# Patient Record
Sex: Male | Born: 1955 | Race: White | Hispanic: No | Smoking: Former smoker
Health system: Southern US, Community
[De-identification: ages and names within clinical notes are randomized; demographics above are authoritative.]

## PROBLEM LIST (undated history)

## (undated) DIAGNOSIS — N189 Chronic kidney disease, unspecified: Secondary | ICD-10-CM

## (undated) DIAGNOSIS — N529 Male erectile dysfunction, unspecified: Secondary | ICD-10-CM

## (undated) DIAGNOSIS — E039 Hypothyroidism, unspecified: Secondary | ICD-10-CM

## (undated) DIAGNOSIS — F32A Depression, unspecified: Secondary | ICD-10-CM

## (undated) DIAGNOSIS — T7840XA Allergy, unspecified, initial encounter: Secondary | ICD-10-CM

## (undated) DIAGNOSIS — F329 Major depressive disorder, single episode, unspecified: Secondary | ICD-10-CM

## (undated) DIAGNOSIS — Z9889 Other specified postprocedural states: Secondary | ICD-10-CM

## (undated) DIAGNOSIS — I10 Essential (primary) hypertension: Secondary | ICD-10-CM

## (undated) DIAGNOSIS — F419 Anxiety disorder, unspecified: Secondary | ICD-10-CM

## (undated) DIAGNOSIS — R011 Cardiac murmur, unspecified: Secondary | ICD-10-CM

## (undated) DIAGNOSIS — Z87442 Personal history of urinary calculi: Secondary | ICD-10-CM

## (undated) DIAGNOSIS — E785 Hyperlipidemia, unspecified: Secondary | ICD-10-CM

## (undated) DIAGNOSIS — A630 Anogenital (venereal) warts: Secondary | ICD-10-CM

## (undated) DIAGNOSIS — R112 Nausea with vomiting, unspecified: Secondary | ICD-10-CM

## (undated) DIAGNOSIS — K219 Gastro-esophageal reflux disease without esophagitis: Secondary | ICD-10-CM

## (undated) HISTORY — PX: TONSILLECTOMY: SUR1361

## (undated) HISTORY — DX: Chronic kidney disease, unspecified: N18.9

## (undated) HISTORY — DX: Allergy, unspecified, initial encounter: T78.40XA

## (undated) HISTORY — DX: Essential (primary) hypertension: I10

## (undated) HISTORY — DX: Gastro-esophageal reflux disease without esophagitis: K21.9

## (undated) HISTORY — DX: Anogenital (venereal) warts: A63.0

## (undated) HISTORY — DX: Male erectile dysfunction, unspecified: N52.9

## (undated) HISTORY — PX: APPENDECTOMY: SHX54

## (undated) HISTORY — DX: Major depressive disorder, single episode, unspecified: F32.9

## (undated) HISTORY — DX: Depression, unspecified: F32.A

## (undated) HISTORY — DX: Hyperlipidemia, unspecified: E78.5

## (undated) HISTORY — DX: Anxiety disorder, unspecified: F41.9

## (undated) HISTORY — PX: KNEE ARTHROSCOPY: SUR90

## (undated) HISTORY — PX: CYSTECTOMY: SUR359

---
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
## 1988-09-22 HISTORY — PX: WISDOM TOOTH EXTRACTION: SHX21

## 1991-09-23 HISTORY — PX: KNEE ARTHROSCOPY: SUR90

## 2003-04-07 ENCOUNTER — Emergency Department (HOSPITAL_COMMUNITY): Admission: EM | Admit: 2003-04-07 | Discharge: 2003-04-08 | Payer: Self-pay | Admitting: Emergency Medicine

## 2004-07-30 ENCOUNTER — Ambulatory Visit: Payer: Self-pay | Admitting: Family Medicine

## 2004-09-02 ENCOUNTER — Ambulatory Visit: Payer: Self-pay | Admitting: Family Medicine

## 2004-12-24 ENCOUNTER — Ambulatory Visit: Payer: Self-pay | Admitting: Family Medicine

## 2005-01-06 ENCOUNTER — Ambulatory Visit: Payer: Self-pay | Admitting: Family Medicine

## 2005-09-18 ENCOUNTER — Ambulatory Visit: Payer: Self-pay | Admitting: Family Medicine

## 2005-10-08 ENCOUNTER — Ambulatory Visit: Payer: Self-pay | Admitting: Family Medicine

## 2005-10-17 ENCOUNTER — Ambulatory Visit: Payer: Self-pay | Admitting: Family Medicine

## 2005-11-07 ENCOUNTER — Ambulatory Visit: Payer: Self-pay | Admitting: Family Medicine

## 2005-11-12 ENCOUNTER — Ambulatory Visit: Payer: Self-pay | Admitting: Family Medicine

## 2005-12-19 ENCOUNTER — Ambulatory Visit: Payer: Self-pay | Admitting: Internal Medicine

## 2006-01-23 ENCOUNTER — Ambulatory Visit: Payer: Self-pay | Admitting: Family Medicine

## 2006-02-17 ENCOUNTER — Ambulatory Visit: Payer: Self-pay | Admitting: Family Medicine

## 2007-06-02 DIAGNOSIS — F321 Major depressive disorder, single episode, moderate: Secondary | ICD-10-CM

## 2007-06-22 ENCOUNTER — Ambulatory Visit: Payer: Self-pay | Admitting: Family Medicine

## 2007-09-03 ENCOUNTER — Ambulatory Visit: Payer: Self-pay | Admitting: Family Medicine

## 2007-09-03 LAB — CONVERTED CEMR LAB
Bilirubin Urine: NEGATIVE
Blood in Urine, dipstick: NEGATIVE
Glucose, Urine, Semiquant: NEGATIVE
Ketones, urine, test strip: NEGATIVE
Nitrite: NEGATIVE
Protein, U semiquant: NEGATIVE
Specific Gravity, Urine: 1.02
Urobilinogen, UA: 0.2
WBC Urine, dipstick: NEGATIVE
pH: 7

## 2007-09-07 LAB — CONVERTED CEMR LAB
ALT: 24 units/L (ref 0–53)
AST: 25 units/L (ref 0–37)
Albumin: 4 g/dL (ref 3.5–5.2)
Alkaline Phosphatase: 59 units/L (ref 39–117)
BUN: 10 mg/dL (ref 6–23)
Basophils Absolute: 0 10*3/uL (ref 0.0–0.1)
Basophils Relative: 0.3 % (ref 0.0–1.0)
Bilirubin, Direct: 0.2 mg/dL (ref 0.0–0.3)
CO2: 30 meq/L (ref 19–32)
Calcium: 9.4 mg/dL (ref 8.4–10.5)
Chloride: 106 meq/L (ref 96–112)
Cholesterol: 213 mg/dL (ref 0–200)
Creatinine, Ser: 0.8 mg/dL (ref 0.4–1.5)
Direct LDL: 138.1 mg/dL
Eosinophils Absolute: 0.2 10*3/uL (ref 0.0–0.6)
Eosinophils Relative: 2.4 % (ref 0.0–5.0)
GFR calc Af Amer: 131 mL/min
GFR calc non Af Amer: 108 mL/min
Glucose, Bld: 100 mg/dL — ABNORMAL HIGH (ref 70–99)
HCT: 40.1 % (ref 39.0–52.0)
HDL: 45.4 mg/dL (ref >39.0)
Hemoglobin: 13.9 g/dL (ref 13.0–17.0)
Lymphocytes Relative: 33.6 % (ref 12.0–46.0)
MCHC: 34.8 g/dL (ref 30.0–36.0)
MCV: 96.2 fL (ref 78.0–100.0)
Monocytes Absolute: 0.4 10*3/uL (ref 0.2–0.7)
Monocytes Relative: 5.6 % (ref 3.0–11.0)
Neutro Abs: 4.2 10*3/uL (ref 1.4–7.7)
Neutrophils Relative %: 58.1 % (ref 43.0–77.0)
PSA: 1.36 ng/mL (ref 0.10–4.00)
Platelets: 285 10*3/uL (ref 150–400)
Potassium: 5.3 meq/L — ABNORMAL HIGH (ref 3.5–5.1)
RBC: 4.16 M/uL — ABNORMAL LOW (ref 4.22–5.81)
RDW: 12.7 % (ref 11.5–14.6)
Sodium: 143 meq/L (ref 135–145)
TSH: 1.26 microintl units/mL (ref 0.35–5.50)
Total Bilirubin: 1.2 mg/dL (ref 0.3–1.2)
Total CHOL/HDL Ratio: 4.7
Total Protein: 6.7 g/dL (ref 6.0–8.3)
Triglycerides: 99 mg/dL (ref 0–149)
VLDL: 20 mg/dL (ref 0–40)
WBC: 7.3 10*3/uL (ref 4.5–10.5)

## 2007-09-21 ENCOUNTER — Ambulatory Visit: Payer: Self-pay | Admitting: Family Medicine

## 2007-10-25 ENCOUNTER — Ambulatory Visit: Payer: Self-pay | Admitting: Gastroenterology

## 2007-11-08 ENCOUNTER — Encounter: Payer: Self-pay | Admitting: Family Medicine

## 2007-11-08 ENCOUNTER — Ambulatory Visit: Payer: Self-pay | Admitting: Gastroenterology

## 2007-11-08 HISTORY — PX: COLONOSCOPY: SHX174

## 2008-05-05 ENCOUNTER — Ambulatory Visit: Payer: Self-pay | Admitting: Family Medicine

## 2008-05-05 DIAGNOSIS — F411 Generalized anxiety disorder: Secondary | ICD-10-CM

## 2008-09-23 ENCOUNTER — Emergency Department (HOSPITAL_COMMUNITY): Admission: EM | Admit: 2008-09-23 | Discharge: 2008-09-24 | Payer: Self-pay | Admitting: Emergency Medicine

## 2008-09-23 ENCOUNTER — Telehealth: Payer: Self-pay | Admitting: Internal Medicine

## 2008-09-25 ENCOUNTER — Ambulatory Visit: Payer: Self-pay | Admitting: Family Medicine

## 2008-09-25 DIAGNOSIS — I10 Essential (primary) hypertension: Secondary | ICD-10-CM | POA: Insufficient documentation

## 2009-05-04 ENCOUNTER — Ambulatory Visit: Payer: Self-pay | Admitting: Family Medicine

## 2009-05-15 ENCOUNTER — Telehealth: Payer: Self-pay | Admitting: Family Medicine

## 2009-11-01 ENCOUNTER — Ambulatory Visit: Payer: Self-pay | Admitting: Internal Medicine

## 2010-01-01 ENCOUNTER — Emergency Department (HOSPITAL_COMMUNITY): Admission: EM | Admit: 2010-01-01 | Discharge: 2010-01-01 | Payer: Self-pay | Admitting: Emergency Medicine

## 2010-02-25 ENCOUNTER — Ambulatory Visit: Payer: Self-pay | Admitting: Internal Medicine

## 2010-02-28 ENCOUNTER — Telehealth: Payer: Self-pay | Admitting: Internal Medicine

## 2010-05-30 ENCOUNTER — Ambulatory Visit: Payer: Self-pay | Admitting: Family Medicine

## 2010-05-30 DIAGNOSIS — K219 Gastro-esophageal reflux disease without esophagitis: Secondary | ICD-10-CM | POA: Insufficient documentation

## 2010-05-30 DIAGNOSIS — L408 Other psoriasis: Secondary | ICD-10-CM | POA: Insufficient documentation

## 2010-09-17 ENCOUNTER — Encounter: Payer: Self-pay | Admitting: Family Medicine

## 2010-10-03 ENCOUNTER — Telehealth: Payer: Self-pay | Admitting: Family Medicine

## 2010-10-22 NOTE — Assessment & Plan Note (Signed)
Summary: congestion/?flu/njr   Vital Signs:  Patient profile:   55 year old male O2 Sat:      97 % on Room air Temp:     98.9 degrees F oral BP sitting:   140 / 86  (right arm) Cuff size:   regular  Vitals Entered By: Duard Brady LPN (November 01, 2009 2:41 PM)  O2 Flow:  Room air CC: c/o troat and nose congestion, sore throat , roomate seen 10 days ago by Dr. Clent Ridges - tx for flu   CC:  c/o troat and nose congestion, sore throat , and roomate seen 10 days ago by Dr. Clent Ridges - tx for flu.  History of Present Illness: 55 year old patient is seen today with a 3 week illness.  His roommate has been evaluated and treated for a flulike illness.  He was much sicker about two weeks ago, but presently has had some residual sore throat, chest and sinus congestion and hoarseness.  Denies any fever, productive cough, shortness of breath or chest pain.  He is on Flonase for allergic rhinitis. he has a history of borderline hypertension, presently controlled off medication  Allergies: No Known Drug Allergies  Past History:  Past Medical History: Reviewed history from 05/04/2009 and no changes required. Heart Murmur, resolved as a child Depression Allergies genital warts, saw Dr. Terri Piedra [facility] Hypertension ED  Review of Systems       The patient complains of anorexia and prolonged cough.  The patient denies fever, weight gain, vision loss, decreased hearing, hoarseness, chest pain, syncope, dyspnea on exertion, peripheral edema, headaches, hemoptysis, abdominal pain, melena, hematochezia, severe indigestion/heartburn, hematuria, incontinence, genital sores, muscle weakness, suspicious skin lesions, transient blindness, difficulty walking, depression, unusual weight change, abnormal bleeding, enlarged lymph nodes, angioedema, breast masses, and testicular masses.    Physical Exam  General:  Well-developed,well-nourished,in no acute distress; alert,appropriate and cooperative throughout  examination Head:  Normocephalic and atraumatic without obvious abnormalities. No apparent alopecia or balding. Eyes:  No corneal or conjunctival inflammation noted. EOMI. Perrla. Funduscopic exam benign, without hemorrhages, exudates or papilledema. Vision grossly normal. Ears:  External ear exam shows no significant lesions or deformities.  Otoscopic examination reveals clear canals, tympanic membranes are intact bilaterally without bulging, retraction, inflammation or discharge. Hearing is grossly normal bilaterally. Nose:  External nasal examination shows no deformity or inflammation. Nasal mucosa are pink and moist without lesions or exudates. Mouth:  Oral mucosa and oropharynx without lesions or exudates.  Teeth in good repair. Neck:  No deformities, masses, or tenderness noted. Lungs:  Normal respiratory effort, chest expands symmetrically. Lungs are clear to auscultation, no crackles or wheezes. Heart:  Normal rate and regular rhythm. S1 and S2 normal without gallop, murmur, click, rub or other extra sounds.   Impression & Recommendations:  Problem # 1:  URI (ICD-465.9)  His updated medication list for this problem includes:    Diclofenac Sodium 50 Mg Tbec (Diclofenac sodium) .Marland Kitchen... Three times a day as needed pain  Problem # 2:  HYPERTENSION (ICD-401.9)  Complete Medication List: 1)  Flonase 50 Mcg/act Susp (Fluticasone propionate) .... Once daily 2)  Diclofenac Sodium 50 Mg Tbec (Diclofenac sodium) .... Three times a day as needed pain 3)  Alprazolam 1 Mg Tbdp (Alprazolam) .Marland Kitchen.. 1 every 6 hours 4)  Cialis 20 Mg Tabs (Tadalafil) .... As needed  Patient Instructions: 1)  Get plenty of rest, drink lots of clear liquids, and use Tylenol or Ibuprofen for fever and comfort. Return in 7-10 days if  you're not better:sooner if you're feeling worse. Prescriptions: CIALIS 20 MG TABS (TADALAFIL) as needed  #10 x 11   Entered and Authorized by:   Gordy Savers  MD   Signed by:   Gordy Savers  MD on 11/01/2009   Method used:   Print then Give to Patient   RxID:   [id number] ALPRAZOLAM 1 MG  TBDP (ALPRAZOLAM) 1 every 6 hours  #60 x 5   Entered and Authorized by:   Gordy Savers  MD   Signed by:   Gordy Savers  MD on 11/01/2009   Method used:   Print then Give to Patient   RxID:   [id number] DICLOFENAC SODIUM 50 MG  TBEC (DICLOFENAC SODIUM) three times a day as needed pain  #90 x 3   Entered and Authorized by:   Gordy Savers  MD   Signed by:   Gordy Savers  MD on 11/01/2009   Method used:   Print then Give to Patient   RxID:   [id number] FLONASE 50 MCG/ACT  SUSP (FLUTICASONE PROPIONATE) once daily  #3 x 3   Entered and Authorized by:   Gordy Savers  MD   Signed by:   Gordy Savers  MD on 11/01/2009   Method used:   Print then Give to Patient   RxID:   [id number]

## 2010-10-22 NOTE — Assessment & Plan Note (Signed)
Summary: sinuses//ccm   Vital Signs:  Patient profile:   55 year old male O2 Sat:      98 % Temp:     98.8 degrees F Pulse rate:   68 / minute BP sitting:   130 / 92  (left arm) Cuff size:   regular  Vitals Entered By: Pura Spice, RN (May 30, 2010 11:15 AM) CC: sinus inf stuffy nose throat scratchy raw.   Contraindications/Deferment of Procedures/Staging:    Test/Procedure: Weight Refused    Reason for deferment: patient declined-cannot calculate BMI   History of Present Illness: Here for several reasons. First, for several days he has had sinus pressure, PND, ST, and a dry cough. No fever. second, he has frequent GERD and uses OTC meds like Prilosec. These do not help very much.  Third, he has psoriasis in his scalp, and it has started to show up on his forehead with itchy scales.   Allergies (verified): No Known Drug Allergies  Past History:  Past Medical History: Reviewed history from 05/04/2009 and no changes required. Heart Murmur, resolved as a child Depression Allergies genital warts, saw Dr. Terri Piedra Anxiety Hypertension ED  Review of Systems  The patient denies anorexia, fever, weight loss, weight gain, vision loss, decreased hearing, hoarseness, chest pain, syncope, dyspnea on exertion, peripheral edema, hemoptysis, abdominal pain, melena, hematochezia, hematuria, incontinence, genital sores, muscle weakness, suspicious skin lesions, transient blindness, difficulty walking, depression, unusual weight change, abnormal bleeding, enlarged lymph nodes, angioedema, breast masses, and testicular masses.    Physical Exam  General:  Well-developed,well-nourished,in no acute distress; alert,appropriate and cooperative throughout examination Head:  Normocephalic and atraumatic without obvious abnormalities. No apparent alopecia or balding. Eyes:  No corneal or conjunctival inflammation noted. EOMI. Perrla. Funduscopic exam benign, without hemorrhages, exudates or  papilledema. Vision grossly normal. Ears:  External ear exam shows no significant lesions or deformities.  Otoscopic examination reveals clear canals, tympanic membranes are intact bilaterally without bulging, retraction, inflammation or discharge. Hearing is grossly normal bilaterally. Nose:  External nasal examination shows no deformity or inflammation. Nasal mucosa are pink and moist without lesions or exudates. Mouth:  Oral mucosa and oropharynx without lesions or exudates.  Teeth in good repair. Neck:  No deformities, masses, or tenderness noted. Lungs:  Normal respiratory effort, chest expands symmetrically. Lungs are clear to auscultation, no crackles or wheezes. Abdomen:  Bowel sounds positive,abdomen soft and non-tender without masses, organomegaly or hernias noted. Skin:  scaly pink patches over the forehead   Impression & Recommendations:  Problem # 1:  ACUTE SINUSITIS, UNSPECIFIED (ICD-461.9)  His updated medication list for this problem includes:    Flonase 50 Mcg/act Susp (Fluticasone propionate) ..... Once daily    Zithromax Z-pak 250 Mg Tabs (Azithromycin) .Marland Kitchen... As directed  Problem # 2:  PSORIASIS (ICD-696.1)  Problem # 3:  GERD (ICD-530.81)  His updated medication list for this problem includes:    Omeprazole 40 Mg Cpdr (Omeprazole) ..... Once daily  Complete Medication List: 1)  Flonase 50 Mcg/act Susp (Fluticasone propionate) .... Once daily 2)  Diclofenac Sodium 50 Mg Tbec (Diclofenac sodium) .... Three times a day as needed pain 3)  Alprazolam 1 Mg Tbdp (Alprazolam) .Marland Kitchen.. 1 every 6 hours 4)  Cialis 20 Mg Tabs (Tadalafil) .... As needed 5)  Trazodone Hcl 50 Mg Tabs (Trazodone hcl) .... At bedtime 6)  Zithromax Z-pak 250 Mg Tabs (Azithromycin) .... As directed 7)  Omeprazole 40 Mg Cpdr (Omeprazole) .... Once daily 8)  Triamcinolone Acetonide  0.1 % Crea (Triamcinolone acetonide) .... Apply three times a day as needed  Patient Instructions: 1)  Please schedule a  follow-up appointment as needed .  Prescriptions: TRIAMCINOLONE ACETONIDE 0.1 % CREA (TRIAMCINOLONE ACETONIDE) apply three times a day as needed  #60 x 5   Entered and Authorized by:   Nelwyn Salisbury MD   Signed by:   Nelwyn Salisbury MD on 05/30/2010   Method used:   Electronically to        [facility] (retail)       [address]       [place], [place]  [address]       Ph: [phone]       Fax: [phone]   RxID:   [id number] OMEPRAZOLE 40 MG CPDR (OMEPRAZOLE) once daily  #30 x 11   Entered and Authorized by:   Nelwyn Salisbury MD   Signed by:   Nelwyn Salisbury MD on 05/30/2010   Method used:   Electronically to        [facility] (retail)       [address]       [place], [place]  [address]       Ph: [phone]       Fax: [phone]   RxID:   [id number] ZITHROMAX Z-PAK 250 MG TABS (AZITHROMYCIN) as directed  #1 x 0   Entered and Authorized by:   Nelwyn Salisbury MD   Signed by:   Nelwyn Salisbury MD on 05/30/2010   Method used:   Electronically to        [facility] (retail)       [address]       [place], [place]  [address]       Ph: [phone]       Fax: [phone]   RxID:   [id number]

## 2010-10-22 NOTE — Assessment & Plan Note (Signed)
Summary: ? poison ivy-oak//ccm   Vital Signs:  Patient profile:   55 year old male Temp:     98.1 degrees F oral BP sitting:   120 / 80  (left arm) Cuff size:   regular  Vitals Entered By: Duard Brady LPN (February 25, 346 1:48 PM) CC: c/o rash ?posion ivy?  Is Patient Diabetic? No   CC:  c/o rash ?posion ivy? Richard Sellers  History of Present Illness: 55 year old patient who has a history of hypertensionanxiety disorder, who presents with a several-day history of a rash due to contact dermatitis.  His blood pressure has been stable off medication.  Allergies (verified): No Known Drug Allergies  Past History:  Past Medical History: Reviewed history from 05/04/2009 and no changes required. Heart Murmur, resolved as a child Depression Allergies genital warts, saw Dr. Terri Piedra Anxiety Hypertension ED  Physical Exam  General:  Well-developed,well-nourished,in no acute distress; alert,appropriate and cooperative throughout examination; 124/82 Skin:  scattered patchy, erythematous lesions with some tiny blistering consistent with a contact dermatitis   Impression & Recommendations:  Problem # 1:  RHUS DERMATITIS (ICD-692.6)  Problem # 2:  HYPERTENSION (ICD-401.9)  Complete Medication List: 1)  Flonase 50 Mcg/act Susp (Fluticasone propionate) .... Once daily 2)  Diclofenac Sodium 50 Mg Tbec (Diclofenac sodium) .... Three times a day as needed pain 3)  Alprazolam 1 Mg Tbdp (Alprazolam) .Richard Sellers.. 1 every 6 hours 4)  Cialis 20 Mg Tabs (Tadalafil) .... As needed 5)  Trazodone Hcl 50 Mg Tabs (Trazodone hcl) .... At bedtime  Patient Instructions: 1)  Limit your Sodium (Salt) to less than 2 grams a day(slightly less than 1/2 a teaspoon) to prevent fluid retention, swelling, or worsening of symptoms. 2)  It is important that you exercise regularly at least 20 minutes 5 times a week. If you develop chest pain, have severe difficulty breathing, or feel very tired , stop exercising immediately  and seek medical attention. Prescriptions: TRAZODONE HCL 50 MG TABS (TRAZODONE HCL) at bedtime  #30 x 11   Entered and Authorized by:   Gordy Savers  MD   Signed by:   Gordy Savers  MD on 02/25/2010   Method used:   Print then Give to Patient   RxID:   [id number] CIALIS 20 MG TABS (TADALAFIL) as needed  #10 x 11   Entered and Authorized by:   Gordy Savers  MD   Signed by:   Gordy Savers  MD on 02/25/2010   Method used:   Print then Give to Patient   RxID:   [id number] ALPRAZOLAM 1 MG  TBDP (ALPRAZOLAM) 1 every 6 hours  #60 x 5   Entered and Authorized by:   Gordy Savers  MD   Signed by:   Gordy Savers  MD on 02/25/2010   Method used:   Print then Give to Patient   RxID:   [id number] DICLOFENAC SODIUM 50 MG  TBEC (DICLOFENAC SODIUM) three times a day as needed pain  #90 x 3   Entered and Authorized by:   Gordy Savers  MD   Signed by:   Gordy Savers  MD on 02/25/2010   Method used:   Print then Give to Patient   RxID:   [id number] FLONASE 50 MCG/ACT  SUSP (FLUTICASONE PROPIONATE) once daily  #3 x 3   Entered and Authorized by:   Gordy Savers  MD   Signed by:   Gordy Savers  MD  on 02/25/2010   Method used:   Print then Give to Patient   RxID:   [id number]

## 2010-10-22 NOTE — Progress Notes (Signed)
Summary: re for prednisone  Phone Note Call from Patient Call back at [phone]   Caller: Patient Summary of Call: Pt called to adv that he was just in the office on 02/25/2010... pt adv that he was treated for poison oak / ivy but pt states that his condition really hasn't improved... Pt adv that he is still exp redness, itching and it seems to be spreading... Pt wanted to know if something can be sent to [facility].... Pt was offered OV but refused stating that he was just in the office and didn't want to pay another co-pay, just wants to know if something can be sent into pharmacy to help with sxs.  Initial call taken by: Debbra Riding,  February 28, 2010 8:44 AM  Follow-up for Phone Call        prednisone dose pacj (generic) 10 mg 12 day Follow-up by: Gordy Savers  MD,  February 28, 2010 12:52 PM  Additional Follow-up for Phone Call Additional follow up Details #1::        change to med list , faxed to pharm , called pt - ans mach - LMTCB if questions rx called in. KIK Additional Follow-up by: Duard Brady LPN,  February 29, 980 1:47 PM    New/Updated Medications: PREDNISONE (PAK) 10 MG TABS (PREDNISONE) as directed for 12 days Prescriptions: PREDNISONE (PAK) 10 MG TABS (PREDNISONE) as directed for 12 days  #1 x 0   Entered by:   Duard Brady LPN   Authorized by:   Gordy Savers  MD   Signed by:   Duard Brady LPN on 19/14/7829   Method used:   Faxed to ...       [facility] (retail)       [address]       [place], [place]  [address]       Ph: [phone]       Fax: [phone]   RxID:   [id number]

## 2010-10-24 NOTE — Miscellaneous (Signed)
Summary: flu vaccine  Clinical Lists Changes  Observations: Added new observation of FLU VAX: Historical (09/05/2010 14:14)      Immunization History:  Influenza Immunization History:    Influenza:  Historical (09/05/2010) was given at [facility]

## 2010-10-24 NOTE — Progress Notes (Signed)
Summary: rx alprazolam   Phone Note From Pharmacy   Caller: [facility] Summary of Call: refill alprazolam  Initial call taken by: Pura Spice, RN,  October 03, 2010 1:35 PM  Follow-up for Phone Call        call in #60 with 5 rf Follow-up by: Nelwyn Salisbury MD,  October 04, 2010 8:38 AM  Additional Follow-up for Phone Call Additional follow up Details #1::        done Additional Follow-up by: Pura Spice, RN,  October 04, 2010 10:38 AM    Prescriptions: ALPRAZOLAM 1 MG  TBDP (ALPRAZOLAM) 1 every 6 hours  #60 x 5   Entered by:   Pura Spice, RN   Authorized by:   Nelwyn Salisbury MD   Signed by:   Pura Spice, RN on 10/04/2010   Method used:   Telephoned to ...       [facility] (retail)       [address]       [place], [place]  [address]       Ph: [phone]       Fax: [phone]   RxID:   [id number]

## 2010-12-03 ENCOUNTER — Ambulatory Visit (INDEPENDENT_AMBULATORY_CARE_PROVIDER_SITE_OTHER): Payer: 59 | Admitting: Family Medicine

## 2010-12-03 ENCOUNTER — Encounter: Payer: Self-pay | Admitting: Family Medicine

## 2010-12-03 VITALS — BP 140/100 | HR 94 | Temp 98.3°F

## 2010-12-03 DIAGNOSIS — M545 Low back pain, unspecified: Secondary | ICD-10-CM

## 2010-12-03 DIAGNOSIS — E669 Obesity, unspecified: Secondary | ICD-10-CM

## 2010-12-03 MED ORDER — PHENTERMINE HCL 37.5 MG PO CAPS: 37.5000 mg | ORAL_CAPSULE | ORAL | Status: DC

## 2010-12-03 MED ORDER — HYDROCODONE-ACETAMINOPHEN 5-500 MG PO TABS: 1.0000 | ORAL_TABLET | Freq: Four times a day (QID) | ORAL | Status: AC | PRN

## 2010-12-03 NOTE — Progress Notes (Signed)
  Subjective:    Patient ID: Richard Sellers, male    DOB: 02-Jun-1956, 55 y.o.   MRN: [id number]  HPI Here for refills on pain meds, since his lower back pain has flared up again. He realizes this is probably the result of gaining weight, and he has put on a lot of weight. He does not exercise as much as he used to, and he is eating too much. He asks for something to help suppress his appetite.    Review of Systems  Constitutional: Positive for unexpected weight change.  Musculoskeletal: Positive for back pain.       Objective:   Physical Exam  Constitutional: He appears well-developed and well-nourished.       He has put on a lot of weight   Musculoskeletal: Normal range of motion. He exhibits no edema and no tenderness.          Assessment & Plan:  Use Phentermine for 6 months. Exercise regularly.

## 2010-12-11 LAB — URINALYSIS, ROUTINE W REFLEX MICROSCOPIC
Bilirubin Urine: NEGATIVE
Glucose, UA: NEGATIVE mg/dL
Leukocytes, UA: NEGATIVE
Nitrite: NEGATIVE
Protein, ur: NEGATIVE mg/dL
Specific Gravity, Urine: 1.022 (ref 1.005–1.030)
Urobilinogen, UA: 0.2 mg/dL (ref 0.0–1.0)
pH: 5 (ref 5.0–8.0)

## 2010-12-11 LAB — BASIC METABOLIC PANEL WITH GFR
BUN: 16 mg/dL (ref 6–23)
CO2: 23 meq/L (ref 19–32)
Calcium: 8.8 mg/dL (ref 8.4–10.5)
Chloride: 108 meq/L (ref 96–112)
Creatinine, Ser: 1.08 mg/dL (ref 0.4–1.5)
GFR calc Af Amer: >60 mL/min (ref >60)
GFR calc non Af Amer: >60 mL/min (ref >60)
Glucose, Bld: 148 mg/dL — ABNORMAL HIGH (ref 70–99)
Potassium: 3.9 meq/L (ref 3.5–5.1)
Sodium: 140 meq/L (ref 135–145)

## 2010-12-11 LAB — CBC
HCT: 38.2 % — ABNORMAL LOW (ref 39.0–52.0)
Hemoglobin: 13.1 g/dL (ref 13.0–17.0)
MCHC: 34.4 g/dL (ref 30.0–36.0)
MCV: 94.7 fL (ref 78.0–100.0)
Platelets: 286 K/uL (ref 150–400)
RBC: 4.03 MIL/uL — ABNORMAL LOW (ref 4.22–5.81)
RDW: 13.4 % (ref 11.5–15.5)
WBC: 11.6 K/uL — ABNORMAL HIGH (ref 4.0–10.5)

## 2010-12-11 LAB — URINE MICROSCOPIC-ADD ON

## 2010-12-11 LAB — DIFFERENTIAL
Basophils Absolute: 0 K/uL (ref 0.0–0.1)
Basophils Relative: 0 % (ref 0–1)
Eosinophils Absolute: 0.1 K/uL (ref 0.0–0.7)
Eosinophils Relative: 1 % (ref 0–5)
Lymphocytes Relative: 12 % (ref 12–46)
Lymphs Abs: 1.4 K/uL (ref 0.7–4.0)
Monocytes Absolute: 0.4 K/uL (ref 0.1–1.0)
Monocytes Relative: 4 % (ref 3–12)
Neutro Abs: 9.6 K/uL — ABNORMAL HIGH (ref 1.7–7.7)
Neutrophils Relative %: 83 % — ABNORMAL HIGH (ref 43–77)

## 2011-01-06 LAB — DIFFERENTIAL
Basophils Absolute: 0 10*3/uL (ref 0.0–0.1)
Basophils Relative: 1 % (ref 0–1)
Eosinophils Absolute: 0.2 10*3/uL (ref 0.0–0.7)
Eosinophils Relative: 3 % (ref 0–5)
Lymphocytes Relative: 29 % (ref 12–46)
Lymphs Abs: 2 10*3/uL (ref 0.7–4.0)
Monocytes Absolute: 0.4 10*3/uL (ref 0.1–1.0)
Monocytes Relative: 6 % (ref 3–12)
Neutro Abs: 4.3 10*3/uL (ref 1.7–7.7)
Neutrophils Relative %: 62 % (ref 43–77)

## 2011-01-06 LAB — CBC
HCT: 38.9 % — ABNORMAL LOW (ref 39.0–52.0)
Hemoglobin: 13.4 g/dL (ref 13.0–17.0)
MCHC: 34.4 g/dL (ref 30.0–36.0)
MCV: 93.3 fL (ref 78.0–100.0)
Platelets: 280 10*3/uL (ref 150–400)
RBC: 4.17 MIL/uL — ABNORMAL LOW (ref 4.22–5.81)
RDW: 13.5 % (ref 11.5–15.5)
WBC: 6.9 10*3/uL (ref 4.0–10.5)

## 2011-01-06 LAB — POCT I-STAT, CHEM 8
BUN: 13 mg/dL (ref 6–23)
Calcium, Ion: 1.23 mmol/L (ref 1.12–1.32)
Chloride: 106 mEq/L (ref 96–112)
Creatinine, Ser: 0.9 mg/dL (ref 0.4–1.5)
Glucose, Bld: 101 mg/dL — ABNORMAL HIGH (ref 70–99)
HCT: 41 % (ref 39.0–52.0)
Hemoglobin: 13.9 g/dL (ref 13.0–17.0)
Potassium: 4.4 mEq/L (ref 3.5–5.1)
Sodium: 142 mEq/L (ref 135–145)
TCO2: 25 mmol/L (ref 0–100)

## 2011-02-13 ENCOUNTER — Other Ambulatory Visit: Payer: Self-pay

## 2011-02-13 MED ORDER — DICLOFENAC SODIUM 50 MG PO TBEC: 50.0000 mg | DELAYED_RELEASE_TABLET | Freq: Three times a day (TID) | ORAL | Status: DC | PRN

## 2011-02-13 NOTE — Telephone Encounter (Signed)
Faxed back to [facility] 

## 2011-03-25 ENCOUNTER — Ambulatory Visit (INDEPENDENT_AMBULATORY_CARE_PROVIDER_SITE_OTHER): Payer: Commercial Indemnity | Admitting: Family Medicine

## 2011-03-25 ENCOUNTER — Encounter: Payer: Self-pay | Admitting: Family Medicine

## 2011-03-25 DIAGNOSIS — L408 Other psoriasis: Secondary | ICD-10-CM

## 2011-03-25 DIAGNOSIS — L409 Psoriasis, unspecified: Secondary | ICD-10-CM

## 2011-03-25 MED ORDER — HALOBETASOL PROPIONATE 0.05 % EX CREA: TOPICAL_CREAM | Freq: Two times a day (BID) | CUTANEOUS | Status: AC

## 2011-03-25 MED ORDER — PHENTERMINE HCL 37.5 MG PO CAPS: 37.5000 mg | ORAL_CAPSULE | ORAL | Status: DC

## 2011-03-25 NOTE — Progress Notes (Signed)
  Subjective:    Patient ID: Richard Sellers, male    DOB: April 20, 1956, 55 y.o.   MRN: [id number]  HPI Here for the psoriasis on his face and scalp. Triamcinolone cream does not control this as well as it used to. It burns and itches. 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 He has been wearing a ball cap, but this makes his head hot and sweaty and this makes the psoriasis worse. He has good luck when he wears a light bouffant hat, but his employers will not supply these for him unless he has a doctor note to say he needs them.    Review of Systems  Constitutional: Negative.   Skin: Positive for rash.       Objective:   Physical Exam  Constitutional: He appears well-developed and well-nourished.  Skin:       Red, macular, scaly rash around the hairlines and the ears          Assessment & Plan:  Switch  to Ultravate cream tid prn . I wrote a note saying that it is medically necessary for him to wear bouffant hats at work.

## 2011-04-15 ENCOUNTER — Other Ambulatory Visit: Payer: Self-pay | Admitting: Family Medicine

## 2011-04-16 NOTE — Telephone Encounter (Signed)
Refill request for Alprazolam, pt last here on 03/25/11 and script last filled on 02/04/11.

## 2011-04-16 NOTE — Telephone Encounter (Signed)
Call in #60 with 5 rf 

## 2011-04-17 NOTE — Telephone Encounter (Signed)
I called in script 

## 2011-05-02 ENCOUNTER — Telehealth: Payer: Self-pay | Admitting: Family Medicine

## 2011-05-02 MED ORDER — FLUTICASONE PROPIONATE 50 MCG/ACT NA SUSP: 2.0000 | Freq: Every day | NASAL | Status: DC

## 2011-05-02 NOTE — Telephone Encounter (Signed)
Script sent e-scribe 

## 2011-06-04 ENCOUNTER — Telehealth: Payer: Self-pay | Admitting: Family Medicine

## 2011-06-04 MED ORDER — OMEPRAZOLE 40 MG PO CPDR: 40.0000 mg | DELAYED_RELEASE_CAPSULE | Freq: Every day | ORAL | Status: DC

## 2011-06-04 NOTE — Telephone Encounter (Signed)
Refill request for Omeprazole 40 mg. Script sent e-scribe

## 2011-09-01 ENCOUNTER — Telehealth: Payer: Self-pay | Admitting: Family Medicine

## 2011-09-01 MED ORDER — AZITHROMYCIN 250 MG PO TABS: ORAL_TABLET | ORAL | Status: DC

## 2011-09-01 NOTE — Telephone Encounter (Signed)
He is complaining of one week of sinus pressure, PND, and coughing. No fever.

## 2011-11-17 ENCOUNTER — Other Ambulatory Visit: Payer: Self-pay | Admitting: Internal Medicine

## 2011-11-17 NOTE — Telephone Encounter (Signed)
Dr Fry pt 

## 2011-11-18 ENCOUNTER — Telehealth: Payer: Self-pay | Admitting: Family Medicine

## 2011-11-18 MED ORDER — ALPRAZOLAM 1 MG PO TABS: 1.0000 mg | ORAL_TABLET | Freq: Four times a day (QID) | ORAL | Status: DC | PRN

## 2011-11-18 NOTE — Telephone Encounter (Signed)
Call in #60 with 5 rf 

## 2011-11-18 NOTE — Telephone Encounter (Signed)
Refill request for Alprazolam 1 mg take 1 po q6hrs prn and pt last here on 03/25/11.

## 2011-11-18 NOTE — Telephone Encounter (Signed)
Script called in

## 2012-03-19 ENCOUNTER — Ambulatory Visit (INDEPENDENT_AMBULATORY_CARE_PROVIDER_SITE_OTHER): Payer: Commercial Indemnity | Admitting: Family Medicine

## 2012-03-19 ENCOUNTER — Encounter: Payer: Self-pay | Admitting: Family Medicine

## 2012-03-19 VITALS — BP 148/90 | HR 114 | Temp 98.9°F

## 2012-03-19 DIAGNOSIS — M545 Low back pain, unspecified: Secondary | ICD-10-CM

## 2012-03-19 DIAGNOSIS — F419 Anxiety disorder, unspecified: Secondary | ICD-10-CM

## 2012-03-19 DIAGNOSIS — F32A Depression, unspecified: Secondary | ICD-10-CM

## 2012-03-19 DIAGNOSIS — F341 Dysthymic disorder: Secondary | ICD-10-CM

## 2012-03-19 MED ORDER — ALPRAZOLAM 1 MG PO TABS: 1.0000 mg | ORAL_TABLET | Freq: Four times a day (QID) | ORAL | Status: DC | PRN

## 2012-03-19 MED ORDER — ESCITALOPRAM OXALATE 10 MG PO TABS: 10.0000 mg | ORAL_TABLET | Freq: Every day | ORAL | Status: DC

## 2012-03-19 MED ORDER — HYDROCODONE-ACETAMINOPHEN 5-500 MG PO TABS: 1.0000 | ORAL_TABLET | Freq: Four times a day (QID) | ORAL | Status: AC | PRN

## 2012-03-19 MED ORDER — PHENTERMINE HCL 37.5 MG PO CAPS: 37.5000 mg | ORAL_CAPSULE | ORAL | Status: DC

## 2012-03-29 ENCOUNTER — Encounter: Payer: Self-pay | Admitting: Family Medicine

## 2012-03-29 NOTE — Progress Notes (Signed)
  Subjective:    Patient ID: Richard Sellers, male    DOB: [birth year], 56 y.o.   MRN: [id number]  HPI Here to discuss anxiety resulting from a recent breakup with his partner and roommate. They have been together for the past 10 years or so, and his partner recently told him that he has been seeing someone else and that he wants to breakup. They have had several intense arguments this past week. Now Richard Sellers is very upset, depressed, tearful, and distraught. He says he does not know what to do. He has been taking some Xanax but he cannot eat or sleep. This stress has also worsened his back pain, and he asks for some meds for this.    Review of Systems  Constitutional: Negative.   Respiratory: Negative.   Cardiovascular: Negative.   Musculoskeletal: Positive for back pain.  Psychiatric/Behavioral: Positive for disturbed wake/sleep cycle, dysphoric mood, decreased concentration and agitation. Negative for hallucinations and confusion. The patient is nervous/anxious.        Objective:   Physical Exam  Constitutional: He appears well-developed and well-nourished.  Musculoskeletal: Normal range of motion. He exhibits no edema and no tenderness.  Psychiatric: His behavior is normal. Judgment and thought content normal.       Tearful, very anxious          Assessment & Plan:  We will start him on Lexapro at 10 mg a day, and he may add Xanax prn. Use Vicodin for the back pain. Given a note to miss work the rest of this weekend. Recheck in 3 weeks

## 2012-04-20 ENCOUNTER — Encounter: Payer: Self-pay | Admitting: Family Medicine

## 2012-04-20 ENCOUNTER — Ambulatory Visit (INDEPENDENT_AMBULATORY_CARE_PROVIDER_SITE_OTHER): Payer: Commercial Indemnity | Admitting: Family Medicine

## 2012-04-20 VITALS — BP 142/90 | HR 108 | Temp 98.7°F

## 2012-04-20 DIAGNOSIS — M722 Plantar fascial fibromatosis: Secondary | ICD-10-CM

## 2012-04-20 DIAGNOSIS — F419 Anxiety disorder, unspecified: Secondary | ICD-10-CM

## 2012-04-20 DIAGNOSIS — F411 Generalized anxiety disorder: Secondary | ICD-10-CM

## 2012-04-20 DIAGNOSIS — B351 Tinea unguium: Secondary | ICD-10-CM

## 2012-04-20 MED ORDER — TERBINAFINE HCL 250 MG PO TABS: 250.0000 mg | ORAL_TABLET | Freq: Every day | ORAL | Status: AC

## 2012-04-20 NOTE — Progress Notes (Signed)
  Subjective:    Patient ID: Richard Sellers, male    DOB: 29-Aug-1956, 56 y.o.   MRN: [id number]  HPI Here for several issues. We saw him a month ago for acute anxiety over a breakup with his partner. He took Lexapro for one week and then stopped it. He feels much better and his anxiety is better controlled. He and his partner have worked some things out and they are still living together. Now he asks me to look at a non-tender knot on the arch of his right foot, which he noticed a week ago. It does not bother him at all. Also he asks to treat a fungal infection in the toenails.    Review of Systems  Constitutional: Negative.   Psychiatric/Behavioral: Negative.        Objective:   Physical Exam  Constitutional: He appears well-developed and well-nourished.  Musculoskeletal:       The arch of the right foot has a firm , non-tender, immobile nodule   Skin:       The right great toenail is thickened and yellow  Psychiatric: He has a normal mood and affect. His behavior is normal. Thought content normal.          Assessment & Plan:  Hi anxiety is controlled with prn Xanax only. I reassured him the calcium deposit on the sole is benign and did not require treatment unless it became painful. Try Terbinafine for the toenails.

## 2012-05-29 ENCOUNTER — Other Ambulatory Visit: Payer: Self-pay | Admitting: Family Medicine

## 2012-07-01 ENCOUNTER — Other Ambulatory Visit: Payer: Self-pay | Admitting: Family Medicine

## 2012-07-08 ENCOUNTER — Telehealth: Payer: Self-pay | Admitting: Family Medicine

## 2012-07-08 MED ORDER — DICLOFENAC SODIUM 50 MG PO TBEC: 50.0000 mg | DELAYED_RELEASE_TABLET | Freq: Three times a day (TID) | ORAL | Status: DC

## 2012-07-08 NOTE — Telephone Encounter (Signed)
I sent script e-scribe. 

## 2012-07-08 NOTE — Telephone Encounter (Signed)
Call in #120 with 5 rf 

## 2012-07-08 NOTE — Telephone Encounter (Signed)
Refill request for Alprazolam 1 mg take 1 po qid prn and last here on 04/20/12.

## 2012-07-09 MED ORDER — ALPRAZOLAM 1 MG PO TABS: 1.0000 mg | ORAL_TABLET | Freq: Four times a day (QID) | ORAL | Status: DC | PRN

## 2012-07-09 NOTE — Telephone Encounter (Signed)
I called in script 

## 2012-08-03 ENCOUNTER — Ambulatory Visit (INDEPENDENT_AMBULATORY_CARE_PROVIDER_SITE_OTHER): Payer: Commercial Indemnity | Admitting: Family Medicine

## 2012-08-03 ENCOUNTER — Encounter: Payer: Self-pay | Admitting: Family Medicine

## 2012-08-03 VITALS — BP 138/90 | HR 73 | Temp 98.6°F

## 2012-08-03 DIAGNOSIS — J329 Chronic sinusitis, unspecified: Secondary | ICD-10-CM

## 2012-08-03 MED ORDER — AZITHROMYCIN 250 MG PO TABS: ORAL_TABLET | ORAL | Status: AC

## 2012-08-03 MED ORDER — PHENTERMINE HCL 37.5 MG PO CAPS: 37.5000 mg | ORAL_CAPSULE | ORAL | Status: DC

## 2012-08-03 MED ORDER — HYDROCODONE-HOMATROPINE 5-1.5 MG/5ML PO SYRP: 5.0000 mL | ORAL_SOLUTION | ORAL | Status: AC | PRN

## 2012-08-03 NOTE — Progress Notes (Signed)
  Subjective:    Patient ID: Richard Sellers, male    DOB: [birth year], 56 y.o.   MRN: [id number]  HPI Here for one week of sinus pressure, HA, PND, and coughing up yellow sputum. No fever. Using decongestants.    Review of Systems  Constitutional: Negative.   HENT: Positive for congestion, postnasal drip and sinus pressure.   Eyes: Negative.   Respiratory: Positive for cough.        Objective:   Physical Exam  Constitutional: He appears well-developed and well-nourished.  HENT:  Right Ear: External ear normal.  Left Ear: External ear normal.  Nose: Nose normal.  Mouth/Throat: Oropharynx is clear and moist.  Eyes: Conjunctivae normal are normal.  Pulmonary/Chest: Effort normal and breath sounds normal.  Lymphadenopathy:    He has no cervical adenopathy.          Assessment & Plan:  Recheck prn

## 2012-08-30 ENCOUNTER — Other Ambulatory Visit: Payer: Self-pay | Admitting: Family Medicine

## 2013-02-22 ENCOUNTER — Telehealth: Payer: Self-pay | Admitting: Family Medicine

## 2013-02-22 NOTE — Telephone Encounter (Signed)
Call in #120 with 5 rf 

## 2013-02-22 NOTE — Telephone Encounter (Signed)
Refill request for Alprazolam 1 mg take 1 po qid.

## 2013-02-23 MED ORDER — ALPRAZOLAM 1 MG PO TABS: 1.0000 mg | ORAL_TABLET | Freq: Four times a day (QID) | ORAL | Status: DC | PRN

## 2013-02-23 NOTE — Telephone Encounter (Signed)
Script was called in.

## 2013-07-28 ENCOUNTER — Other Ambulatory Visit: Payer: Self-pay

## 2013-09-01 ENCOUNTER — Encounter: Payer: Self-pay | Admitting: Family Medicine

## 2013-09-01 ENCOUNTER — Ambulatory Visit (INDEPENDENT_AMBULATORY_CARE_PROVIDER_SITE_OTHER): Payer: Commercial Indemnity | Admitting: Family Medicine

## 2013-09-01 VITALS — BP 146/88 | HR 86 | Temp 98.9°F

## 2013-09-01 DIAGNOSIS — I10 Essential (primary) hypertension: Secondary | ICD-10-CM

## 2013-09-01 DIAGNOSIS — H60399 Other infective otitis externa, unspecified ear: Secondary | ICD-10-CM

## 2013-09-01 DIAGNOSIS — H6002 Abscess of left external ear: Secondary | ICD-10-CM

## 2013-09-01 DIAGNOSIS — F411 Generalized anxiety disorder: Secondary | ICD-10-CM

## 2013-09-01 MED ORDER — DICLOFENAC SODIUM 50 MG PO TBEC: 50.0000 mg | DELAYED_RELEASE_TABLET | Freq: Three times a day (TID) | ORAL | Status: DC

## 2013-09-01 MED ORDER — ALPRAZOLAM 1 MG PO TABS: 1.0000 mg | ORAL_TABLET | Freq: Four times a day (QID) | ORAL | Status: DC | PRN

## 2013-09-01 MED ORDER — HALOBETASOL PROPIONATE 0.05 % EX CREA: TOPICAL_CREAM | CUTANEOUS | Status: DC

## 2013-09-01 MED ORDER — PHENTERMINE HCL 37.5 MG PO CAPS: 37.5000 mg | ORAL_CAPSULE | ORAL | Status: DC

## 2013-09-01 MED ORDER — FLUTICASONE PROPIONATE 50 MCG/ACT NA SUSP: NASAL | Status: DC

## 2013-09-01 MED ORDER — DOXYCYCLINE HYCLATE 100 MG PO CAPS: 100.0000 mg | ORAL_CAPSULE | Freq: Two times a day (BID) | ORAL | Status: AC

## 2013-09-01 NOTE — Progress Notes (Signed)
   Subjective:    Patient ID: Richard Sellers, male    DOB: Apr 21, 1956, 57 y.o.   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 He had this pierced about 6 months ago, and it did well until about 2 months ago when it swelled up, became red and warm, and became painful. He saw his dentist 6 weeks ago and was given Augmentin. This cleared the ear up but one week ago it became tender and swollen again. No fevers. He also needs refills on his other meds.    Review of Systems  Constitutional: Negative.   Skin: Positive for wound.  Psychiatric/Behavioral: Negative.        Objective:   Physical Exam  Constitutional: He appears well-developed and well-nourished.  HENT:  There is a tender fluctuant cystic area on the left ear lobe   Lymphadenopathy:    He has no cervical adenopathy.          Assessment & Plan:  We were able to express some purulent fluid from the ear lobe with pressure and a culture was obtained. Treat with warm compresses and Doxycycline. Refilled other meds

## 2013-09-01 NOTE — Progress Notes (Signed)
Pre visit review using our clinic review tool, if applicable. No additional management support is needed unless otherwise documented below in the visit note. 

## 2013-09-06 LAB — WOUND CULTURE
Gram Stain: NONE SEEN
Gram Stain: NONE SEEN

## 2013-10-18 ENCOUNTER — Ambulatory Visit (INDEPENDENT_AMBULATORY_CARE_PROVIDER_SITE_OTHER): Payer: Commercial Indemnity | Admitting: Family Medicine

## 2013-10-18 ENCOUNTER — Encounter: Payer: Self-pay | Admitting: Family Medicine

## 2013-10-18 VITALS — BP 144/86 | HR 86 | Temp 98.3°F

## 2013-10-18 DIAGNOSIS — R5383 Other fatigue: Secondary | ICD-10-CM

## 2013-10-18 DIAGNOSIS — H6010 Cellulitis of external ear, unspecified ear: Secondary | ICD-10-CM

## 2013-10-18 DIAGNOSIS — H60399 Other infective otitis externa, unspecified ear: Secondary | ICD-10-CM

## 2013-10-18 DIAGNOSIS — L989 Disorder of the skin and subcutaneous tissue, unspecified: Secondary | ICD-10-CM

## 2013-10-18 DIAGNOSIS — R5381 Other malaise: Secondary | ICD-10-CM

## 2013-10-18 LAB — TESTOSTERONE: Testosterone: 307.24 ng/dL — ABNORMAL LOW (ref 350.00–890.00)

## 2013-10-18 LAB — CBC WITH DIFFERENTIAL/PLATELET
Basophils Absolute: 0 10*3/uL (ref 0.0–0.1)
Basophils Relative: 0.3 % (ref 0.0–3.0)
Eosinophils Absolute: 0.1 10*3/uL (ref 0.0–0.7)
Eosinophils Relative: 0.9 % (ref 0.0–5.0)
HCT: 45.3 % (ref 39.0–52.0)
Hemoglobin: 15.6 g/dL (ref 13.0–17.0)
Lymphocytes Relative: 23.5 % (ref 12.0–46.0)
Lymphs Abs: 2.3 10*3/uL (ref 0.7–4.0)
MCHC: 34.3 g/dL (ref 30.0–36.0)
MCV: 92.3 fl (ref 78.0–100.0)
Monocytes Absolute: 0.5 10*3/uL (ref 0.1–1.0)
Monocytes Relative: 5.5 % (ref 3.0–12.0)
Neutro Abs: 6.9 10*3/uL (ref 1.4–7.7)
Neutrophils Relative %: 69.8 % (ref 43.0–77.0)
Platelets: 338 10*3/uL (ref 150.0–400.0)
RBC: 4.91 Mil/uL (ref 4.22–5.81)
RDW: 13.9 % (ref 11.5–14.6)
WBC: 9.8 10*3/uL (ref 4.5–10.5)

## 2013-10-18 LAB — TSH: TSH: 1.91 u[IU]/mL (ref 0.35–5.50)

## 2013-10-18 LAB — HEPATIC FUNCTION PANEL
ALT: 25 U/L (ref 0–53)
AST: 26 U/L (ref 0–37)
Albumin: 4.2 g/dL (ref 3.5–5.2)
Alkaline Phosphatase: 84 U/L (ref 39–117)
Bilirubin, Direct: 0.1 mg/dL (ref 0.0–0.3)
Total Bilirubin: 1 mg/dL (ref 0.3–1.2)
Total Protein: 7.9 g/dL (ref 6.0–8.3)

## 2013-10-18 LAB — BASIC METABOLIC PANEL
BUN: 14 mg/dL (ref 6–23)
CO2: 27 mEq/L (ref 19–32)
Calcium: 9.3 mg/dL (ref 8.4–10.5)
Chloride: 106 mEq/L (ref 96–112)
Creatinine, Ser: 0.9 mg/dL (ref 0.4–1.5)
GFR: 98.7 mL/min (ref >60.00)
Glucose, Bld: 99 mg/dL (ref 70–99)
Potassium: 4.7 mEq/L (ref 3.5–5.1)
Sodium: 141 mEq/L (ref 135–145)

## 2013-10-18 MED ORDER — LEVOFLOXACIN 500 MG PO TABS: 500.0000 mg | ORAL_TABLET | Freq: Every day | ORAL | Status: AC

## 2013-10-18 NOTE — Progress Notes (Signed)
Pre visit review using our clinic review tool, if applicable. No additional management support is needed unless otherwise documented below in the visit note. 

## 2013-10-18 NOTE — Progress Notes (Signed)
   Subjective:    Patient ID: Richard Sellers, male    DOB: 1955/11/09, 58 y.o.   MRN: [id number]  HPI Here for continued infection in the left ear lobe. This started about 3 months ago. He has taken Augmentin and Doxycycline, and each time this improves but then flares up again. Now for a week the lobe is swollen, tender, and draining fluid. No fever. We obtained a culture of this at his last visit which grew non-MRSA staphylococcus.    Review of Systems  Constitutional: Negative.   Skin: Positive for wound.       Objective:   Physical Exam  Constitutional: He appears well-developed and well-nourished.  Skin:  The left ear lobe is swollen and red, it is tender, and there is a large abscess which is draining purulent fluid.           Assessment & Plan:  Treat with Levaquin. This probably needs surgical intervention so we will refer him to ENT.

## 2013-10-25 MED ORDER — TESTOSTERONE 20.25 MG/1.25GM (1.62%) TD GEL: 4.0000 "application " | Freq: Every day | TRANSDERMAL | Status: DC

## 2013-10-25 NOTE — Addendum Note (Signed)
Addended by: Aggie Hacker A on: 10/25/2013 11:56 AM   Modules accepted: Orders

## 2013-10-31 ENCOUNTER — Telehealth: Payer: Self-pay | Admitting: Family Medicine

## 2013-10-31 NOTE — Telephone Encounter (Signed)
Pt calling about andgrogel, 2 bottles were prescribed, however insurance will only let him fill one at a time, patient has been instructed to do 4 pumps a day meaning he will need 2 vials instead of 1.  Pt states that pharmacy instructed pt to call back to office so that we can get in contact with insurance company to approve 2 bottles instead of 1. Please advise. Tonya I am routing this to you and Sunday Spillers because I am not sure if a new rx needs to be written as well.  Thanks.

## 2013-11-01 NOTE — Telephone Encounter (Signed)
Does it require a new RX?? Or does the current amount prescribe cover the 2 vials per 30 days??

## 2013-11-01 NOTE — Telephone Encounter (Signed)
I spoke with pharmacy and the script is ready. Pt can either choose to use the coupon he has or run through on his insurance. The coupon will not cover for a 30 day supply and pt will need the 150 grams to use 4 pumps per day.

## 2013-11-01 NOTE — Telephone Encounter (Signed)
Are you working on a prior authorization for this? 

## 2013-11-07 ENCOUNTER — Telehealth: Payer: Self-pay | Admitting: Family Medicine

## 2013-11-07 NOTE — Telephone Encounter (Signed)
I spoke with pharmacy and they are going to look into this and call us back.

## 2013-11-07 NOTE — Telephone Encounter (Signed)
Pt is trying to find out what the status is on his rx Testosterone (ANDROGEL) 20.25 MG/1.25GM (1.62%) GEL, states insurance will only pay for 1 bottle without explanation from doctor.

## 2013-11-09 NOTE — Telephone Encounter (Signed)
I spoke with pt and this medication does require a prior authorization. Pt only picked up 1 bottle which is only a 2 week supply, he needs 2 bottles for a month supply. Can you start the prior auth? Also pt would like a call from [place] when and if we can get this resolved.

## 2013-11-11 NOTE — Telephone Encounter (Signed)
I left a voice message with below information. 

## 2013-11-11 NOTE — Telephone Encounter (Signed)
Pharmacy called stating the RX went through.  PA was approved

## 2014-05-24 ENCOUNTER — Telehealth: Payer: Self-pay | Admitting: Family Medicine

## 2014-05-24 MED ORDER — HYDROCODONE-HOMATROPINE 5-1.5 MG/5ML PO SYRP: 5.0000 mL | ORAL_SOLUTION | ORAL | Status: DC | PRN

## 2014-05-24 MED ORDER — AZITHROMYCIN 250 MG PO TABS: ORAL_TABLET | ORAL | Status: DC

## 2014-05-24 NOTE — Telephone Encounter (Signed)
He has a sinus infection.  

## 2014-06-02 ENCOUNTER — Telehealth: Payer: Self-pay | Admitting: Family Medicine

## 2014-06-02 NOTE — Telephone Encounter (Signed)
Refill request for Alprazolam 1 mg take 1 po every 4 hours as needed and send to [facility].

## 2014-06-02 NOTE — Telephone Encounter (Signed)
Call in #120 with 5 rf 

## 2014-06-05 MED ORDER — ALPRAZOLAM 1 MG PO TABS: 1.0000 mg | ORAL_TABLET | Freq: Four times a day (QID) | ORAL | Status: DC | PRN

## 2014-06-05 MED ORDER — ALPRAZOLAM 1 MG PO TABS: 1.0000 mg | ORAL_TABLET | ORAL | Status: DC | PRN

## 2014-06-05 NOTE — Telephone Encounter (Signed)
I called in script 

## 2014-06-19 ENCOUNTER — Encounter: Payer: Self-pay | Admitting: Family Medicine

## 2014-06-19 ENCOUNTER — Ambulatory Visit (INDEPENDENT_AMBULATORY_CARE_PROVIDER_SITE_OTHER): Payer: Commercial Indemnity | Admitting: Family Medicine

## 2014-06-19 VITALS — BP 162/102 | HR 70 | Temp 98.8°F | Ht 67.5 in

## 2014-06-19 DIAGNOSIS — J209 Acute bronchitis, unspecified: Secondary | ICD-10-CM | POA: Diagnosis not present

## 2014-06-19 MED ORDER — LEVOFLOXACIN 500 MG PO TABS: 500.0000 mg | ORAL_TABLET | Freq: Every day | ORAL | Status: AC

## 2014-06-19 MED ORDER — HYDROCODONE-HOMATROPINE 5-1.5 MG/5ML PO SYRP: 5.0000 mL | ORAL_SOLUTION | ORAL | Status: DC | PRN

## 2014-06-19 MED ORDER — METHYLPREDNISOLONE ACETATE 40 MG/ML IJ SUSP
120.0000 mg | Freq: Once | INTRAMUSCULAR | Status: AC
  Administered 2014-06-19: 120 mg via INTRAMUSCULAR

## 2014-06-19 MED ORDER — TESTOSTERONE 20.25 MG/1.25GM (1.62%) TD GEL: 4.0000 "application " | Freq: Every day | TRANSDERMAL | Status: DC

## 2014-06-19 NOTE — Progress Notes (Signed)
Pre visit review using our clinic review tool, if applicable. No additional management support is needed unless otherwise documented below in the visit note. 

## 2014-06-19 NOTE — Addendum Note (Signed)
Addended by: Aggie Hacker A on: 06/19/2014 12:05 PM   Modules accepted: Orders

## 2014-06-19 NOTE — Progress Notes (Signed)
   Subjective:    Patient ID: Richard Sellers, male    DOB: [birth year], 58 y.o.   MRN: [id number]  HPI Here for partially treated bronchitis. He was given a Zpack on 05-24-14 but this did not help. He still has chest tightness and is coughing up green sputum. No fever.    Review of Systems  Constitutional: Negative.   HENT: Negative.   Eyes: Negative.   Respiratory: Positive for cough, chest tightness, shortness of breath and wheezing.   Cardiovascular: Negative.        Objective:   Physical Exam  Constitutional: He appears well-developed and well-nourished.  HENT:  Right Ear: External ear normal.  Left Ear: External ear normal.  Nose: Nose normal.  Mouth/Throat: Oropharynx is clear and moist.  Eyes: Conjunctivae are normal.  Pulmonary/Chest: Effort normal. No respiratory distress. He has no rales.  Scattered rhonchi and wheezes   Lymphadenopathy:    He has no cervical adenopathy.          Assessment & Plan:  Add Mucinex

## 2014-09-11 ENCOUNTER — Ambulatory Visit (INDEPENDENT_AMBULATORY_CARE_PROVIDER_SITE_OTHER): Payer: Commercial Indemnity | Admitting: Family Medicine

## 2014-09-11 ENCOUNTER — Encounter: Payer: Self-pay | Admitting: Family Medicine

## 2014-09-11 VITALS — BP 167/106 | HR 94 | Temp 98.7°F | Ht 67.5 in

## 2014-09-11 DIAGNOSIS — J209 Acute bronchitis, unspecified: Secondary | ICD-10-CM

## 2014-09-11 MED ORDER — METHYLPREDNISOLONE ACETATE 80 MG/ML IJ SUSP
120.0000 mg | Freq: Once | INTRAMUSCULAR | Status: AC
  Administered 2014-09-11: 120 mg via INTRAMUSCULAR

## 2014-09-11 MED ORDER — FLUTICASONE PROPIONATE 50 MCG/ACT NA SUSP: NASAL | Status: DC

## 2014-09-11 MED ORDER — HYDROCODONE-HOMATROPINE 5-1.5 MG/5ML PO SYRP: 5.0000 mL | ORAL_SOLUTION | ORAL | Status: DC | PRN

## 2014-09-11 MED ORDER — LEVOFLOXACIN 500 MG PO TABS: 500.0000 mg | ORAL_TABLET | Freq: Every day | ORAL | Status: AC

## 2014-09-11 MED ORDER — SILDENAFIL CITRATE 100 MG PO TABS: 100.0000 mg | ORAL_TABLET | Freq: Every day | ORAL | Status: DC | PRN

## 2014-09-11 NOTE — Progress Notes (Signed)
   Subjective:    Patient ID: Richard Sellers, male    DOB: [birth year], 58 y.o.   MRN: [id number]  HPI Here for 5 days of PND, chest tightness and coughing up green sputum.    Review of Systems  Constitutional: Positive for fever.  HENT: Positive for postnasal drip and sinus pressure.   Eyes: Negative.   Respiratory: Positive for cough and chest tightness. Negative for shortness of breath and wheezing.        Objective:   Physical Exam  Constitutional: He appears well-developed and well-nourished.  HENT:  Right Ear: External ear normal.  Left Ear: External ear normal.  Nose: Nose normal.  Mouth/Throat: Oropharynx is clear and moist.  Eyes: Conjunctivae are normal.  Pulmonary/Chest: Effort normal. No respiratory distress. He has no wheezes. He has no rales.  Scattered rhonchi  Lymphadenopathy:    He has no cervical adenopathy.          Assessment & Plan:  Add Mucinex.

## 2014-09-11 NOTE — Progress Notes (Signed)
Pre visit review using our clinic review tool, if applicable. No additional management support is needed unless otherwise documented below in the visit note. 

## 2014-09-11 NOTE — Addendum Note (Signed)
Addended by: Aggie Hacker A on: 09/11/2014 05:00 PM   Modules accepted: Orders

## 2014-09-13 MED ORDER — SILDENAFIL CITRATE 20 MG PO TABS: 20.0000 mg | ORAL_TABLET | ORAL | Status: DC | PRN

## 2014-09-13 NOTE — Addendum Note (Signed)
Addended by: Alysia Penna A on: 09/13/2014 04:47 PM   Modules accepted: Orders, Medications

## 2014-09-20 ENCOUNTER — Telehealth: Payer: Self-pay | Admitting: Family Medicine

## 2014-09-20 NOTE — Telephone Encounter (Signed)
Can you please verify the medication name sent. Should it be sildenafil (REVATIO) 20 MG tablet or sildenafil (VIAGRA)  tablet ?? I can't submit the PA request on Revatio for ED.

## 2014-09-21 NOTE — Telephone Encounter (Signed)
The pills are 20 mg each, take 5 pills prn #50 with 11 rf

## 2014-09-21 NOTE — Telephone Encounter (Signed)
Try Sildenafil (Viagra) and it is for ED

## 2014-09-21 NOTE — Telephone Encounter (Signed)
What is the dosage and how many?

## 2014-09-28 NOTE — Telephone Encounter (Signed)
I spoke with pt and he has already pick up the script for Revatio. ( cost for 30 tablets was $ 30 )

## 2015-05-30 ENCOUNTER — Other Ambulatory Visit: Payer: Self-pay | Admitting: Family Medicine

## 2015-05-31 ENCOUNTER — Telehealth: Payer: Self-pay | Admitting: Family Medicine

## 2015-05-31 NOTE — Telephone Encounter (Signed)
Update medication list

## 2015-05-31 NOTE — Telephone Encounter (Signed)
Call in Androgel 75 gm with 5 rf, also Xanax #120 with 5 rf

## 2015-06-08 ENCOUNTER — Encounter: Payer: Self-pay | Admitting: Gastroenterology

## 2015-08-07 ENCOUNTER — Telehealth: Payer: Self-pay | Admitting: Family Medicine

## 2015-08-07 MED ORDER — AZITHROMYCIN 250 MG PO TABS: ORAL_TABLET | ORAL | Status: DC

## 2015-08-07 NOTE — Telephone Encounter (Signed)
Script was sent e-scribe 

## 2015-08-07 NOTE — Telephone Encounter (Signed)
Call in a Zpack  ?

## 2015-08-07 NOTE — Telephone Encounter (Signed)
Pt husband Richard Sellers saw dr fry yesterday and per pt was going to call zpak into [employer] [facility] for [employer]

## 2015-10-13 ENCOUNTER — Other Ambulatory Visit: Payer: Self-pay | Admitting: Family Medicine

## 2015-12-05 ENCOUNTER — Telehealth: Payer: Self-pay | Admitting: Family Medicine

## 2015-12-05 NOTE — Telephone Encounter (Signed)
I left a message for pt, we have not seen pt for this and he will need to schedule a office visit.

## 2015-12-05 NOTE — Telephone Encounter (Signed)
Pt states he and his husband have a stomach virus. Pt would like you to call him back, their work requires them to have a doctor's note  stating their doctor is aware pt is not feeling well and out of work.  Pt did not want to make an appointment.

## 2015-12-06 ENCOUNTER — Ambulatory Visit (INDEPENDENT_AMBULATORY_CARE_PROVIDER_SITE_OTHER): Payer: Managed Care, Other (non HMO) | Admitting: Family Medicine

## 2015-12-06 ENCOUNTER — Encounter: Payer: Self-pay | Admitting: Family Medicine

## 2015-12-06 VITALS — BP 146/92 | HR 69 | Temp 98.4°F

## 2015-12-06 DIAGNOSIS — B349 Viral infection, unspecified: Secondary | ICD-10-CM | POA: Diagnosis not present

## 2015-12-06 MED ORDER — ONDANSETRON HCL 8 MG PO TABS: 8.0000 mg | ORAL_TABLET | Freq: Three times a day (TID) | ORAL | Status: DC | PRN

## 2015-12-06 NOTE — Progress Notes (Signed)
   Subjective:    Patient ID: Richard Sellers, male    DOB: 03-Dec-1955, 60 y.o.   MRN: [id number]  HPI Here for 3 days of nausea, vomiting, and diarrhea. No fever. The worst of it was the past 2 days, he feels better today. The last vomiting was last night. He is drinking fluids again and has eaten a small amount of rice.    Review of Systems  Constitutional: Negative.   HENT: Negative.   Eyes: Negative.   Respiratory: Negative.   Gastrointestinal: Positive for nausea, vomiting and diarrhea. Negative for abdominal pain, constipation and abdominal distention.       Objective:   Physical Exam  Constitutional:  Appears somewhat weak   HENT:  Right Ear: External ear normal.  Left Ear: External ear normal.  Nose: Nose normal.  Mouth/Throat: Oropharynx is clear and moist.  Eyes: Conjunctivae are normal.  Neck: Neck supple. No thyromegaly present.  Pulmonary/Chest: Effort normal and breath sounds normal.  Abdominal: Soft. Bowel sounds are normal. He exhibits no distension and no mass. There is no tenderness. There is no rebound and no guarding.  Lymphadenopathy:    He has no cervical adenopathy.          Assessment & Plan:  Viral illness. Written out of work from [phone] through today. Use Zofran for nausea.

## 2015-12-06 NOTE — Progress Notes (Signed)
Pre visit review using our clinic review tool, if applicable. No additional management support is needed unless otherwise documented below in the visit note. Pt declined to weigh 

## 2016-03-31 ENCOUNTER — Encounter: Payer: Self-pay | Admitting: Family Medicine

## 2016-03-31 ENCOUNTER — Ambulatory Visit (INDEPENDENT_AMBULATORY_CARE_PROVIDER_SITE_OTHER): Payer: Managed Care, Other (non HMO) | Admitting: Family Medicine

## 2016-03-31 VITALS — BP 144/104 | HR 83 | Temp 99.0°F

## 2016-03-31 DIAGNOSIS — R062 Wheezing: Secondary | ICD-10-CM

## 2016-03-31 MED ORDER — DOXYCYCLINE HYCLATE 100 MG PO CAPS: 100.0000 mg | ORAL_CAPSULE | Freq: Two times a day (BID) | ORAL | Status: DC

## 2016-03-31 MED ORDER — METHYLPREDNISOLONE ACETATE 80 MG/ML IJ SUSP
80.0000 mg | Freq: Once | INTRAMUSCULAR | Status: AC
  Administered 2016-03-31: 80 mg via INTRAMUSCULAR

## 2016-03-31 MED ORDER — HYDROCODONE-HOMATROPINE 5-1.5 MG/5ML PO SYRP: 5.0000 mL | ORAL_SOLUTION | Freq: Four times a day (QID) | ORAL | Status: AC | PRN

## 2016-03-31 NOTE — Progress Notes (Signed)
   Subjective:    Patient ID: Richard Sellers, male    DOB: Dec 06, 1955, 60 y.o.   MRN: [id number]  HPI Patient seen with three-day history of cough. He is concerned because he had difficulty shaking bronchial things in the past. Cough is mostly nonproductive. He feels he has some wheezing intermittently at night. No dyspnea. No fevers or chills. Quit smoking several years ago. Minimal nasal congestion.  Past Medical History  Diagnosis Date  . Depression   . Allergy   . Anxiety   . Hypertension   . ED (erectile dysfunction)   . Warts, genital    Past Surgical History  Procedure Laterality Date  . Appendectomy    . Tonsillectomy    . Cystectomy    . Knee arthroscopy      right knee    reports that he has quit smoking. He has never used smokeless tobacco. He reports that he drinks about 4.2 oz of alcohol per week. He reports that he does not use illicit drugs. family history is not on file. No Known Allergies    Review of Systems  Constitutional: Negative for fever and chills.  HENT: Positive for congestion.   Respiratory: Positive for cough and wheezing. Negative for shortness of breath.   Cardiovascular: Negative for chest pain.       Objective:   Physical Exam  Constitutional: He appears well-developed and well-nourished.  HENT:  Right Ear: External ear normal.  Left Ear: External ear normal.  Mouth/Throat: Oropharynx is clear and moist.  Neck: Neck supple.  Cardiovascular: Normal rate and regular rhythm.   Pulmonary/Chest: Effort normal. He has no rales.  Minimal expiratory wheezes. No retractions. Normal respiratory rate. No rales.  Musculoskeletal: He exhibits no edema.  Lymphadenopathy:    He has no cervical adenopathy.          Assessment & Plan:  Cough with mild reactive airway component. Patient requesting Depo-Medrol injection. He states this has helped in the past. Very minimal wheezing on exam today. Depo-Medrol 80 mg IM given. Limited Hycodan  cough syrup 1 teaspoon daily at bedtime for severe cough. Would not start antibiotics at this point but if he has any new fever or progressive cough consider doxycycline. Written prescription given.  Eulas Post MD [facility]

## 2016-03-31 NOTE — Progress Notes (Signed)
Pre visit review using our clinic review tool, if applicable. No additional management support is needed unless otherwise documented below in the visit note. 

## 2016-03-31 NOTE — Patient Instructions (Signed)

## 2016-05-02 ENCOUNTER — Other Ambulatory Visit: Payer: Self-pay | Admitting: *Deleted

## 2016-05-02 NOTE — Telephone Encounter (Signed)
Please fill these. 

## 2016-05-05 MED ORDER — TESTOSTERONE 20.25 MG/ACT (1.62%) TD GEL: TRANSDERMAL | 5 refills | Status: DC

## 2016-05-05 MED ORDER — ALPRAZOLAM 1 MG PO TABS: 1.0000 mg | ORAL_TABLET | ORAL | 5 refills | Status: DC | PRN

## 2016-07-01 ENCOUNTER — Other Ambulatory Visit: Payer: Self-pay | Admitting: Family Medicine

## 2016-07-01 NOTE — Telephone Encounter (Signed)
Refill request for Flonase and send to [facility].

## 2016-07-03 MED ORDER — FLUTICASONE PROPIONATE 50 MCG/ACT NA SUSP: NASAL | 6 refills | Status: DC

## 2016-07-03 NOTE — Telephone Encounter (Signed)
I sent script e-scribe to below pharmacy.  

## 2016-11-12 ENCOUNTER — Other Ambulatory Visit: Payer: Self-pay | Admitting: Family Medicine

## 2016-11-14 NOTE — Telephone Encounter (Signed)
Please refill for 6 months 

## 2016-11-18 ENCOUNTER — Telehealth: Payer: Self-pay | Admitting: Family Medicine

## 2016-11-18 ENCOUNTER — Other Ambulatory Visit: Payer: Managed Care, Other (non HMO)

## 2016-11-18 DIAGNOSIS — N529 Male erectile dysfunction, unspecified: Secondary | ICD-10-CM

## 2016-11-18 DIAGNOSIS — Z0000 Encounter for general adult medical examination without abnormal findings: Secondary | ICD-10-CM

## 2016-11-18 NOTE — Telephone Encounter (Signed)
Pt is on lab schedule for in the morning, he will be fasting, can he get his physical labs drawn also?

## 2016-11-18 NOTE — Telephone Encounter (Signed)
Patient isn't happy with the Androgel and is wanting to see if he could start getting the testosterone injection instead.

## 2016-11-18 NOTE — Telephone Encounter (Signed)
The labs are ordered

## 2016-11-19 ENCOUNTER — Other Ambulatory Visit (INDEPENDENT_AMBULATORY_CARE_PROVIDER_SITE_OTHER): Payer: Managed Care, Other (non HMO)

## 2016-11-19 DIAGNOSIS — N529 Male erectile dysfunction, unspecified: Secondary | ICD-10-CM

## 2016-11-19 DIAGNOSIS — Z0000 Encounter for general adult medical examination without abnormal findings: Secondary | ICD-10-CM

## 2016-11-19 LAB — HEPATIC FUNCTION PANEL
ALT: 36 U/L (ref 0–53)
AST: 26 U/L (ref 0–37)
Albumin: 4.2 g/dL (ref 3.5–5.2)
Alkaline Phosphatase: 76 U/L (ref 39–117)
Bilirubin, Direct: 0.1 mg/dL (ref 0.0–0.3)
Total Bilirubin: 0.6 mg/dL (ref 0.2–1.2)
Total Protein: 7.2 g/dL (ref 6.0–8.3)

## 2016-11-19 LAB — POC URINALSYSI DIPSTICK (AUTOMATED)
Bilirubin, UA: NEGATIVE
Glucose, UA: NEGATIVE
Ketones, UA: NEGATIVE
Leukocytes, UA: NEGATIVE
Nitrite, UA: NEGATIVE
Protein, UA: NEGATIVE
Spec Grav, UA: 1.025
Urobilinogen, UA: 0.2
pH, UA: 5

## 2016-11-19 LAB — CBC WITH DIFFERENTIAL/PLATELET
Basophils Absolute: 0.1 10*3/uL (ref 0.0–0.1)
Basophils Relative: 1 % (ref 0.0–3.0)
Eosinophils Absolute: 0.2 10*3/uL (ref 0.0–0.7)
Eosinophils Relative: 3.4 % (ref 0.0–5.0)
HCT: 43.4 % (ref 39.0–52.0)
Hemoglobin: 14.7 g/dL (ref 13.0–17.0)
Lymphocytes Relative: 31.7 % (ref 12.0–46.0)
Lymphs Abs: 1.8 10*3/uL (ref 0.7–4.0)
MCHC: 33.8 g/dL (ref 30.0–36.0)
MCV: 93.1 fl (ref 78.0–100.0)
Monocytes Absolute: 0.4 10*3/uL (ref 0.1–1.0)
Monocytes Relative: 7.8 % (ref 3.0–12.0)
Neutro Abs: 3.2 10*3/uL (ref 1.4–7.7)
Neutrophils Relative %: 56.1 % (ref 43.0–77.0)
Platelets: 292 10*3/uL (ref 150.0–400.0)
RBC: 4.66 Mil/uL (ref 4.22–5.81)
RDW: 13.4 % (ref 11.5–15.5)
WBC: 5.7 10*3/uL (ref 4.0–10.5)

## 2016-11-19 LAB — BASIC METABOLIC PANEL
BUN: 15 mg/dL (ref 6–23)
CO2: 28 mEq/L (ref 19–32)
Calcium: 9.6 mg/dL (ref 8.4–10.5)
Chloride: 105 mEq/L (ref 96–112)
Creatinine, Ser: 1.01 mg/dL (ref 0.40–1.50)
GFR: 80.03 mL/min (ref >60.00)
Glucose, Bld: 133 mg/dL — ABNORMAL HIGH (ref 70–99)
Potassium: 4.7 mEq/L (ref 3.5–5.1)
Sodium: 140 mEq/L (ref 135–145)

## 2016-11-19 LAB — PSA: PSA: 0.89 ng/mL (ref 0.10–4.00)

## 2016-11-19 LAB — LIPID PANEL
Cholesterol: 243 mg/dL — ABNORMAL HIGH (ref 0–200)
HDL: 45.7 mg/dL (ref >39.00)
LDL Cholesterol: 168 mg/dL — ABNORMAL HIGH (ref 0–99)
NonHDL: 197.74
Total CHOL/HDL Ratio: 5
Triglycerides: 151 mg/dL — ABNORMAL HIGH (ref 0.0–149.0)
VLDL: 30.2 mg/dL (ref 0.0–40.0)

## 2016-11-19 LAB — TESTOSTERONE: Testosterone: 261.24 ng/dL — ABNORMAL LOW (ref 300.00–890.00)

## 2016-11-19 LAB — TSH: TSH: 4.79 u[IU]/mL — ABNORMAL HIGH (ref 0.35–4.50)

## 2016-11-19 NOTE — Telephone Encounter (Signed)
I spoke with pt  

## 2016-11-25 ENCOUNTER — Ambulatory Visit (INDEPENDENT_AMBULATORY_CARE_PROVIDER_SITE_OTHER): Payer: Managed Care, Other (non HMO) | Admitting: Family Medicine

## 2016-11-25 ENCOUNTER — Encounter: Payer: Self-pay | Admitting: Family Medicine

## 2016-11-25 VITALS — HR 83 | Temp 98.5°F | Ht 67.0 in | Wt 267.0 lb

## 2016-11-25 DIAGNOSIS — E039 Hypothyroidism, unspecified: Secondary | ICD-10-CM

## 2016-11-25 DIAGNOSIS — Z0000 Encounter for general adult medical examination without abnormal findings: Secondary | ICD-10-CM

## 2016-11-25 MED ORDER — "SYRINGE/NEEDLE (DISP) 20G X 1-1/2"" 3 ML MISC": 1.0000 "application " | 2 refills | Status: DC

## 2016-11-25 MED ORDER — TESTOSTERONE CYPIONATE 200 MG/ML IM SOLN
200.0000 mg | INTRAMUSCULAR | 5 refills | Status: DC
Start: 2016-11-25 — End: 2017-03-26

## 2016-11-25 MED ORDER — LEVOTHYROXINE SODIUM 75 MCG PO TABS: 75.0000 ug | ORAL_TABLET | Freq: Every day | ORAL | 3 refills | Status: DC

## 2016-11-25 MED ORDER — LISINOPRIL-HYDROCHLOROTHIAZIDE 20-25 MG PO TABS: 1.0000 | ORAL_TABLET | Freq: Every day | ORAL | 3 refills | Status: DC

## 2016-11-25 MED ORDER — ALPRAZOLAM 1 MG PO TABS: 1.0000 mg | ORAL_TABLET | Freq: Four times a day (QID) | ORAL | 5 refills | Status: DC | PRN

## 2016-11-25 NOTE — Progress Notes (Signed)
   Subjective:    Patient ID: Richard Sellers, male    DOB: 1956/03/31, 61 y.o.   MRN: [id number]  HPI 61 yr old male for a well exam. He feels well except for generalized fatigue. He knows he is out of shape and he has gained a fair amount of weight in the past year. We discussed his umbilical hernia and he says it never bothers him. He has been using Androgel for testosterone replacement, but he wants to switch to injections.    Review of Systems  Constitutional: Positive for fatigue and unexpected weight change. Negative for activity change, appetite change, chills and fever.  HENT: Negative.   Eyes: Negative.   Respiratory: Negative.   Cardiovascular: Negative.   Gastrointestinal: Negative.   Genitourinary: Negative.   Musculoskeletal: Negative.   Skin: Negative.   Neurological: Negative.   Psychiatric/Behavioral: Negative.        Objective:   Physical Exam  Constitutional: He is oriented to person, place, and time. He appears well-developed and well-nourished. No distress.  HENT:  Head: Normocephalic and atraumatic.  Right Ear: External ear normal.  Left Ear: External ear normal.  Nose: Nose normal.  Mouth/Throat: Oropharynx is clear and moist. No oropharyngeal exudate.  Eyes: Conjunctivae and EOM are normal. Pupils are equal, round, and reactive to light. Right eye exhibits no discharge. Left eye exhibits no discharge. No scleral icterus.  Neck: Neck supple. No JVD present. No tracheal deviation present. No thyromegaly present.  Cardiovascular: Normal rate, regular rhythm, normal heart sounds and intact distal pulses.  Exam reveals no gallop and no friction rub.   No murmur heard. Pulmonary/Chest: Effort normal and breath sounds normal. No respiratory distress. He has no wheezes. He has no rales. He exhibits no tenderness.  Abdominal: Soft. Bowel sounds are normal. He exhibits no distension. There is no tenderness. There is no rebound and no guarding.  There is a small  non-tender reducible umbilical hernia  Genitourinary: Rectum normal, prostate normal and penis normal. Rectal exam shows guaiac negative stool. No penile tenderness.  Musculoskeletal: Normal range of motion. He exhibits no edema or tenderness.  Lymphadenopathy:    He has no cervical adenopathy.  Neurological: He is alert and oriented to person, place, and time. He has normal reflexes. No cranial nerve deficit. He exhibits normal muscle tone. Coordination normal.  Skin: Skin is warm and dry. No rash noted. He is not diaphoretic. No erythema. No pallor.  Psychiatric: He has a normal mood and affect. His behavior is normal. Judgment and thought content normal.          Assessment & Plan:  Well exam. We discussed diet and exercise. I think we can observe the hernia for now, especially if he can lose some weight. Stop Androgel and switch to injections of testosterone cypionate every 14 days. His BP is back up so we will start him on Lisinopril HCT. Correct the hypothyroidism with Synthroid. Recheck BP in on month and TSH in 90 days.  Alysia Penna, MD

## 2016-11-25 NOTE — Patient Instructions (Signed)
WE NOW OFFER   [facility] FAST TRACK!!!  SAME DAY Appointments for ACUTE CARE  Such as: Sprains, Injuries, cuts, abrasions, rashes, muscle pain, joint pain, back pain Colds, flu, sore throats, headache, allergies, cough, fever  Ear pain, sinus and eye infections Abdominal pain, nausea, vomiting, diarrhea, upset stomach Animal/insect bites  3 Easy Ways to Schedule: Walk-In Scheduling Call in scheduling Mychart Sign-up: [email or web address]         

## 2016-11-25 NOTE — Progress Notes (Signed)
Pre visit review using our clinic review tool, if applicable. No additional management support is needed unless otherwise documented below in the visit note. 

## 2016-11-26 ENCOUNTER — Encounter: Payer: Self-pay | Admitting: Family Medicine

## 2016-11-26 DIAGNOSIS — E039 Hypothyroidism, unspecified: Secondary | ICD-10-CM | POA: Insufficient documentation

## 2017-01-16 ENCOUNTER — Telehealth: Payer: Self-pay | Admitting: Family Medicine

## 2017-01-16 MED ORDER — AMLODIPINE BESYLATE 5 MG PO TABS: 5.0000 mg | ORAL_TABLET | Freq: Every day | ORAL | 2 refills | Status: DC

## 2017-01-16 NOTE — Telephone Encounter (Signed)
Pt states one day after taking new lisinopril-hydrochlorothiazide (PRINZIDE,ZESTORETIC) 20-25 MG tablet Pt has a back ache.  So pt instructed to break in half.  Now pt states in the evening , pt is having neck strain, like someone pressing on the nerve in his neck. Pt has to lay down due to neck feeling heavy. Pt convinced this is from this med. Good new is his bp has come down.  Please advise. thanks

## 2017-01-16 NOTE — Telephone Encounter (Signed)
Tell him to stop the Lisinopril HCT and try Amlodipine 5 mg daily. Call in #30 with 2 rf

## 2017-01-16 NOTE — Telephone Encounter (Signed)
I spoke with pt, removed Lisinopril from current medication list and sent new script e-scribe to [facility].

## 2017-01-20 ENCOUNTER — Telehealth: Payer: Self-pay | Admitting: Family Medicine

## 2017-01-20 NOTE — Telephone Encounter (Signed)
I spoke with pt and went over below advice, pt agreed.  

## 2017-01-20 NOTE — Telephone Encounter (Signed)
Tell him to take two Amlodipine tablets (a total of 10 mg ) daily for a week and then report back to [place]

## 2017-01-20 NOTE — Telephone Encounter (Signed)
Pt state that the Bp medication he is on is not working and his Bp reading from today is 174/111 and it is steadily creeping back up and would like to have a call back concerning this today.

## 2017-01-27 ENCOUNTER — Telehealth: Payer: Self-pay | Admitting: Family Medicine

## 2017-01-27 MED ORDER — AMLODIPINE BESYLATE 5 MG PO TABS
5.0000 mg | ORAL_TABLET | Freq: Two times a day (BID) | ORAL | 3 refills | Status: DC
Start: 2017-01-27 — End: 2017-02-17

## 2017-01-27 NOTE — Telephone Encounter (Signed)
I sent script e-scribe to [facility].

## 2017-01-27 NOTE — Telephone Encounter (Signed)
° ° °  Pt said the below med was increased to 2 pills a day and now he is running out and is asking for a new rx for 2 pills a day    amLODipine (NORVASC) 5 MG tablet

## 2017-02-17 ENCOUNTER — Encounter: Payer: Self-pay | Admitting: Family Medicine

## 2017-02-17 ENCOUNTER — Ambulatory Visit (INDEPENDENT_AMBULATORY_CARE_PROVIDER_SITE_OTHER): Payer: Managed Care, Other (non HMO) | Admitting: Family Medicine

## 2017-02-17 VITALS — BP 162/108 | HR 87 | Temp 98.3°F

## 2017-02-17 DIAGNOSIS — E039 Hypothyroidism, unspecified: Secondary | ICD-10-CM

## 2017-02-17 DIAGNOSIS — I10 Essential (primary) hypertension: Secondary | ICD-10-CM | POA: Diagnosis not present

## 2017-02-17 DIAGNOSIS — N529 Male erectile dysfunction, unspecified: Secondary | ICD-10-CM | POA: Diagnosis not present

## 2017-02-17 DIAGNOSIS — R600 Localized edema: Secondary | ICD-10-CM | POA: Diagnosis not present

## 2017-02-17 LAB — TSH: TSH: 0.85 u[IU]/mL (ref 0.35–4.50)

## 2017-02-17 LAB — TESTOSTERONE: Testosterone: 337.33 ng/dL (ref 300.00–890.00)

## 2017-02-17 MED ORDER — METOPROLOL SUCCINATE ER 50 MG PO TB24: 50.0000 mg | ORAL_TABLET | Freq: Every day | ORAL | 3 refills | Status: DC

## 2017-02-17 MED ORDER — HYDROCHLOROTHIAZIDE 25 MG PO TABS: 25.0000 mg | ORAL_TABLET | Freq: Every day | ORAL | 3 refills | Status: DC

## 2017-02-17 NOTE — Progress Notes (Signed)
   Subjective:    Patient ID: Richard Sellers, male    DOB: 11-06-1955, 61 y.o.   MRN: [id number]  HPI Here with concerns about his BP and with leg swelling. Over the past few weeks we have been adjusting his medications for high BP readings, and 3 weeks ago we increased the Amlodipine to a total of 10 mg daily. During the past 2 weeks he has developed swelling in the lower legs and ankles, and he has never had this before. No SOB. At his well exam 3 months ago he had normal renal function.  Also we started him on testosterone shots and thyroid medication 3 months ago and we need to follow up. He feels a little more energy now and his erections have also improved.    Review of Systems  Constitutional: Negative.   Respiratory: Negative.   Cardiovascular: Positive for leg swelling. Negative for chest pain and palpitations.  Endocrine: Negative.   Neurological: Negative.        Objective:   Physical Exam  Constitutional: He appears well-developed and well-nourished.  Neck: No thyromegaly present.  Cardiovascular: Normal rate, regular rhythm, normal heart sounds and intact distal pulses.   Pulmonary/Chest: Effort normal and breath sounds normal. No respiratory distress. He has no wheezes. He has no rales.  Musculoskeletal:  2+ edema in both feet and lower legs   Lymphadenopathy:    He has no cervical adenopathy.          Assessment & Plan:  The swelling in his legs is most likely a side effect of the Amlodipine so we will stop this completely. In its place we will start Metoprolol succinate 50 mg daily and HCTZ 25 mg daily. Recheck in 3 weeks. We will also recheck a TSH and a testosterone level today.  Alysia Penna, MD

## 2017-02-17 NOTE — Patient Instructions (Signed)
WE NOW OFFER   [facility] FAST TRACK!!!  SAME DAY Appointments for ACUTE CARE  Such as: Sprains, Injuries, cuts, abrasions, rashes, muscle pain, joint pain, back pain Colds, flu, sore throats, headache, allergies, cough, fever  Ear pain, sinus and eye infections Abdominal pain, nausea, vomiting, diarrhea, upset stomach Animal/insect bites  3 Easy Ways to Schedule: Walk-In Scheduling Call in scheduling Mychart Sign-up: [email or web address]         

## 2017-02-18 ENCOUNTER — Other Ambulatory Visit: Payer: Self-pay | Admitting: Family Medicine

## 2017-02-18 NOTE — Telephone Encounter (Signed)
I went over recent lab results

## 2017-02-24 ENCOUNTER — Telehealth: Payer: Self-pay | Admitting: Family Medicine

## 2017-02-24 NOTE — Telephone Encounter (Signed)
It is too soon for the medication to be full effective. Stay on the current dose and by 2 more weeks we will have a better idea

## 2017-02-24 NOTE — Telephone Encounter (Signed)
I spoke with pt and went over below information. 

## 2017-02-24 NOTE — Telephone Encounter (Signed)
I spoke with pt, readings now are in the 140's-130's over 99-111. Please advise?

## 2017-02-24 NOTE — Telephone Encounter (Signed)
Pt would like Dr to know that his BP is starting to rise again, even after starting the  metoprolol succinate (TOPROL-XL) 50 MG 24 hr tablet  Would like to know how to proceed from here.

## 2017-03-06 ENCOUNTER — Ambulatory Visit (INDEPENDENT_AMBULATORY_CARE_PROVIDER_SITE_OTHER): Payer: Managed Care, Other (non HMO) | Admitting: Family Medicine

## 2017-03-06 ENCOUNTER — Encounter: Payer: Self-pay | Admitting: Family Medicine

## 2017-03-06 VITALS — BP 156/114 | HR 79 | Temp 98.4°F | Ht 67.0 in | Wt 253.0 lb

## 2017-03-06 DIAGNOSIS — F418 Other specified anxiety disorders: Secondary | ICD-10-CM | POA: Diagnosis not present

## 2017-03-06 MED ORDER — BUPROPION HCL ER (XL) 150 MG PO TB24: 150.0000 mg | ORAL_TABLET | Freq: Every day | ORAL | 2 refills | Status: DC

## 2017-03-06 MED ORDER — TEMAZEPAM 30 MG PO CAPS: 30.0000 mg | ORAL_CAPSULE | Freq: Every evening | ORAL | 2 refills | Status: DC | PRN

## 2017-03-06 NOTE — Progress Notes (Signed)
   Subjective:    Patient ID: Richard Sellers, male    DOB: 11/05/1955, 61 y.o.   MRN: [id number]  HPI Here asking for help with anxiety and depression. His husband broke up with him last weekend and moved in with another man. Of course Richard Sellers has been very upset about this, tearful, he cannot eat or sleep. He has been taking Xanax 3 or 4 times a day but this only helps for a brief period.    Review of Systems  Constitutional: Negative.   Respiratory: Negative.   Cardiovascular: Negative.   Neurological: Negative.   Psychiatric/Behavioral: Positive for decreased concentration, dysphoric mood and sleep disturbance. Negative for agitation, behavioral problems, confusion and hallucinations. The patient is nervous/anxious.        Objective:   Physical Exam  Constitutional: He is oriented to person, place, and time. He appears well-developed and well-nourished.  Cardiovascular: Normal rate, regular rhythm, normal heart sounds and intact distal pulses.   Pulmonary/Chest: Effort normal and breath sounds normal.  Neurological: He is alert and oriented to person, place, and time.  Psychiatric: His behavior is normal. Thought content normal.  Anxious, tearful          Assessment & Plan:  Depression and anxiety over a recent breakup. He will try Wellbutrin XL 150 mg each morning and try Temazepam for sleep. Use Xanax as needed. Recheck with [place] in 3 weeks. We spent 40 minutes discussing his situation and his emotional reactions.  Alysia Penna, MD

## 2017-03-06 NOTE — Patient Instructions (Signed)
WE NOW OFFER   [facility] FAST TRACK!!!  SAME DAY Appointments for ACUTE CARE  Such as: Sprains, Injuries, cuts, abrasions, rashes, muscle pain, joint pain, back pain Colds, flu, sore throats, headache, allergies, cough, fever  Ear pain, sinus and eye infections Abdominal pain, nausea, vomiting, diarrhea, upset stomach Animal/insect bites  3 Easy Ways to Schedule: Walk-In Scheduling Call in scheduling Mychart Sign-up: [email or web address]         

## 2017-03-26 ENCOUNTER — Ambulatory Visit (INDEPENDENT_AMBULATORY_CARE_PROVIDER_SITE_OTHER): Payer: Managed Care, Other (non HMO) | Admitting: Family Medicine

## 2017-03-26 ENCOUNTER — Encounter: Payer: Self-pay | Admitting: Family Medicine

## 2017-03-26 VITALS — BP 130/98 | HR 71 | Temp 98.6°F | Ht 67.0 in | Wt 229.0 lb

## 2017-03-26 DIAGNOSIS — F411 Generalized anxiety disorder: Secondary | ICD-10-CM | POA: Diagnosis not present

## 2017-03-26 DIAGNOSIS — F321 Major depressive disorder, single episode, moderate: Secondary | ICD-10-CM | POA: Diagnosis not present

## 2017-03-26 DIAGNOSIS — I10 Essential (primary) hypertension: Secondary | ICD-10-CM

## 2017-03-26 MED ORDER — TESTOSTERONE CYPIONATE 200 MG/ML IM SOLN: 200.0000 mg | INTRAMUSCULAR | 5 refills | Status: DC

## 2017-03-26 MED ORDER — BUPROPION HCL ER (XL) 300 MG PO TB24: 300.0000 mg | ORAL_TABLET | Freq: Every day | ORAL | 5 refills | Status: DC

## 2017-03-26 MED ORDER — DICLOFENAC SODIUM 50 MG PO TBEC: 50.0000 mg | DELAYED_RELEASE_TABLET | Freq: Three times a day (TID) | ORAL | 5 refills | Status: DC | PRN

## 2017-03-26 NOTE — Progress Notes (Signed)
   Subjective:    Patient ID: Richard Sellers, male    DOB: [birth year], 61 y.o.   MRN: [id number]  HPI Here to follow up on depression, anxiety, and insomnia. We saw him 3 weeks ago shortly after his husband moved out. He has been taking Wellbutrin XL 150 mg daily along with Xanax bid and Temazepam. He feels better today although he is still upset. His depression has lifted a bit and he is sleeping much better. He has lost 24 lbs since that visit.    Review of Systems  Constitutional: Negative.   Respiratory: Negative.   Cardiovascular: Negative.   Neurological: Negative.   Psychiatric/Behavioral: Positive for decreased concentration and dysphoric mood. Negative for confusion. The patient is nervous/anxious.        Objective:   Physical Exam  Constitutional: He is oriented to person, place, and time. He appears well-developed and well-nourished.  Cardiovascular: Normal rate, regular rhythm, normal heart sounds and intact distal pulses.   Pulmonary/Chest: Effort normal and breath sounds normal.  Neurological: He is alert and oriented to person, place, and time.  Psychiatric: He has a normal mood and affect. His behavior is normal. Thought content normal.          Assessment & Plan:  His insomnia has improved quite a bit so he will stay on Temazepam. We will increase the Wellbutrin XL to 300 mg daily. Recheck in 2 weeks.  Alysia Penna, MD

## 2017-03-26 NOTE — Patient Instructions (Signed)
WE NOW OFFER   [facility] FAST TRACK!!!  SAME DAY Appointments for ACUTE CARE  Such as: Sprains, Injuries, cuts, abrasions, rashes, muscle pain, joint pain, back pain Colds, flu, sore throats, headache, allergies, cough, fever  Ear pain, sinus and eye infections Abdominal pain, nausea, vomiting, diarrhea, upset stomach Animal/insect bites  3 Easy Ways to Schedule: Walk-In Scheduling Call in scheduling Mychart Sign-up: [email or web address]         

## 2017-04-09 ENCOUNTER — Encounter: Payer: Self-pay | Admitting: Family Medicine

## 2017-04-09 ENCOUNTER — Ambulatory Visit (INDEPENDENT_AMBULATORY_CARE_PROVIDER_SITE_OTHER): Payer: Managed Care, Other (non HMO) | Admitting: Family Medicine

## 2017-04-09 VITALS — BP 122/84 | HR 73 | Temp 98.3°F | Ht 67.0 in | Wt 217.0 lb

## 2017-04-09 DIAGNOSIS — F321 Major depressive disorder, single episode, moderate: Secondary | ICD-10-CM

## 2017-04-09 DIAGNOSIS — F411 Generalized anxiety disorder: Secondary | ICD-10-CM

## 2017-04-09 NOTE — Progress Notes (Signed)
   Subjective:    Patient ID: [facility]ellers, male    DOB: Aug 25, 1956, 61 y.o.   MRN: [id number]  HPI Here to follow up on depression and anxiety. He has been taking Wellbutrin and prn Xanax, and he does feel better that he did 2 weeks ago. He stil has rough days and nights, but he has begun seeing a therapist on a weekly basis and he finds this very helpful.    Review of Systems  Constitutional: Negative.   Respiratory: Negative.   Cardiovascular: Negative.   Neurological: Negative.   Psychiatric/Behavioral: Positive for dysphoric mood. The patient is nervous/anxious.        Objective:   Physical Exam  Constitutional: He is oriented to person, place, and time. He appears well-developed and well-nourished.  Cardiovascular: Normal rate, regular rhythm, normal heart sounds and intact distal pulses.   Pulmonary/Chest: Effort normal and breath sounds normal.  Neurological: He is alert and oriented to person, place, and time.  Psychiatric: He has a normal mood and affect. His behavior is normal. Thought content normal.          Assessment & Plan:  Depression and anxiety, improved. We will stay with the current regimen and he will follow up prn.  Alysia Penna, MD

## 2017-04-09 NOTE — Patient Instructions (Signed)
WE NOW OFFER   [facility] FAST TRACK!!!  SAME DAY Appointments for ACUTE CARE  Such as: Sprains, Injuries, cuts, abrasions, rashes, muscle pain, joint pain, back pain Colds, flu, sore throats, headache, allergies, cough, fever  Ear pain, sinus and eye infections Abdominal pain, nausea, vomiting, diarrhea, upset stomach Animal/insect bites  3 Easy Ways to Schedule: Walk-In Scheduling Call in scheduling Mychart Sign-up: [email or web address]         

## 2017-05-24 ENCOUNTER — Encounter: Payer: Self-pay | Admitting: Family Medicine

## 2017-05-26 NOTE — Telephone Encounter (Signed)
I spoke with pt and went over below information. 

## 2017-05-26 NOTE — Telephone Encounter (Signed)
Tell him to take 1/2 tablet of Metoprolol (or 25 mg) daily and watch the BP closely

## 2017-05-31 ENCOUNTER — Other Ambulatory Visit: Payer: Self-pay | Admitting: Family Medicine

## 2017-06-01 ENCOUNTER — Other Ambulatory Visit: Payer: Self-pay | Admitting: Family Medicine

## 2017-06-02 NOTE — Telephone Encounter (Signed)
Call in Alprazolam #120 with 5 rf, also Temazepam #30 with 5 rf

## 2017-06-11 ENCOUNTER — Encounter: Payer: Self-pay | Admitting: Family Medicine

## 2017-06-13 ENCOUNTER — Other Ambulatory Visit: Payer: Self-pay | Admitting: Family Medicine

## 2017-06-16 ENCOUNTER — Other Ambulatory Visit: Payer: Self-pay | Admitting: Family Medicine

## 2017-07-06 ENCOUNTER — Ambulatory Visit (INDEPENDENT_AMBULATORY_CARE_PROVIDER_SITE_OTHER): Payer: Managed Care, Other (non HMO) | Admitting: Family Medicine

## 2017-07-06 ENCOUNTER — Encounter: Payer: Self-pay | Admitting: Family Medicine

## 2017-07-06 VITALS — BP 118/78 | Temp 98.4°F

## 2017-07-06 DIAGNOSIS — K429 Umbilical hernia without obstruction or gangrene: Secondary | ICD-10-CM | POA: Diagnosis not present

## 2017-07-06 DIAGNOSIS — I10 Essential (primary) hypertension: Secondary | ICD-10-CM

## 2017-07-06 NOTE — Patient Instructions (Signed)
WE NOW OFFER   [facility] FAST TRACK!!!  SAME DAY Appointments for ACUTE CARE  Such as: Sprains, Injuries, cuts, abrasions, rashes, muscle pain, joint pain, back pain Colds, flu, sore throats, headache, allergies, cough, fever  Ear pain, sinus and eye infections Abdominal pain, nausea, vomiting, diarrhea, upset stomach Animal/insect bites  3 Easy Ways to Schedule: Walk-In Scheduling Call in scheduling Mychart Sign-up: [email or web address]         

## 2017-07-06 NOTE — Progress Notes (Signed)
   Subjective:    Patient ID: [facility]ellers, male    DOB: 09/16/1956, 61 y.o.   MRN: [id number]  HPI Here to check on his umbilical hernia and also on HTN. He has been working out and watching a strict diet and he has lost a fair amount of weight in the past 2 months. His BP has been coming down and in fact he has stopped taking any HCTZ or metoprolol. Otherwise his umbilical hernia has been growing larger and has become more painful over the past 4 weeks. He had had to leave work early twice due to severe abdominal pains. BMs are normal. No fever or nausea.    Review of Systems  Constitutional: Negative.   Respiratory: Negative.   Cardiovascular: Negative.   Gastrointestinal: Positive for abdominal pain. Negative for abdominal distention, anal bleeding, blood in stool, constipation, diarrhea, nausea, rectal pain and vomiting.       Objective:   Physical Exam  Constitutional: He appears well-developed and well-nourished. No distress.  Neck: No thyromegaly present.  Cardiovascular: Normal rate, regular rhythm, normal heart sounds and intact distal pulses.   Pulmonary/Chest: Effort normal and breath sounds normal. No respiratory distress. He has no wheezes. He has no rales.  Abdominal: Soft. Bowel sounds are normal. He exhibits no distension. There is no rebound and no guarding.  Moderate sized umbilical hernia that is tender but easily reducible   Lymphadenopathy:    He has no cervical adenopathy.          Assessment & Plan:  His HTN is now controlled off all medications, due to his wight loss. We will monitor this closely. We will refer him to Surgery to evaluate the hernia and I suspect this will require surgery in the near future.  Alysia Penna, MD

## 2017-07-23 HISTORY — PX: HERNIA REPAIR: SHX51

## 2017-09-28 ENCOUNTER — Encounter: Payer: Self-pay | Admitting: Family Medicine

## 2017-09-28 ENCOUNTER — Ambulatory Visit: Payer: Managed Care, Other (non HMO) | Admitting: Family Medicine

## 2017-09-28 VITALS — BP 118/80 | HR 76 | Temp 98.0°F | Wt 162.8 lb

## 2017-09-28 DIAGNOSIS — J018 Other acute sinusitis: Secondary | ICD-10-CM

## 2017-09-28 MED ORDER — HYDROCODONE-HOMATROPINE 5-1.5 MG/5ML PO SYRP: 5.0000 mL | ORAL_SOLUTION | ORAL | 0 refills | Status: DC | PRN

## 2017-09-28 MED ORDER — AZITHROMYCIN 250 MG PO TABS: ORAL_TABLET | ORAL | 0 refills | Status: DC

## 2017-09-28 MED ORDER — METHYLPREDNISOLONE ACETATE 40 MG/ML IJ SUSP
120.0000 mg | Freq: Once | INTRAMUSCULAR | Status: AC
  Administered 2017-09-28: 120 mg via INTRAMUSCULAR

## 2017-09-28 NOTE — Addendum Note (Signed)
Addended by: Myriam Forehand on: 09/28/2017 04:36 PM   Modules accepted: Orders

## 2017-09-28 NOTE — Progress Notes (Signed)
   Subjective:    Patient ID: Richard [facility], male    DOB: 06-03-1956, 62 y.o.   MRN: [id number]  HPI Here for 7 days of sinus pressure, PND, and coughing up yellow sputum.    Review of Systems  Constitutional: Negative.   HENT: Positive for congestion, postnasal drip, sinus pressure and sinus pain. Negative for sore throat.   Eyes: Negative.   Respiratory: Positive for cough.        Objective:   Physical Exam  Constitutional: He appears well-developed and well-nourished.  HENT:  Right Ear: External ear normal.  Left Ear: External ear normal.  Nose: Nose normal.  Mouth/Throat: Oropharynx is clear and moist.  Eyes: Conjunctivae are normal.  Neck: Neck supple. No thyromegaly present.  Pulmonary/Chest: Effort normal and breath sounds normal. No respiratory distress. He has no wheezes. He has no rales.  Lymphadenopathy:    He has no cervical adenopathy.          Assessment & Plan:  Sinusitis, treat with a Zpack and a steroid shot. Written out of work today and tomorrow.  Alysia Penna, MD

## 2017-10-02 ENCOUNTER — Other Ambulatory Visit: Payer: Self-pay | Admitting: Family Medicine

## 2017-10-02 NOTE — Telephone Encounter (Signed)
Call in 10 ml with 5 rf

## 2017-10-02 NOTE — Telephone Encounter (Signed)
Last OV 09/28/2017. Testosterone last tested 02/17/2017. Rx last refilled 03/26/2017 disp 10 ml with 5 refills. Sent to PCP for approval.

## 2017-10-08 ENCOUNTER — Encounter: Payer: Self-pay | Admitting: Family Medicine

## 2017-10-09 ENCOUNTER — Other Ambulatory Visit: Payer: Self-pay

## 2017-10-09 MED ORDER — AMOXICILLIN-POT CLAVULANATE 875-125 MG PO TABS: 1.0000 | ORAL_TABLET | Freq: Two times a day (BID) | ORAL | 0 refills | Status: DC

## 2017-10-09 NOTE — Telephone Encounter (Signed)
Call in Augmentin [phone] bid for 10 days  

## 2017-10-15 NOTE — Telephone Encounter (Signed)
Please write him a work note for the days he has missed

## 2017-10-20 ENCOUNTER — Other Ambulatory Visit: Payer: Self-pay | Admitting: Family Medicine

## 2017-10-20 NOTE — Telephone Encounter (Signed)
Last OV 09/28/2017  See medication is on pt's medication list sent to PCP for approval

## 2017-10-26 ENCOUNTER — Ambulatory Visit: Payer: Managed Care, Other (non HMO) | Admitting: Family Medicine

## 2017-10-26 ENCOUNTER — Encounter: Payer: Self-pay | Admitting: Family Medicine

## 2017-10-26 ENCOUNTER — Emergency Department (HOSPITAL_COMMUNITY)
Admission: EM | Admit: 2017-10-26 | Discharge: 2017-10-26 | Disposition: A | Discharge status: Home / Self Care | Payer: Managed Care, Other (non HMO) | Attending: Emergency Medicine | Admitting: Emergency Medicine

## 2017-10-26 ENCOUNTER — Encounter (HOSPITAL_COMMUNITY): Payer: Self-pay | Admitting: Emergency Medicine

## 2017-10-26 ENCOUNTER — Emergency Department (HOSPITAL_COMMUNITY): Payer: Managed Care, Other (non HMO)

## 2017-10-26 VITALS — BP 142/84 | HR 65 | Temp 98.3°F

## 2017-10-26 DIAGNOSIS — R1032 Left lower quadrant pain: Secondary | ICD-10-CM

## 2017-10-26 DIAGNOSIS — I10 Essential (primary) hypertension: Secondary | ICD-10-CM | POA: Insufficient documentation

## 2017-10-26 DIAGNOSIS — Z87891 Personal history of nicotine dependence: Secondary | ICD-10-CM | POA: Diagnosis not present

## 2017-10-26 DIAGNOSIS — N200 Calculus of kidney: Secondary | ICD-10-CM

## 2017-10-26 DIAGNOSIS — N132 Hydronephrosis with renal and ureteral calculous obstruction: Secondary | ICD-10-CM | POA: Insufficient documentation

## 2017-10-26 DIAGNOSIS — E039 Hypothyroidism, unspecified: Secondary | ICD-10-CM | POA: Diagnosis not present

## 2017-10-26 DIAGNOSIS — R109 Unspecified abdominal pain: Secondary | ICD-10-CM | POA: Diagnosis present

## 2017-10-26 DIAGNOSIS — Z79899 Other long term (current) drug therapy: Secondary | ICD-10-CM | POA: Diagnosis not present

## 2017-10-26 LAB — URINALYSIS, ROUTINE W REFLEX MICROSCOPIC
Bilirubin Urine: NEGATIVE
Glucose, UA: NEGATIVE mg/dL
Hgb urine dipstick: NEGATIVE
Ketones, ur: NEGATIVE mg/dL
Leukocytes, UA: NEGATIVE
Nitrite: NEGATIVE
Protein, ur: NEGATIVE mg/dL
Specific Gravity, Urine: 1.019 (ref 1.005–1.030)
pH: 5 (ref 5.0–8.0)

## 2017-10-26 LAB — BASIC METABOLIC PANEL
Anion gap: 10 (ref 5–15)
BUN: 20 mg/dL (ref 6–20)
CO2: 26 mmol/L (ref 22–32)
Calcium: 9.1 mg/dL (ref 8.9–10.3)
Chloride: 100 mmol/L — ABNORMAL LOW (ref 101–111)
Creatinine, Ser: 1.14 mg/dL (ref 0.61–1.24)
GFR calc Af Amer: >60 mL/min (ref >60)
GFR calc non Af Amer: >60 mL/min (ref >60)
Glucose, Bld: 94 mg/dL (ref 65–99)
Potassium: 3.9 mmol/L (ref 3.5–5.1)
Sodium: 136 mmol/L (ref 135–145)

## 2017-10-26 LAB — CBC
HCT: 37.6 % — ABNORMAL LOW (ref 39.0–52.0)
Hemoglobin: 13 g/dL (ref 13.0–17.0)
MCH: 33.7 pg (ref 26.0–34.0)
MCHC: 34.6 g/dL (ref 30.0–36.0)
MCV: 97.4 fL (ref 78.0–100.0)
Platelets: 324 10*3/uL (ref 150–400)
RBC: 3.86 MIL/uL — ABNORMAL LOW (ref 4.22–5.81)
RDW: 13.3 % (ref 11.5–15.5)
WBC: 13.5 10*3/uL — ABNORMAL HIGH (ref 4.0–10.5)

## 2017-10-26 MED ORDER — HYDROCODONE-ACETAMINOPHEN 5-325 MG PO TABS: 2.0000 | ORAL_TABLET | Freq: Three times a day (TID) | ORAL | 0 refills | Status: DC | PRN

## 2017-10-26 MED ORDER — TAMSULOSIN HCL 0.4 MG PO CAPS: 0.4000 mg | ORAL_CAPSULE | Freq: Every day | ORAL | 0 refills | Status: DC

## 2017-10-26 MED ORDER — MORPHINE SULFATE (PF) 4 MG/ML IV SOLN
4.0000 mg | Freq: Once | INTRAVENOUS | Status: AC
  Administered 2017-10-26: 4 mg via INTRAVENOUS
  Filled 2017-10-26: qty 1

## 2017-10-26 MED ORDER — ONDANSETRON HCL 4 MG PO TABS: 4.0000 mg | ORAL_TABLET | Freq: Three times a day (TID) | ORAL | 0 refills | Status: DC | PRN

## 2017-10-26 MED ORDER — SODIUM CHLORIDE 0.9 % IV BOLUS (SEPSIS)
1000.0000 mL | Freq: Once | INTRAVENOUS | Status: AC
  Administered 2017-10-26: 1000 mL via INTRAVENOUS

## 2017-10-26 MED ORDER — KETOROLAC TROMETHAMINE 60 MG/2ML IM SOLN
60.0000 mg | Freq: Once | INTRAMUSCULAR | Status: AC
  Administered 2017-10-26: 60 mg via INTRAMUSCULAR

## 2017-10-26 NOTE — ED Triage Notes (Signed)
C/o left flank pain that radiates to LLQ for couple days. No urinary problems.

## 2017-10-26 NOTE — Discharge Instructions (Signed)
You were diagnosed with a kidney stone.  You will be sent home with nausea medicine as well as pain medication and Flomax.  He will need to take Flomax once daily until you pass the stone. You may rotate Tylenol and ibuprofen for your pain as well and use the Norco for breakthrough pain.   You should avoid driving, working, or operating heavy machinery while you are taking Norco.  Do not take more than 4 g of acetaminophen in a day.  Please follow-up with Dr. Pilar Jarvis and call the office tomorrow morning to make an appointment for follow up.  Return to the emergency room if you experience any new or worsening symptoms including any fevers, vomiting, worsening pain, or inability to urinate.

## 2017-10-26 NOTE — ED Provider Notes (Signed)
[facility] DEPT Provider Note   CSN: [id number] Arrival date & time: 10/26/17  1324     History   Chief Complaint Chief Complaint  Patient presents with  . Flank Pain  . Abdominal Pain    HPI Richard Sellers is a 62 y.o. male.  HPI   62 y/o male presenting to the Ed c/o 5/10 left flank pain that began about 3 days ago. The pain is intermittent and radiates from the flank and into the RLQ and right groin. Has tried taking some vicodin at home with mild relief. Also reports nausea, but no vomiting.  States he has been able to urinate. Denies dysuria, hematuria, frequency. Denies fevers, chills, vomiting, abdominl distention, chest pain, diarrhea, constipation. No scrotal swelling, pain, or redness. Pt has a h/o nephrolithias. Has not had a stone for 7-8 years.   Past Medical History:  Diagnosis Date  . Allergy   . Anxiety   . Depression   . ED (erectile dysfunction)   . Hypertension   . Warts, genital     Patient Active Problem List   Diagnosis Date Noted  . Bilateral leg edema 02/17/2017  . Hypothyroidism 11/26/2016  . GERD 05/30/2010  . PSORIASIS 05/30/2010  . 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 Essential hypertension 09/25/2008  . Anxiety state 05/05/2008  . Depression, major, single episode, moderate ([place]) 06/02/2007    Past Surgical History:  Procedure Laterality Date  . APPENDECTOMY    . COLONOSCOPY  11/08/2007   per Dr. Sharlett Iles, diverticulosis but no polyps, repeat in 10 yrs   . CYSTECTOMY    . KNEE ARTHROSCOPY     right knee  . TONSILLECTOMY         Home Medications    Prior to Admission medications   Medication Sig Start Date End Date Taking? Authorizing Provider  ALPRAZolam Duanne Moron) 1 MG tablet TAKE ONE TABLET BY MOUTH EVERY 6 HOURS AS NEEDED FOR ANXIETY 06/03/17  Yes Laurey Morale, MD  fluticasone (FLONASE) 50 MCG/ACT nasal spray PLACE 2 SPRAYS INTO THE NOSE DAILY. Patient taking differently: Place 2  sprays into both nostrils daily as needed for allergies.  07/03/16  Yes Laurey Morale, MD  levothyroxine (SYNTHROID, LEVOTHROID) 75 MCG tablet Take 1 tablet (75 mcg total) by mouth daily. 11/25/16  Yes Laurey Morale, MD  temazepam (RESTORIL) 30 MG capsule TAKE ONE CAPSULE BY MOUTH AT BEDTIME AS NEEDED FOR SLEEP 06/03/17  Yes Laurey Morale, MD  amoxicillin-clavulanate (AUGMENTIN) 875-125 MG tablet Take 1 tablet by mouth 2 (two) times daily. Patient not taking: Reported on 10/26/2017 10/09/17   Laurey Morale, MD  azithromycin [facility]) 250 MG tablet As directed Patient not taking: Reported on 10/26/2017 09/28/17   Laurey Morale, MD  diclofenac (VOLTAREN) 50 MG EC tablet Take 1 tablet (50 mg total) by mouth 3 (three) times daily as needed for moderate pain. 03/26/17   Laurey Morale, MD  halobetasol (ULTRAVATE) 0.05 % cream APPLY TOPICALLY TWO TIMES A DAY 09/01/13   Laurey Morale, MD  HYDROcodone-acetaminophen (NORCO/VICODIN) 5-325 MG tablet Take 2 tablets by mouth every 8 (eight) hours as needed for moderate pain. 10/26/17   Shigeru Lampert S, PA-C  HYDROcodone-homatropine (HYDROMET) 5-1.5 MG/5ML syrup Take 5 mLs by mouth every 4 (four) hours as needed. Patient not taking: Reported on 10/26/2017 09/28/17   Laurey Morale, MD  ondansetron (ZOFRAN) 4 MG tablet Take 1 tablet (4 mg total) by mouth every 8 (eight)  hours as needed for nausea or vomiting. 10/26/17   Ludmilla Mcgillis S, PA-C  sildenafil (REVATIO) 20 MG tablet TAKE 1 TABLET (20 MG TOTAL) BY MOUTH AS NEEDED. Patient taking differently: TAKE 1 TABLET (20 MG TOTAL) BY MOUTH AS NEEDED FOR ED 10/20/17   Laurey Morale, MD  SYRINGE-NEEDLE, DISP, 3 ML (SAFETY SYRINGES/NEEDLE) 20G X 1-1/2" 3 ML MISC 1 application by Does not apply route every 14 (fourteen) days. 11/25/16   Laurey Morale, MD  tamsulosin (FLOMAX) 0.4 MG CAPS capsule Take 1 capsule (0.4 mg total) by mouth daily. 10/26/17   Elias Dennington S, PA-C  testosterone cypionate (DEPOTESTOSTERONE CYPIONATE) 200  MG/ML injection INJECT 1 ML (200MG ) INTO THE MUSCLE EVERY 14 DAYS 10/02/17   Laurey Morale, MD    Family History Family History  Problem Relation Age of Onset  . Hypertension Unknown        family hx  . Hyperlipidemia Unknown        family hx    Social History Social History   Tobacco Use  . Smoking status: Former Research scientist (life sciences)  . Smokeless tobacco: Never Used  Substance Use Topics  . Alcohol use: Yes    Alcohol/week: 4.2 oz    Types: 7 Standard drinks or equivalent per week    Comment: couple of beers per day  . Drug use: No     Allergies   Patient has no known allergies.   Review of Systems Review of Systems  Constitutional: Negative for chills and fever.  HENT: Negative for congestion.   Respiratory: Negative for shortness of breath.   Cardiovascular: Negative for chest pain and leg swelling.  Gastrointestinal: Positive for abdominal pain and nausea. Negative for constipation, diarrhea and vomiting.       Flank pain  Genitourinary: Positive for flank pain. Negative for difficulty urinating, dysuria, frequency, hematuria, scrotal swelling and testicular pain.  Musculoskeletal: Negative for back pain.  Skin: Negative for rash.  Neurological: Negative for light-headedness.     Physical Exam Updated Vital Signs BP (!) 139/95 (BP Location: Left Arm)   Pulse 60   Temp 98.4 F (36.9 C) (Oral)   Resp 20   Ht 5\' 8"  (1.727 m)   Wt 76.2 kg (168 lb)   SpO2 99%   BMI 25.54 kg/m   Physical Exam  Constitutional: He appears well-developed and well-nourished. He does not appear ill.  HENT:  Head: Normocephalic and atraumatic.  Mouth/Throat: Oropharynx is clear and moist.  Eyes: Conjunctivae are normal. No scleral icterus.  Neck: Neck supple.  Cardiovascular: Normal rate, regular rhythm, normal heart sounds and intact distal pulses.  No murmur heard. Pulmonary/Chest: Effort normal and breath sounds normal. No respiratory distress. He has no wheezes.  Abdominal: Soft.  Normal appearance and bowel sounds are normal. He exhibits no distension and no ascites. There is no tenderness. There is no rigidity, no rebound and no guarding. No hernia.  Left CVA ttp  Musculoskeletal: He exhibits no edema.  Neurological: He is alert.  Skin: Skin is warm and dry. Capillary refill takes less than 2 seconds.  Psychiatric: He has a normal mood and affect.  Nursing note and vitals reviewed.    ED Treatments / Results  Labs (all labs ordered are listed, but only abnormal results are displayed) Labs Reviewed  CBC - Abnormal; Notable for the following components:      Result Value   WBC 13.5 (*)    RBC 3.86 (*)    HCT 37.6 (*)  All other components within normal limits  BASIC METABOLIC PANEL - Abnormal; Notable for the following components:   Chloride 100 (*)    All other components within normal limits  URINE CULTURE  URINALYSIS, ROUTINE W REFLEX MICROSCOPIC    EKG  EKG Interpretation None       Radiology Ct Renal Stone Study  Result Date: 10/26/2017 CLINICAL DATA:  Severe left lower quadrant and left flank pain that began 3 days ago. EXAM: CT ABDOMEN AND PELVIS WITHOUT CONTRAST TECHNIQUE: Multidetector CT imaging of the abdomen and pelvis was performed following the standard protocol without IV contrast. COMPARISON:  01/01/2010 FINDINGS: Lower chest: No acute abnormality. Hepatobiliary: A few small scattered calcified granulomas noted. No suspicious liver abnormality. The gallbladder appears normal. No biliary dilatation. Pancreas: Unremarkable. No pancreatic ductal dilatation or surrounding inflammatory changes. Spleen: Multiple calcified granulomas identified within the spleen. Adrenals/Urinary Tract: The adrenal glands are normal. Normal appearance of the right kidney. No right kidney stone or hydronephrosis. Urinary bladder appears unremarkable. Left-sided hydronephrosis is identified. Stone at the left UPJ measures 9 mm, image 43 of series 2. Several  nonobstructing calculi noted within the left renal collecting system. The largest is in the inferior pole measuring 1 cm, image 38 of series 2. Urinary bladder appears normal. Stomach/Bowel: The stomach is normal. The small bowel loops have a normal course and caliber. No obstruction. Unremarkable appearance of the colon. Vascular/Lymphatic: Aortic atherosclerosis. No aneurysm. No adenopathy identified within the upper abdomen. No pelvic or inguinal adenopathy identified. Reproductive: Prostate gland measures 4.5 by 5.2 by 4.1 cm (volume = 50 cm^3). Other: Trace free fluid identified within the dependent portion of the pelvis. Musculoskeletal: Degenerative disc disease noted within the lumbar spine. No suspicious bone lesions. IMPRESSION: 1. Left-sided hydronephrosis secondary to 9 mm left UPJ calculus. 2.  Aortic Atherosclerosis (ICD10-I70.0). 3. Prostate gland enlargement Electronically Signed   By: Kerby Moors M.D.   On: 10/26/2017 15:56    Procedures Procedures (including critical care time)  Medications Ordered in ED Medications  sodium chloride 0.9 % bolus 1,000 mL (0 mLs Intravenous Stopped 10/26/17 2224)  morphine 4 MG/ML injection 4 mg (4 mg Intravenous Given 10/26/17 2112)     Initial Impression / Assessment and Plan / ED Course  I have reviewed the triage vital signs and the nursing notes.  Pertinent labs & imaging results that were available during my care of the patient were reviewed by me and considered in my medical decision making (see chart for details).  [facility], Dr. Pilar Jarvis advised pain medication, flomax and d/c to f/u in the office.     reEvaluated pt in the ED. abd continues to be soft and nontender. Pain is well controlled and patient has had no episodes of vomiting. Discussed results and plan for d/c with pain meds, zofran, and flomax. Advised pt to call urology office tomorrow morning to schedule appt and gave strict return precautions for fevers, uncontrolled  pain, inability to urinate or vomiting.  Final Clinical Impressions(s) / ED Diagnoses   Final diagnoses:  Nephrolithiasis   62 y/o male with a h/o nephrolithiasis presenting with a 3 day h/o flank pain concerning for nephrolithiasis. Pt is afebrile with normal heart rate, RR, and spo2. Mildly HTN likely secondary to pain. He is nontoxic and nonseptic appearing. Has had no episodes of vomiting in the ED. And repeat abd exam negative. UA negative for infection. WBC mildly elevated to 13.5, but CBC otherwise reassuring. BMP reassuring with normal Creatinine. CT  renal with Left-sided hydronephrosis secondary to 9 mm left UPJ calculus. Urology consulted and recommended dispo with outpt f/u. Pt is stable to f/u as an outpatient with good return precuations. Pt understands plan and all questions answered.  ED Discharge Orders        Ordered    tamsulosin (FLOMAX) 0.4 MG CAPS capsule  Daily     10/26/17 2246    ondansetron (ZOFRAN) 4 MG tablet  Every 8 hours PRN     10/26/17 2246    HYDROcodone-acetaminophen (NORCO/VICODIN) 5-325 MG tablet  Every 8 hours PRN     10/26/17 2246       Rodney Booze, PA-C 10/27/17 0314    Charlesetta Shanks, MD 10/27/17 [phone]

## 2017-10-26 NOTE — ED Provider Notes (Signed)
Patient placed in Quick Look pathway, seen and evaluated   Chief Complaint:left flank  HPI:  3 DAYS of left colicky flank pain Seen by pcp and sent here for eval  ROS: no hemturia (one)  Physical Exam:   Gen: No distress  Neuro: Awake and Alert  Skin: Warm    Focused Exam: no CVAT   Initiation of care has begun. The patient has been counseled on the process, plan, and necessity for staying for the completion/evaluation, and the remainder of the medical screening examination    Lacretia Leigh, MD 10/26/17 1530

## 2017-10-26 NOTE — Addendum Note (Signed)
Addended by: Myriam Forehand on: 10/26/2017 12:43 PM   Modules accepted: Orders

## 2017-10-26 NOTE — ED Triage Notes (Signed)
Pt comes from doctor's office via EMS with complaints of left lower abdominal area radiating to groin that has worsened with time.  Recently had hernia surgery. Vitals stable in route. 100 mcg of fentanyl in route. 20 G Left forearm.

## 2017-10-26 NOTE — Progress Notes (Signed)
   Subjective:    Patient ID: ARLO BUTT, male    DOB: 27-Aug-1956, 62 y.o.   MRN: [id number]  HPI Here for severe LLQ abdominal and left flank pains that started 3 days ago. They were mild at first but have become much worse. Today it is so severe he took 2 Vicodin tablets he had at home but these have not helped at all. He is nauseated but has not vomited. He is urinating normally and he had a normal BM this morning.    Review of Systems  Constitutional: Negative.   Respiratory: Negative.   Cardiovascular: Negative.   Gastrointestinal: Positive for abdominal pain. Negative for abdominal distention, anal bleeding, blood in stool, constipation, diarrhea, nausea, rectal pain and vomiting.  Genitourinary: Positive for flank pain. Negative for difficulty urinating, dysuria, frequency, hematuria, testicular pain and urgency.       Objective:   Physical Exam  Constitutional: He is oriented to person, place, and time.  Alert, in a lot of pain, bent over at the waist   Neck: No thyromegaly present.  Cardiovascular: Normal rate, regular rhythm, normal heart sounds and intact distal pulses.  Pulmonary/Chest: Effort normal and breath sounds normal. No respiratory distress. He has no wheezes. He has no rales.  Abdominal: Soft. Bowel sounds are normal. He exhibits no distension and no mass. There is no guarding.  He is very tender in the LLQ and left flank with positive rebound tenderness. No back tenderness   Lymphadenopathy:    He has no cervical adenopathy.  Neurological: He is alert and oriented to person, place, and time.          Assessment & Plan:  LLQ abdominal pains, possible etiologies include diverticulitis or a ureteral stone. He is given a Toradol shot and EMS is called to transport him to [facility]. He drove himself here but he is not able to drive at this point.  Alysia Penna, MD

## 2017-10-27 LAB — URINE CULTURE: Culture: NO GROWTH

## 2017-10-28 ENCOUNTER — Emergency Department (HOSPITAL_COMMUNITY)
Admission: EM | Admit: 2017-10-28 | Discharge: 2017-10-28 | Discharge status: Against Medical Advice | Payer: Managed Care, Other (non HMO)

## 2017-10-28 ENCOUNTER — Other Ambulatory Visit: Payer: Self-pay | Admitting: Urology

## 2017-10-28 ENCOUNTER — Encounter (HOSPITAL_COMMUNITY): Payer: Self-pay | Admitting: General Practice

## 2017-10-28 NOTE — ED Notes (Signed)
Pt called to be triaged with no response.  RN notified. 

## 2017-10-28 NOTE — ED Notes (Signed)
Called to be triaged multiple times

## 2017-10-29 ENCOUNTER — Encounter (HOSPITAL_COMMUNITY): Payer: Self-pay | Admitting: *Deleted

## 2017-10-29 ENCOUNTER — Other Ambulatory Visit: Payer: Self-pay

## 2017-10-29 ENCOUNTER — Ambulatory Visit (HOSPITAL_COMMUNITY)
Admission: RE | Admit: 2017-10-29 | Discharge: 2017-10-29 | Disposition: A | Discharge status: Home / Self Care | Payer: Managed Care, Other (non HMO) | Source: Ambulatory Visit | Attending: Urology | Admitting: Urology

## 2017-10-29 ENCOUNTER — Ambulatory Visit (HOSPITAL_COMMUNITY): Payer: Managed Care, Other (non HMO)

## 2017-10-29 ENCOUNTER — Encounter (HOSPITAL_COMMUNITY)
Admission: RE | Disposition: A | Discharge status: Home / Self Care | Payer: Self-pay | Source: Ambulatory Visit | Attending: Urology

## 2017-10-29 DIAGNOSIS — Z87891 Personal history of nicotine dependence: Secondary | ICD-10-CM | POA: Diagnosis not present

## 2017-10-29 DIAGNOSIS — F329 Major depressive disorder, single episode, unspecified: Secondary | ICD-10-CM | POA: Insufficient documentation

## 2017-10-29 DIAGNOSIS — I10 Essential (primary) hypertension: Secondary | ICD-10-CM | POA: Diagnosis not present

## 2017-10-29 DIAGNOSIS — E039 Hypothyroidism, unspecified: Secondary | ICD-10-CM | POA: Diagnosis not present

## 2017-10-29 DIAGNOSIS — F419 Anxiety disorder, unspecified: Secondary | ICD-10-CM | POA: Diagnosis not present

## 2017-10-29 DIAGNOSIS — N201 Calculus of ureter: Secondary | ICD-10-CM | POA: Insufficient documentation

## 2017-10-29 HISTORY — DX: Personal history of urinary calculi: Z87.442

## 2017-10-29 HISTORY — DX: Cardiac murmur, unspecified: R01.1

## 2017-10-29 HISTORY — DX: Hypothyroidism, unspecified: E03.9

## 2017-10-29 HISTORY — DX: Other specified postprocedural states: Z98.890

## 2017-10-29 HISTORY — PX: EXTRACORPOREAL SHOCK WAVE LITHOTRIPSY: SHX1557

## 2017-10-29 HISTORY — DX: Other specified postprocedural states: R11.2

## 2017-10-29 HISTORY — DX: Nausea with vomiting, unspecified: R11.2

## 2017-10-29 SURGERY — LITHOTRIPSY, ESWL
Anesthesia: LOCAL | Laterality: Left

## 2017-10-29 MED ORDER — DIAZEPAM 5 MG PO TABS
10.0000 mg | ORAL_TABLET | ORAL | Status: AC
  Administered 2017-10-29: 10 mg via ORAL
  Filled 2017-10-29: qty 2

## 2017-10-29 MED ORDER — LEVOFLOXACIN 500 MG PO TABS
500.0000 mg | ORAL_TABLET | ORAL | Status: AC
  Administered 2017-10-29: 500 mg via ORAL
  Filled 2017-10-29: qty 1

## 2017-10-29 MED ORDER — DIPHENHYDRAMINE HCL 25 MG PO CAPS
25.0000 mg | ORAL_CAPSULE | ORAL | Status: AC
  Administered 2017-10-29: 25 mg via ORAL
  Filled 2017-10-29: qty 1

## 2017-10-29 MED ORDER — SODIUM CHLORIDE 0.9 % IV SOLN
INTRAVENOUS | Status: DC
  Administered 2017-10-29: 13:00:00 via INTRAVENOUS

## 2017-10-29 SURGICAL SUPPLY — 2 items
COVER SURGICAL LIGHT HANDLE (MISCELLANEOUS) ×2 IMPLANT
TOWEL OR 17X26 10 PK STRL BLUE (TOWEL DISPOSABLE) ×2 IMPLANT

## 2017-10-29 NOTE — H&P (Signed)
H&P  Chief Complaint: Left-sided kidney stone  History of Present Illness: 62 year old male seen yesterday with an 8 x 9 mm left proximal ureteral stone and significant pain.  The patient also has a lower left renal stone that is not obstructing.  Due to the patient's severe pain over the past several days, as well as the size and location of the stone, it was recommended that he have treatment.  I discussed treatment options with him including percutaneous nephrolithotomy removing the stone burden from the kidney and the ureter, ureteroscopic management with holmium laser lithotripsy and extraction with stent placement, as well as shockwave lithotripsy.  Risks and complications as well as expected stone free rates of each of these procedures was discussed with the patient.  He has chosen to have shockwave lithotripsy.  Is good to stone distance is under 12 cm.  The Hounsfield unit of the stone 990.  Past Medical History:  Diagnosis Date  . Allergy   . Anxiety   . Depression   . ED (erectile dysfunction)   . Heart murmur    at age 57 years old, heard x1 , never again  . History of kidney stones   . Hypertension   . Hypothyroidism   . Warts, genital     Past Surgical History:  Procedure Laterality Date  . APPENDECTOMY    . COLONOSCOPY  11/08/2007   per Dr. Sharlett Iles, diverticulosis but no polyps, repeat in 10 yrs   . CYSTECTOMY    . HERNIA REPAIR  95/6213   umbilical hernia  . KNEE ARTHROSCOPY     right knee  . TONSILLECTOMY      Home Medications:  Allergies as of 10/29/2017   No Known Allergies     Medication List    Notice   Cannot display discharge medications because the patient has not yet been admitted.     Allergies: No Known Allergies  Family History  Problem Relation Age of Onset  . Hypertension Unknown        family hx  . Hyperlipidemia Unknown        family hx    Social History:  reports that he has quit smoking. he has never used smokeless tobacco. He  reports that he drinks about 4.2 oz of alcohol per week. He reports that he does not use drugs.  ROS: A complete review of systems was performed.  All systems are negative except for pertinent findings as noted.  Physical Exam:  Vital signs in last 24 hours: Weight:  [76.2 kg (168 lb)] 76.2 kg (168 lb) (02/06 1429) Constitutional:  Alert and oriented, No acute distress Cardiovascular: Regular rate and rhythm, No JVD Respiratory: Normal respiratory effort, Lungs clear bilaterally GI: Abdomen is soft, nontender, nondistended, no abdominal masses Genitourinary: No CVAT. Normal male phallus, testes are descended bilaterally and non-tender and without masses, scrotum is normal in appearance without lesions or masses, perineum is normal on inspection. Lymphatic: No lymphadenopathy Neurologic: Grossly intact, no focal deficits Psychiatric: Normal mood and affect  Laboratory Data:  Recent Labs    10/26/17 2042  WBC 13.5*  HGB 13.0  HCT 37.6*  PLT 324    Recent Labs    10/26/17 2042  NA 136  K 3.9  CL 100*  GLUCOSE 94  BUN 20  CALCIUM 9.1  CREATININE 1.14     No results found for this or any previous visit (from the past 24 hour(s)). Recent Results (from the past 240 hour(s))  Urine Culture  Status: None   Collection Time: 10/26/17  3:21 PM  Result Value Ref Range Status   Specimen Description   Final    URINE, CLEAN CATCH Performed at [facility], [place] [address] [place], [place] [address]    Special Requests   Final    NONE Performed at [facility], [place] [address], [place], [place] [address]    Culture   Final    NO GROWTH Performed at [facility], [place] [address], [place], [place] [address]    Report Status 10/27/2017 FINAL  Final    Renal Function: Recent Labs    10/26/17 2042  CREATININE 1.14   Estimated Creatinine Clearance: 65.8 mL/min (by C-G formula based on SCr of 1.14 mg/dL).  Radiologic  Imaging: No results found.  Impression/Assessment:  8 x 9 mm left proximal ureteral stone with significant pain over the past several days  Plan:  Extracorporeal shockwave lithotripsy.  The procedure as well as risks and complications have been discussed with him.  These include but are not limited to failure of fragmentation and passage of the stone, hematoma, infection, possibility of 20% of patients like him needing retreatment.  He understands these and desires to proceed.

## 2017-10-29 NOTE — Discharge Instructions (Signed)
See [facility] discharge instructions in chart.  OK to continue tamsulosin for a few days--will help stone fragments pass  If pain med needed, ok to take either hydromorphone (dilaudid) of hydrocodone (vicodin), but not both  If mild/moderate pain, try taking 600-800 mg ibuprofen instead of narcotics. OK to take that 3 times a day

## 2017-10-30 ENCOUNTER — Encounter (HOSPITAL_COMMUNITY): Payer: Self-pay | Admitting: Urology

## 2017-11-04 ENCOUNTER — Telehealth: Payer: Self-pay | Admitting: Family Medicine

## 2017-11-04 NOTE — Telephone Encounter (Signed)
Copied from [place] [id number]. Topic: Quick Communication - Rx Refill/Question >> Nov 04, 2017 12:00 PM Isam Unrein, Doylene Canard E, NT wrote: Medication: temazepam (RESTORIL) 30 MG capsule. Pharmacy wanted  to see if the doctor would change prescription 2 15 mg a day. Pharmacy said it would be 3 months to get regular dosage.    Has the patient contacted their pharmacy? Yes   (Agent: If no, request that the patient contact the pharmacy for the refill.)  [facility] [address] [place] [phone] (Phone) [phone] (Fax)   Preferred Pharmacy (with phone number or street name):    Agent: Please be advised that RX refills may take up to 3 business days. We ask that you follow-up with your pharmacy.

## 2017-11-09 NOTE — Telephone Encounter (Signed)
Sir please see below message; ok to change Rx?

## 2017-11-09 NOTE — Telephone Encounter (Signed)
Change to Temazepam 15 mg to take 2 tabs qhs, #60 with 5 rf

## 2017-11-11 MED ORDER — TEMAZEPAM 15 MG PO CAPS: 30.0000 mg | ORAL_CAPSULE | Freq: Every evening | ORAL | 5 refills | Status: DC | PRN

## 2017-11-11 NOTE — Telephone Encounter (Signed)
Called in Rx for the 15 MG dose. Called pt to make them aware that this was done. Pt advised that this was done and voiced understanding.

## 2017-11-16 ENCOUNTER — Ambulatory Visit: Payer: Self-pay

## 2017-11-16 NOTE — Telephone Encounter (Signed)
Pt. Called with d/o of feeling more depressed and anxious.  Requested order for Welbutrin that he had been on in the past.  Related hx of breaking up with his partner, and that his partner just moved out, and is in relationship with someone else.  Stated he has been trying to cope with his depression and anxiety since the beginning of the year.  C/o lack of energy and motivation, feeling anxious all the time, difficulty concentrating, and feeling hopeless.  Reported he had a panic attack last night.  Stated he does not like to be in the house alone; described that it makes him feel "isolated".  Denied any feelings of harming himself or others.  Stated he has a Therapy session scheduled on Sunday, 11/22/17.  Advised that he should be seen by his PCP within 24 hrs.  The pt. Requested to ask Dr. Sarajane Jews if he would be willing to prescribe the Welbutrin, since he knows him well, and knows his situation.    Spoke with FC at [employer]  Will send Triage note to office for Dr. Sarajane Jews to review.  Pt. Agrees with plan.      Reason for Disposition . [1] Depression AND [2] worsening (e.g.,sleeping poorly, less able to do activities of daily living)  Answer Assessment - Initial Assessment Questions 1. CONCERN: "What happened that made you call today?"     Feeling more depressed since the 1st of the year; more anxious, panic attacks, feeling down and isolated 2. DEPRESSION SYMPTOM SCREENING: "How are you feeling overall?" (e.g., decreased energy, increased sleeping or difficulty sleeping, difficulty concentrating, feelings of sadness, guilt, hopelessness, or worthlessness)     Lack of energy; lack of motivation, feels anxious all the time, difficulty concentrating, hopeless  3. RISK OF HARM - SUICIDAL IDEATION:  "Do you ever have thoughts of hurting or killing yourself?"  (e.g., yes, no, no but preoccupation with thoughts about death)   - INTENT:  "Do you have thoughts of hurting or killing yourself right NOW?"  (e.g., yes, no, N/A)   - PLAN: "Do you have a specific plan for how you would do this?" (e.g., gun, knife, overdose, no plan, N/A)    Denies any plan to harm himself 4. RISK OF HARM - HOMICIDAL IDEATION:  "Do you ever have thoughts of hurting or killing someone else?"  (e.g., yes, no, no but preoccupation with thoughts about death)   - INTENT:  "Do you have thoughts of hurting or killing someone right NOW?" (e.g., yes, no, N/A)   - PLAN: "Do you have a specific plan for how you would do this?" (e.g., gun, knife, no plan, N/A)      Denies any plan to harm anyone else 5. FUNCTIONAL IMPAIRMENT: "How have things been going for you overall in your life? Have you had any more difficulties than usual doing your normal daily activities?"  (e.g., better, same, worse; self-care, school, work, interactions)    [place] up with partner last June 6. SUPPORT: "Who is with you now?" "Who do you live with?" "Do you have family or friends nearby who you can talk to?"      A few friends ; most of them all work and are busy with their lives.  7. THERAPIST: "Do you have a counselor or therapist? Name?"     Will be meeting with a Therapist on Sun. ; it will be 1st session this year.  8. STRESSORS: "Has there been any new stress or recent changes in  your life?"     His partner moved out recently in the last month; was used to having someone in the house, but now feels very isolated. 9. DRUG ABUSE/ALCOHOL: "Do you drink alcohol or use any illegal drugs?"      Denies any substance abuse 10. OTHER: "Do you have any other health or medical symptoms right now?" (e.g., fever)       Doesn't want to be alone; denied any additional health issues; recent tx. For kidney stone  11. PREGNANCY: "Is there any chance you are pregnant?" "When was your last menstrual period?"       n/a  Protocols used: DEPRESSION-A-AH

## 2017-11-17 ENCOUNTER — Other Ambulatory Visit: Payer: Self-pay | Admitting: Family Medicine

## 2017-11-17 MED ORDER — BUPROPION HCL ER (XL) 150 MG PO TB24: 150.0000 mg | ORAL_TABLET | Freq: Every day | ORAL | 5 refills | Status: DC

## 2017-11-17 NOTE — Telephone Encounter (Signed)
Call in Wellbutrin XL 150 mg daily, #30 with 5 rf  

## 2017-11-17 NOTE — Telephone Encounter (Signed)
Rx previously ordered yesterday was ordered under order mode "Phone In" but was not phoned to pharmacy. New rx sent electronically to pharmacy on file and patient notified by [facility] staff.

## 2017-11-17 NOTE — Telephone Encounter (Signed)
Copied from [place] [id number]. Topic: Quick Communication - Rx Refill/Question >> Nov 17, 2017  1:54 PM Oliver Pila B wrote: Pt called to check status, and pt informed that Dr. Sarajane Jews told nurse to call it in  Medication: wellbutrin   Has the patient contacted their pharmacy? Yes.     (Agent: If no, request that the patient contact the pharmacy for the refill.)   Preferred Pharmacy (with phone number or street name): [facility]   Agent: Please be advised that RX refills may take up to 3 business days. We ask that you follow-up with your pharmacy.

## 2017-11-17 NOTE — Telephone Encounter (Signed)
Called and spoke with pt. He stated that he picked up the Rx today for the 150 MG dose of the Wellbutrin. Pt stated that before he was taking the 150 MG twice daily (300 MG) pt would like this change to be made.   Sent to PCP for approval (dose change)

## 2017-11-17 NOTE — Telephone Encounter (Signed)
Called and spoke with pt. Pt advised and voiced understanding.  

## 2017-11-17 NOTE — Telephone Encounter (Signed)
He needs to titrate up slowly on this. Take this once a day for a few weeks and then we can go up to BID again

## 2017-11-17 NOTE — Telephone Encounter (Signed)
Called pt was unable to leave a VM pt's mailbox has not been set up yet. Medication has been sent into pt's pharmacy. Tried to called pt twice.

## 2017-11-28 ENCOUNTER — Encounter: Payer: Self-pay | Admitting: Gastroenterology

## 2017-12-02 ENCOUNTER — Other Ambulatory Visit: Payer: Self-pay | Admitting: Family Medicine

## 2017-12-03 ENCOUNTER — Encounter: Payer: Self-pay | Admitting: Family Medicine

## 2017-12-03 NOTE — Telephone Encounter (Signed)
Last OV 10/26/2017   Last refilled 06/03/2017 disp 120 with 5 refills   Sent to PCP for approval

## 2017-12-07 ENCOUNTER — Other Ambulatory Visit: Payer: Self-pay

## 2017-12-07 MED ORDER — BUPROPION HCL ER (XL) 300 MG PO TB24: 300.0000 mg | ORAL_TABLET | Freq: Every day | ORAL | 3 refills | Status: DC

## 2017-12-07 NOTE — Telephone Encounter (Signed)
This was done.

## 2017-12-07 NOTE — Telephone Encounter (Signed)
Call in #120 with 5 rf 

## 2017-12-07 NOTE — Telephone Encounter (Signed)
Increase the Wellbutrin XL to 300 mg daily. Call in #30 with 5 rf

## 2017-12-07 NOTE — Telephone Encounter (Signed)
Relation to pt: self Call back number: [phone]    Reason for call:  Patient checking on the status of MyChart message, patient would like a response via phone call, please advise

## 2017-12-10 ENCOUNTER — Telehealth: Payer: Self-pay

## 2017-12-10 NOTE — Telephone Encounter (Signed)
Call in #90 with 3 rf  

## 2017-12-10 NOTE — Telephone Encounter (Signed)
Fax from [facility]   RF request for Flomax 0.4 MG  Cap.   Last OV 10/26/2017   Last refilled 10/26/2017 disp 30 with no refills   Sent to PCP for approval

## 2017-12-11 MED ORDER — TAMSULOSIN HCL 0.4 MG PO CAPS: 0.4000 mg | ORAL_CAPSULE | Freq: Every day | ORAL | 3 refills | Status: DC

## 2017-12-11 NOTE — Telephone Encounter (Signed)
Rx was sent into pharmacy electronically

## 2017-12-14 ENCOUNTER — Ambulatory Visit: Payer: Managed Care, Other (non HMO) | Admitting: Family Medicine

## 2017-12-14 ENCOUNTER — Encounter: Payer: Self-pay | Admitting: Family Medicine

## 2017-12-14 VITALS — BP 122/76 | HR 73 | Temp 98.1°F | Ht 68.0 in

## 2017-12-14 DIAGNOSIS — F321 Major depressive disorder, single episode, moderate: Secondary | ICD-10-CM | POA: Diagnosis not present

## 2017-12-14 DIAGNOSIS — F411 Generalized anxiety disorder: Secondary | ICD-10-CM | POA: Diagnosis not present

## 2017-12-14 MED ORDER — CITALOPRAM HYDROBROMIDE 20 MG PO TABS: 20.0000 mg | ORAL_TABLET | Freq: Every day | ORAL | 3 refills | Status: DC

## 2017-12-14 NOTE — Progress Notes (Signed)
   Subjective:    Patient ID: Richard Sellers, male    DOB: 19-May-1956, 62 y.o.   MRN: [id number]  HPI Here to follow up on anxiety and depression. He has been struggling with sadness, hopelessness, and worrying about things since the breakup with his former partner. We have increased the dose of Wellbutrin to 300 mg daily but this has not made much of a difference. He has found a therapist and has met with him several times. The therapist suggested he try Celexa. He has had troublle sleeping. He has some suicidal thoughts but he denies any plan or intention of acting on these.    Review of Systems  Constitutional: Negative.   Respiratory: Negative.   Cardiovascular: Negative.   Neurological: Negative.   Psychiatric/Behavioral: Positive for decreased concentration, dysphoric mood, sleep disturbance and suicidal ideas. Negative for agitation, behavioral problems, confusion, hallucinations and self-injury. The patient is nervous/anxious.        Objective:   Physical Exam  Constitutional: He is oriented to person, place, and time. He appears well-developed and well-nourished.  Cardiovascular: Normal rate, regular rhythm, normal heart sounds and intact distal pulses.  Pulmonary/Chest: Effort normal and breath sounds normal. No respiratory distress. He has no wheezes. He has no rales.  Neurological: He is alert and oriented to person, place, and time.  Psychiatric: His behavior is normal. Judgment and thought content normal.  Depressed but good eye contact           Assessment & Plan:  Depression with anxiety. Add Celexa 20 mg daily to the Wellbutrin XL. Use Xanax prn. Recheck at his well exam in 3 weeks. We spent 45 minutes together discussing these issues.  Alysia Penna, MD

## 2017-12-16 ENCOUNTER — Encounter: Payer: Self-pay | Admitting: Family Medicine

## 2017-12-16 ENCOUNTER — Ambulatory Visit: Payer: Managed Care, Other (non HMO) | Admitting: Family Medicine

## 2017-12-16 VITALS — BP 140/80 | HR 65 | Temp 98.3°F | Ht 68.0 in

## 2017-12-16 DIAGNOSIS — F321 Major depressive disorder, single episode, moderate: Secondary | ICD-10-CM

## 2017-12-16 DIAGNOSIS — R110 Nausea: Secondary | ICD-10-CM

## 2017-12-16 MED ORDER — ONDANSETRON HCL 4 MG PO TABS: 4.0000 mg | ORAL_TABLET | Freq: Three times a day (TID) | ORAL | 5 refills | Status: DC | PRN

## 2017-12-16 NOTE — Progress Notes (Signed)
   Subjective:    Patient ID: Richard Sellers, male    DOB: [birth year], 62 y.o.   MRN: [id number]  HPI Here to discuss 2 days of intermittent nausea without vomiting. No fever or pain. BMs are regular. No cough or ST. We saw him a few days ago for depression with anxiety and we added Celexa 20 mg a day to the Wellbutrin XL he had been taking. He has taken these together in the mornings.    Review of Systems  Constitutional: Negative.   Cardiovascular: Negative.   Gastrointestinal: Positive for nausea. Negative for abdominal distention, abdominal pain, blood in stool, constipation, diarrhea and vomiting.  Neurological: Negative.   Psychiatric/Behavioral: Positive for dysphoric mood. The patient is nervous/anxious.        Objective:   Physical Exam  Constitutional: He is oriented to person, place, and time. He appears well-developed and well-nourished. No distress.  Cardiovascular: Normal rate, regular rhythm, normal heart sounds and intact distal pulses.  Pulmonary/Chest: Effort normal and breath sounds normal. No respiratory distress. He has no wheezes. He has no rales.  Abdominal: Soft. Bowel sounds are normal. He exhibits no distension and no mass. There is no tenderness. There is no rebound and no guarding.  Neurological: He is alert and oriented to person, place, and time.          Assessment & Plan:  His nausea seems to be a side effect if the medications. I suggested he change the dosing of Celexa to taking 1/2 tab a day for 7 days and then titrate up to a full tablet. Also take this at bedtime instead of morning. Use Zofran prn nausea.  Alysia Penna, MD

## 2017-12-17 ENCOUNTER — Telehealth: Payer: Self-pay | Admitting: Family Medicine

## 2017-12-17 NOTE — Telephone Encounter (Signed)
Copied from [place] [id number]. Topic: Quick Communication - See Telephone Encounter >> Dec 17, 2017 10:13 AM Cleaster Corin, NT wrote: CRM for notification. See Telephone encounter for: 12/17/17. Pt. Calling back waiting to hear from nurse or Dr. Sarajane Jews on what to do next. Pt. Said he isn't feeling any better

## 2017-12-17 NOTE — Telephone Encounter (Signed)
Pt does want to continue the Wellbutrin feels as if it is making his anxiety worse. Sent to PCP see pt's Mychart message as well.

## 2017-12-18 NOTE — Telephone Encounter (Signed)
Pt would like to know what Dr Sarajane Jews recommends asap, pt has to go to work at 10 am today

## 2017-12-18 NOTE — Telephone Encounter (Signed)
Called and spoke with pt. Pt advised and voiced understanding. I have removed Wellbutrin from pt's medication list.

## 2017-12-18 NOTE — Telephone Encounter (Signed)
Called and spoke with pt. He stated that he has completely stopped taking the Wellbutrin he doesn't feel like it was helping it seemed to be making things worse. Since he stopped taking the medication he does feel somewhat better.  Pt would like to know is it OK that he stopped the Wellbutrin cold [place] or should he taper off this medication. OK to continue Celexa without the Wellbutrin?   Sent to PCP to advise

## 2017-12-18 NOTE — Telephone Encounter (Signed)
He recently started on Celexa and it will take 2-3 weeks to be fully effective. Hopefully he can stick it out for another week or so to see if this helps

## 2017-12-18 NOTE — Telephone Encounter (Signed)
It is okay to just stop the Wellbutrin. I would stay on Celexa however to give this a chance to work

## 2017-12-18 NOTE — Telephone Encounter (Signed)
This was answered via another call

## 2017-12-21 ENCOUNTER — Encounter: Payer: Self-pay | Admitting: Family Medicine

## 2017-12-22 ENCOUNTER — Encounter: Payer: Self-pay | Admitting: Family Medicine

## 2017-12-22 ENCOUNTER — Ambulatory Visit: Payer: Managed Care, Other (non HMO) | Admitting: Family Medicine

## 2017-12-22 VITALS — BP 118/80 | HR 62 | Temp 98.4°F | Ht 68.0 in

## 2017-12-22 DIAGNOSIS — R509 Fever, unspecified: Secondary | ICD-10-CM

## 2017-12-22 DIAGNOSIS — J019 Acute sinusitis, unspecified: Secondary | ICD-10-CM

## 2017-12-22 LAB — CBC WITH DIFFERENTIAL/PLATELET
Basophils Absolute: 0 10*3/uL (ref 0.0–0.1)
Basophils Relative: 0.5 % (ref 0.0–3.0)
Eosinophils Absolute: 0 10*3/uL (ref 0.0–0.7)
Eosinophils Relative: 0.5 % (ref 0.0–5.0)
HCT: 41.8 % (ref 39.0–52.0)
Hemoglobin: 14.2 g/dL (ref 13.0–17.0)
Lymphocytes Relative: 20.3 % (ref 12.0–46.0)
Lymphs Abs: 1.8 10*3/uL (ref 0.7–4.0)
MCHC: 34.1 g/dL (ref 30.0–36.0)
MCV: 96.7 fl (ref 78.0–100.0)
Monocytes Absolute: 0.5 10*3/uL (ref 0.1–1.0)
Monocytes Relative: 5.5 % (ref 3.0–12.0)
Neutro Abs: 6.4 10*3/uL (ref 1.4–7.7)
Neutrophils Relative %: 73.2 % (ref 43.0–77.0)
Platelets: 316 10*3/uL (ref 150.0–400.0)
RBC: 4.32 Mil/uL (ref 4.22–5.81)
RDW: 13.7 % (ref 11.5–15.5)
WBC: 8.7 10*3/uL (ref 4.0–10.5)

## 2017-12-22 LAB — BASIC METABOLIC PANEL
BUN: 18 mg/dL (ref 6–23)
CO2: 32 mEq/L (ref 19–32)
Calcium: 9.2 mg/dL (ref 8.4–10.5)
Chloride: 102 mEq/L (ref 96–112)
Creatinine, Ser: 0.79 mg/dL (ref 0.40–1.50)
GFR: 105.87 mL/min (ref >60.00)
Glucose, Bld: 99 mg/dL (ref 70–99)
Potassium: 4.6 mEq/L (ref 3.5–5.1)
Sodium: 139 mEq/L (ref 135–145)

## 2017-12-22 LAB — TSH: TSH: 0.62 u[IU]/mL (ref 0.35–4.50)

## 2017-12-22 MED ORDER — AZITHROMYCIN 250 MG PO TABS: ORAL_TABLET | ORAL | 0 refills | Status: DC

## 2017-12-22 NOTE — Progress Notes (Signed)
   Subjective:    Patient ID: Richard Sellers, male    DOB: 1956/05/23, 62 y.o.   MRN: [id number]  HPI Here for a week of nausea without vomiting, low grade fevers, sweats, sinus pressure, PND, and a ST.    Review of Systems  Constitutional: Positive for diaphoresis and fever. Negative for chills.  HENT: Positive for congestion, postnasal drip, sinus pressure and sore throat. Negative for sinus pain.   Eyes: Negative.   Respiratory: Negative.   Gastrointestinal: Positive for nausea. Negative for abdominal pain, constipation, diarrhea and vomiting.       Objective:   Physical Exam  Constitutional: He appears well-developed and well-nourished.  HENT:  Right Ear: External ear normal.  Left Ear: External ear normal.  Nose: Nose normal.  Mouth/Throat: Oropharynx is clear and moist.  Eyes: Conjunctivae are normal.  Neck: No thyromegaly present.  Pulmonary/Chest: Effort normal and breath sounds normal. No respiratory distress. He has no wheezes. He has no rales.  Lymphadenopathy:    He has no cervical adenopathy.          Assessment & Plan:  Sinusitis, treat with a Zpack. Get labs today. Written out of work yesterday and today.  Alysia Penna, MD

## 2017-12-23 LAB — HIV ANTIBODY (ROUTINE TESTING W REFLEX): HIV 1&2 Ab, 4th Generation: NONREACTIVE

## 2017-12-30 ENCOUNTER — Encounter: Payer: Self-pay | Admitting: Family Medicine

## 2018-01-01 MED ORDER — CITALOPRAM HYDROBROMIDE 40 MG PO TABS: 40.0000 mg | ORAL_TABLET | Freq: Every day | ORAL | 5 refills | Status: DC

## 2018-01-01 NOTE — Telephone Encounter (Signed)
Yes he can double the Celexa to 40 mg a day. Call in #30 with 5 rf

## 2018-01-07 ENCOUNTER — Encounter: Payer: Managed Care, Other (non HMO) | Admitting: Family Medicine

## 2018-01-07 DIAGNOSIS — Z0289 Encounter for other administrative examinations: Secondary | ICD-10-CM

## 2018-01-11 ENCOUNTER — Ambulatory Visit: Payer: Managed Care, Other (non HMO) | Admitting: Family Medicine

## 2018-01-11 ENCOUNTER — Encounter: Payer: Self-pay | Admitting: Family Medicine

## 2018-01-11 VITALS — BP 160/110 | HR 71 | Temp 98.7°F

## 2018-01-11 DIAGNOSIS — A084 Viral intestinal infection, unspecified: Secondary | ICD-10-CM | POA: Diagnosis not present

## 2018-01-11 NOTE — Progress Notes (Signed)
   Subjective:    Patient ID: QUINDARRIUS JOPLIN, male    DOB: 04-Jul-1956, 62 y.o.   MRN: [id number]  HPI Here for several days of abdominal cramps with nausea and vomiting. No fever. He feels better today. Drinking fluids but his appetite is down. He has Zofran at home to use prn.    Review of Systems  Constitutional: Negative.   Respiratory: Negative.   Cardiovascular: Negative.   Gastrointestinal: Positive for abdominal pain, diarrhea, nausea and vomiting. Negative for abdominal distention, anal bleeding, blood in stool and constipation.       Objective:   Physical Exam  Constitutional: He appears well-developed and well-nourished. No distress.  Cardiovascular: Normal rate, regular rhythm, normal heart sounds and intact distal pulses.  Pulmonary/Chest: Effort normal and breath sounds normal. No respiratory distress. He has no wheezes. He has no rales.  Abdominal: Soft. Bowel sounds are normal. He exhibits no distension and no mass. There is no tenderness. There is no rebound and no guarding.          Assessment & Plan:  Viral enteritis, resolving. Use Zofran prn and drink fluids. Written out of work yesterday and today.  Alysia Penna, MD

## 2018-01-25 ENCOUNTER — Encounter: Payer: Self-pay | Admitting: Family Medicine

## 2018-01-26 NOTE — Telephone Encounter (Signed)
Increase the Celexa to 40 mg BID for 2 weeks

## 2018-01-28 ENCOUNTER — Other Ambulatory Visit: Payer: Self-pay | Admitting: Family Medicine

## 2018-02-10 ENCOUNTER — Other Ambulatory Visit: Payer: Self-pay | Admitting: Family Medicine

## 2018-03-08 ENCOUNTER — Ambulatory Visit: Payer: Managed Care, Other (non HMO) | Admitting: Family Medicine

## 2018-03-08 ENCOUNTER — Encounter: Payer: Self-pay | Admitting: Family Medicine

## 2018-03-08 VITALS — BP 150/92 | HR 69 | Temp 98.6°F | Ht 68.0 in

## 2018-03-08 DIAGNOSIS — J019 Acute sinusitis, unspecified: Secondary | ICD-10-CM | POA: Diagnosis not present

## 2018-03-08 MED ORDER — CITALOPRAM HYDROBROMIDE 40 MG PO TABS
40.0000 mg | ORAL_TABLET | Freq: Two times a day (BID) | ORAL | 3 refills | Status: DC
Start: 2018-03-08 — End: 2018-03-22

## 2018-03-08 MED ORDER — AMOXICILLIN-POT CLAVULANATE 875-125 MG PO TABS: 1.0000 | ORAL_TABLET | Freq: Two times a day (BID) | ORAL | 0 refills | Status: DC

## 2018-03-08 MED ORDER — LEVOTHYROXINE SODIUM 75 MCG PO TABS: 75.0000 ug | ORAL_TABLET | Freq: Every day | ORAL | 3 refills | Status: DC

## 2018-03-08 MED ORDER — HYDROCODONE-HOMATROPINE 5-1.5 MG/5ML PO SYRP: 5.0000 mL | ORAL_SOLUTION | ORAL | 0 refills | Status: DC | PRN

## 2018-03-08 MED ORDER — METHYLPREDNISOLONE ACETATE 80 MG/ML IJ SUSP
120.0000 mg | Freq: Once | INTRAMUSCULAR | Status: AC
  Administered 2018-03-08: 120 mg via INTRAMUSCULAR

## 2018-03-08 NOTE — Addendum Note (Signed)
Addended by: Myriam Forehand on: 03/08/2018 04:33 PM   Modules accepted: Orders

## 2018-03-08 NOTE — Progress Notes (Signed)
   Subjective:    Patient ID: JOCELYN [name], male    DOB: [birth year], 62 y.o.   MRN: [id number]  HPI Here for one week of sinus pressure, PND, and a dry cough. No fever. On Mucinex.    Review of Systems  Constitutional: Negative.   HENT: Positive for congestion, postnasal drip, sinus pressure, sinus pain and sore throat.   Eyes: Negative.   Respiratory: Positive for cough.        Objective:   Physical Exam  Constitutional: He appears well-developed and well-nourished.  HENT:  Right Ear: External ear normal.  Left Ear: External ear normal.  Nose: Nose normal.  Mouth/Throat: Oropharynx is clear and moist.  Eyes: Conjunctivae are normal.  Neck: No thyromegaly present.  Pulmonary/Chest: Effort normal and breath sounds normal. No stridor. No respiratory distress. He has no wheezes. He has no rales.  Lymphadenopathy:    He has no cervical adenopathy.          Assessment & Plan:  Sinusitis, treat with Augmentin. Written out of work today and tomorrow.  Alysia Penna, MD

## 2018-03-10 ENCOUNTER — Telehealth: Payer: Self-pay | Admitting: Family Medicine

## 2018-03-10 NOTE — Telephone Encounter (Signed)
Copied from [place] [id number]. Topic: Quick Communication - See Telephone Encounter >> Mar 10, 2018  8:58 AM [name] Prima L wrote: CRM for notification. See Telephone encounter for: 03/10/18.  Patient wants Dr Sarajane Jews to know that he has coughed so much that he has pulled a muscle. He was seen in the office on Monday 6/17. He wants to know what he needs to do. Call back @ [phone]

## 2018-03-10 NOTE — Telephone Encounter (Signed)
Spoke with Dr. Sarajane Jews given the verbal OK   Letter is ready for pick up. Pt advised and note has been placed up front.

## 2018-03-10 NOTE — Telephone Encounter (Signed)
Copied from [place] [id number]. Topic: Inquiry >> Mar 10, 2018  8:56 AM [name] Prima L wrote: Reason for CRM: Patient states that he was in the office on Monday 6/17. He said that Dr Sarajane Jews took him out of work until today ( Wednesday ) he said that he still does not feel better and that he needs a note to be out of work until Friday.

## 2018-03-15 ENCOUNTER — Telehealth: Payer: Self-pay | Admitting: *Deleted

## 2018-03-15 NOTE — Telephone Encounter (Signed)
Prior auth for Citalopram Hydrobromide 40mg  sent to [email or web address].

## 2018-03-16 ENCOUNTER — Ambulatory Visit: Payer: Managed Care, Other (non HMO) | Admitting: Family Medicine

## 2018-03-16 ENCOUNTER — Encounter: Payer: Self-pay | Admitting: Family Medicine

## 2018-03-16 ENCOUNTER — Other Ambulatory Visit: Payer: Self-pay | Admitting: Family Medicine

## 2018-03-16 VITALS — BP 116/80 | HR 71 | Temp 98.4°F | Ht 68.0 in

## 2018-03-16 DIAGNOSIS — M5431 Sciatica, right side: Secondary | ICD-10-CM | POA: Diagnosis not present

## 2018-03-16 MED ORDER — CYCLOBENZAPRINE HCL 10 MG PO TABS: 10.0000 mg | ORAL_TABLET | Freq: Three times a day (TID) | ORAL | 2 refills | Status: DC | PRN

## 2018-03-16 MED ORDER — TRAMADOL HCL 50 MG PO TABS: 100.0000 mg | ORAL_TABLET | Freq: Four times a day (QID) | ORAL | 0 refills | Status: DC | PRN

## 2018-03-16 NOTE — Progress Notes (Signed)
   Subjective:    Patient ID: Richard Sellers, male    DOB: 07-Jan-1956, 62 y.o.   MRN: [id number]  HPI Here for a flare up of sciatica pain in the right leg. He denies back pain but the pain starts in the right buttock and radiates into the right thigh. No numbness or weakness. Ibuprofen does not help.    Review of Systems  Constitutional: Negative.   Respiratory: Negative.   Cardiovascular: Negative.   Musculoskeletal: Positive for back pain.  Neurological: Negative.        Objective:   Physical Exam  Constitutional: He appears well-developed and well-nourished.  Cardiovascular: Normal rate, regular rhythm, normal heart sounds and intact distal pulses.  Pulmonary/Chest: Effort normal and breath sounds normal.  Musculoskeletal:  The lower back is normal with no tenderness and full ROM. The right sciatic notch is tender. Negative SLR           Assessment & Plan:  Sciatica. Treat with Tramadol and Flexeril. Written out of work today until 03-19-18. Alysia Penna, MD

## 2018-03-16 NOTE — Telephone Encounter (Signed)
Last OV 01/11/2018  Last refilled 10/02/2017 10 Ml with 5 refills   Sent to PCP for approval

## 2018-03-16 NOTE — Telephone Encounter (Signed)
Refill for 6 months. 

## 2018-03-18 ENCOUNTER — Encounter: Payer: Self-pay | Admitting: Family Medicine

## 2018-03-19 NOTE — Telephone Encounter (Signed)
We tried to increase the Celexa 40 mg from one a day to BID. Instead have him stop the Celexa and switch to Cymbalta 60 mg to take one a day. Call in #30 with 2 rf

## 2018-03-19 NOTE — Telephone Encounter (Signed)
Sent to PCP to advise what we should do next since PA was not approved for Celexa

## 2018-03-19 NOTE — Telephone Encounter (Signed)
Fax received from [place] stating the request was not approved and this was given to Dr Barbie Banner asst.

## 2018-03-19 NOTE — Telephone Encounter (Signed)
The PA denial has been placed in GREEN folder to review if needed.

## 2018-03-22 ENCOUNTER — Other Ambulatory Visit: Payer: Self-pay

## 2018-03-22 MED ORDER — DULOXETINE HCL 60 MG PO CPEP: 60.0000 mg | ORAL_CAPSULE | Freq: Every day | ORAL | 2 refills | Status: DC

## 2018-03-22 NOTE — Telephone Encounter (Signed)
Cymbalta 60 mg to take one a day. Call in #30 with 2 rf

## 2018-03-22 NOTE — Telephone Encounter (Signed)
Called and spoke with pt. Pt advised and voiced understanding. Rx has been sent into pt's pharmacy and celexa has been removed from pt's medication list.

## 2018-04-26 ENCOUNTER — Ambulatory Visit: Payer: Managed Care, Other (non HMO) | Admitting: Family Medicine

## 2018-04-26 ENCOUNTER — Encounter: Payer: Self-pay | Admitting: Family Medicine

## 2018-04-26 VITALS — BP 158/98 | HR 105 | Temp 98.6°F | Ht 68.0 in

## 2018-04-26 DIAGNOSIS — M545 Low back pain, unspecified: Secondary | ICD-10-CM

## 2018-04-26 LAB — POCT URINALYSIS DIPSTICK
Bilirubin, UA: NEGATIVE
Blood, UA: NEGATIVE
Glucose, UA: NEGATIVE
Ketones, UA: NEGATIVE
Leukocytes, UA: NEGATIVE
Nitrite, UA: NEGATIVE
Protein, UA: NEGATIVE
Spec Grav, UA: 1.025 (ref 1.010–1.025)
Urobilinogen, UA: 0.2 E.U./dL
pH, UA: 6 (ref 5.0–8.0)

## 2018-04-26 MED ORDER — HYDROCODONE-ACETAMINOPHEN 10-325 MG PO TABS: 1.0000 | ORAL_TABLET | Freq: Four times a day (QID) | ORAL | 0 refills | Status: DC | PRN

## 2018-04-26 MED ORDER — TAMSULOSIN HCL 0.4 MG PO CAPS: 0.4000 mg | ORAL_CAPSULE | Freq: Every day | ORAL | 3 refills | Status: DC

## 2018-04-26 NOTE — Progress Notes (Signed)
   Subjective:    Patient ID: Richard Sellers, male    DOB: [birth year], 62 y.o.   MRN: [id number]  HPI Here for what he thinks is a kidney stone. He had the exact same pain in February and wound up having lithotripsy for a 9 mm left ureteral stone. He first had this pain 6 days ago and then he had it again last night. This is a sharp pain in the left lower back that radiates around the left flank down to the left groin. No blood in the urine. No urgency or burning.    Review of Systems  Constitutional: Negative.   Respiratory: Negative.   Cardiovascular: Negative.   Gastrointestinal: Negative.   Genitourinary: Negative.   Musculoskeletal: Positive for back pain.       Objective:   Physical Exam  Constitutional: He is oriented to person, place, and time. He appears well-developed and well-nourished.  Cardiovascular: Normal rate, regular rhythm, normal heart sounds and intact distal pulses.  Pulmonary/Chest: Effort normal and breath sounds normal.  Abdominal: Soft. Bowel sounds are normal. He exhibits no distension and no mass. There is no tenderness. There is no rebound and no guarding. No hernia.  No CVAT   Neurological: He is alert and oriented to person, place, and time.          Assessment & Plan:  Possible ureteral stone. He asked me not to order a CT scan because it is too expensive. He will drink lot os water and start on Flomax 0.4 mg daily. Use Norco for pain. Strain the urine. Recheck prn.  Alysia Penna, MD

## 2018-05-12 ENCOUNTER — Other Ambulatory Visit: Payer: Self-pay | Admitting: Family Medicine

## 2018-05-12 NOTE — Telephone Encounter (Signed)
Last OV 04/26/2018   Last refilled 11/11/2017 disp 60 with 5 refills   Sent to PCP to advise

## 2018-05-12 NOTE — Telephone Encounter (Signed)
Call in #60 with 5 rf 

## 2018-06-09 ENCOUNTER — Other Ambulatory Visit: Payer: Self-pay | Admitting: Family Medicine

## 2018-06-09 NOTE — Telephone Encounter (Signed)
Dr. Fry please advise of refill. Thanks 

## 2018-06-11 NOTE — Telephone Encounter (Signed)
Call in #120 with 5 rf 

## 2018-09-01 ENCOUNTER — Encounter: Payer: Self-pay | Admitting: Family Medicine

## 2018-09-01 ENCOUNTER — Ambulatory Visit: Payer: Managed Care, Other (non HMO) | Admitting: Family Medicine

## 2018-09-01 VITALS — BP 160/94 | HR 77 | Temp 98.3°F

## 2018-09-01 DIAGNOSIS — J019 Acute sinusitis, unspecified: Secondary | ICD-10-CM | POA: Diagnosis not present

## 2018-09-01 DIAGNOSIS — E291 Testicular hypofunction: Secondary | ICD-10-CM

## 2018-09-01 MED ORDER — AMOXICILLIN-POT CLAVULANATE 875-125 MG PO TABS: 1.0000 | ORAL_TABLET | Freq: Two times a day (BID) | ORAL | 0 refills | Status: DC

## 2018-09-01 MED ORDER — HYDROCODONE-HOMATROPINE 5-1.5 MG/5ML PO SYRP: 5.0000 mL | ORAL_SOLUTION | ORAL | 0 refills | Status: DC | PRN

## 2018-09-01 MED ORDER — METHYLPREDNISOLONE ACETATE 80 MG/ML IJ SUSP
120.0000 mg | Freq: Once | INTRAMUSCULAR | Status: AC
  Administered 2018-09-01: 120 mg via INTRAMUSCULAR

## 2018-09-01 NOTE — Progress Notes (Signed)
   Subjective:    Patient ID: Richard Sellers, male    DOB: 1956-07-06, 62 y.o.   MRN: [id number]  HPI Here for 10 days of sinus pressure, PND, and a dry cough.    Review of Systems  Constitutional: Negative.   HENT: Positive for congestion, postnasal drip, sinus pressure, sinus pain and sore throat.   Eyes: Negative.   Respiratory: Positive for cough.        Objective:   Physical Exam  Constitutional: He appears well-developed and well-nourished.  HENT:  Right Ear: External ear normal.  Left Ear: External ear normal.  Nose: Nose normal.  Mouth/Throat: Oropharynx is clear and moist.  Eyes: Conjunctivae are normal.  Neck: No thyromegaly present.  Pulmonary/Chest: Effort normal and breath sounds normal.  Lymphadenopathy:    He has no cervical adenopathy.          Assessment & Plan:  Sinusitis, treat with Augmentin and a steroid shot. Written out of work today.  Alysia Penna, MD

## 2018-09-02 LAB — TESTOSTERONE: Testosterone: 303.28 ng/dL (ref 300.00–890.00)

## 2018-09-06 ENCOUNTER — Telehealth: Payer: Self-pay | Admitting: *Deleted

## 2018-09-06 ENCOUNTER — Encounter: Payer: Self-pay | Admitting: Family Medicine

## 2018-09-06 ENCOUNTER — Encounter: Payer: Self-pay | Admitting: *Deleted

## 2018-09-06 NOTE — Telephone Encounter (Signed)
Dr. Fry please advise. Thanks  

## 2018-09-06 NOTE — Telephone Encounter (Signed)
Copied from [place] [id number]. Topic: General - Inquiry >> Sep 06, 2018  9:51 AM Richard Sellers wrote: Reason for CRM: Patient states that he seen Dr Sarajane Jews on 12/11 and he gave him a note to be out of work. He said that he would like that note extended until tomorrow because he felt awful all weekend and return to work on Wednesday, December 18th. Please contact patient when ready for pick up [phone]

## 2018-09-06 NOTE — Telephone Encounter (Signed)
Dr. Sarajane Jews please advise on extension of work note.  Thanks

## 2018-09-07 ENCOUNTER — Encounter: Payer: Self-pay | Admitting: *Deleted

## 2018-09-07 NOTE — Telephone Encounter (Signed)
Work note extension has been completed and given to the pt.

## 2018-09-08 MED ORDER — TESTOSTERONE CYPIONATE 200 MG/ML IM SOLN: INTRAMUSCULAR | 5 refills | Status: DC

## 2018-09-08 NOTE — Addendum Note (Signed)
Addended by: Elie Confer on: 09/08/2018 04:34 PM   Modules accepted: Orders

## 2018-09-08 NOTE — Telephone Encounter (Signed)
Yes let's increase the testosterone cypionate to 1.5 ml every 14 days. Call in a 6 month supply and recheck a level in 6 months

## 2018-10-05 ENCOUNTER — Ambulatory Visit: Payer: Managed Care, Other (non HMO) | Admitting: Family Medicine

## 2018-10-05 ENCOUNTER — Encounter: Payer: Self-pay | Admitting: Family Medicine

## 2018-10-05 VITALS — BP 162/106 | HR 73 | Temp 98.7°F

## 2018-10-05 DIAGNOSIS — J0191 Acute recurrent sinusitis, unspecified: Secondary | ICD-10-CM | POA: Diagnosis not present

## 2018-10-05 MED ORDER — METHYLPREDNISOLONE ACETATE 80 MG/ML IJ SUSP
120.0000 mg | Freq: Once | INTRAMUSCULAR | Status: AC
  Administered 2018-10-05: 120 mg via INTRAMUSCULAR

## 2018-10-05 MED ORDER — DOXYCYCLINE HYCLATE 100 MG PO CAPS: 100.0000 mg | ORAL_CAPSULE | Freq: Two times a day (BID) | ORAL | 0 refills | Status: AC

## 2018-10-05 MED ORDER — HYDROCODONE-HOMATROPINE 5-1.5 MG/5ML PO SYRP: 5.0000 mL | ORAL_SOLUTION | ORAL | 0 refills | Status: DC | PRN

## 2018-10-05 NOTE — Progress Notes (Signed)
   Subjective:    Patient ID: Richard Sellers, male    DOB: 05-01-1956, 63 y.o.   MRN: [id number]  HPI Here for recurrent sinus congestion, PND, and coughing up yellow sputum. No fever. We saw him last month and treated him with Augmentin. His symptoms improved for awhile but then returned.   Review of Systems  Constitutional: Negative.   HENT: Positive for congestion, postnasal drip and sinus pressure. Negative for sore throat.   Eyes: Negative.   Respiratory: Positive for cough.        Objective:   Physical Exam Constitutional:      Appearance: Normal appearance.  HENT:     Right Ear: Tympanic membrane and ear canal normal.     Left Ear: Tympanic membrane and ear canal normal.     Nose: Nose normal.     Mouth/Throat:     Pharynx: Oropharynx is clear.  Eyes:     Conjunctiva/sclera: Conjunctivae normal.  Pulmonary:     Effort: Pulmonary effort is normal. No respiratory distress.     Breath sounds: Normal breath sounds. No stridor. No wheezing, rhonchi or rales.  Lymphadenopathy:     Cervical: No cervical adenopathy.  Neurological:     Mental Status: He is alert.           Assessment & Plan:  Sinusitis, given Doxycycline for 10 days as well as a steroid shot. Written out of work today.  Alysia Penna, MD

## 2018-10-05 NOTE — Addendum Note (Signed)
Addended by: Elie Confer on: 10/05/2018 03:34 PM   Modules accepted: Orders

## 2018-11-21 ENCOUNTER — Other Ambulatory Visit: Payer: Self-pay | Admitting: Family Medicine

## 2018-11-24 ENCOUNTER — Ambulatory Visit: Payer: Managed Care, Other (non HMO) | Admitting: Family Medicine

## 2018-11-24 ENCOUNTER — Encounter: Payer: Self-pay | Admitting: Family Medicine

## 2018-11-24 VITALS — BP 128/88 | HR 68 | Temp 98.8°F

## 2018-11-24 DIAGNOSIS — J0191 Acute recurrent sinusitis, unspecified: Secondary | ICD-10-CM

## 2018-11-24 MED ORDER — LEVOFLOXACIN 500 MG PO TABS: 500.0000 mg | ORAL_TABLET | Freq: Every day | ORAL | 0 refills | Status: AC

## 2018-11-24 MED ORDER — TEMAZEPAM 15 MG PO CAPS: 30.0000 mg | ORAL_CAPSULE | Freq: Every day | ORAL | 5 refills | Status: DC

## 2018-11-24 MED ORDER — HYDROCODONE-HOMATROPINE 5-1.5 MG/5ML PO SYRP: 5.0000 mL | ORAL_SOLUTION | ORAL | 0 refills | Status: DC | PRN

## 2018-11-24 MED ORDER — FLUTICASONE PROPIONATE 50 MCG/ACT NA SUSP: NASAL | 11 refills | Status: DC

## 2018-11-24 NOTE — Progress Notes (Signed)
   Subjective:    Patient ID: Richard Sellers, male    DOB: 12/25/1955, 63 y.o.   MRN: [id number]  HPI Here for 2 days of sinus congestion, right ear pain, PND, ST, and a dry cough. No fever. He was treated here for sinus infections with Augmentin in December and with Doxycycline in January.    Review of Systems  Constitutional: Negative.   HENT: Positive for congestion, ear pain, postnasal drip, sinus pressure and sore throat. Negative for sinus pain.   Eyes: Negative.   Respiratory: Positive for cough.        Objective:   Physical Exam Constitutional:      Appearance: Normal appearance.  HENT:     Right Ear: Tympanic membrane and ear canal normal.     Left Ear: Tympanic membrane and ear canal normal.     Nose: Nose normal.     Mouth/Throat:     Pharynx: Oropharynx is clear.  Eyes:     Conjunctiva/sclera: Conjunctivae normal.  Pulmonary:     Effort: Pulmonary effort is normal.     Breath sounds: Normal breath sounds.  Lymphadenopathy:     Cervical: No cervical adenopathy.  Neurological:     Mental Status: He is alert.           Assessment & Plan:  Recurrent sinusitis, treat with Levaquin. Written out of work from 11-23-18 until 11-26-18.  Alysia Penna, MD

## 2018-12-24 ENCOUNTER — Other Ambulatory Visit: Payer: Self-pay | Admitting: Family Medicine

## 2018-12-27 NOTE — Telephone Encounter (Signed)
Call in #120 with 5 rf 

## 2018-12-29 NOTE — Telephone Encounter (Signed)
Refill called to the pharmacy and left on the VM 

## 2019-02-11 ENCOUNTER — Other Ambulatory Visit: Payer: Self-pay

## 2019-02-11 ENCOUNTER — Telehealth: Payer: Self-pay | Admitting: *Deleted

## 2019-02-11 ENCOUNTER — Encounter: Payer: Self-pay | Admitting: Family Medicine

## 2019-02-11 ENCOUNTER — Ambulatory Visit (INDEPENDENT_AMBULATORY_CARE_PROVIDER_SITE_OTHER): Payer: Managed Care, Other (non HMO) | Admitting: Family Medicine

## 2019-02-11 DIAGNOSIS — R05 Cough: Secondary | ICD-10-CM | POA: Diagnosis not present

## 2019-02-11 DIAGNOSIS — J0101 Acute recurrent maxillary sinusitis: Secondary | ICD-10-CM

## 2019-02-11 DIAGNOSIS — T7840XD Allergy, unspecified, subsequent encounter: Secondary | ICD-10-CM

## 2019-02-11 DIAGNOSIS — R059 Cough, unspecified: Secondary | ICD-10-CM

## 2019-02-11 DIAGNOSIS — H6993 Unspecified Eustachian tube disorder, bilateral: Secondary | ICD-10-CM

## 2019-02-11 MED ORDER — DOXYCYCLINE HYCLATE 100 MG PO TABS: 100.0000 mg | ORAL_TABLET | Freq: Two times a day (BID) | ORAL | 0 refills | Status: AC

## 2019-02-11 MED ORDER — FLUTICASONE PROPIONATE 50 MCG/ACT NA SUSP: NASAL | 11 refills | Status: DC

## 2019-02-11 MED ORDER — PREDNISONE 20 MG PO TABS: 40.0000 mg | ORAL_TABLET | Freq: Every day | ORAL | 0 refills | Status: AC

## 2019-02-11 MED ORDER — HYDROCODONE-HOMATROPINE 5-1.5 MG/5ML PO SYRP: 5.0000 mL | ORAL_SOLUTION | ORAL | 0 refills | Status: DC | PRN

## 2019-02-11 NOTE — Telephone Encounter (Signed)
Called pt and got him scheduled with Dr. Ethlyn Gallery for a virtual appointment at 11:30

## 2019-02-11 NOTE — Telephone Encounter (Signed)
Copied from [place] [id number]. Topic: General - Other >> Feb 11, 2019  7:19 AM Carolyn Stare wrote:  Pt req appt for sinus infection

## 2019-02-11 NOTE — Progress Notes (Signed)
Virtual Visit via Video Note  I connected with Richard Sellers on 02/11/19 at 11:30 AM EDT by a video enabled telemedicine application and verified that I am speaking with the correct person using two identifiers.  Location patient: home Location provider:work or home office Persons participating in the virtual visit: patient, provider  I discussed the limitations of evaluation and management by telemedicine and the availability of in person appointments. The patient expressed understanding and agreed to proceed.   Richard Sellers DOB: [birth year] Encounter date: 02/11/2019  This is a 63 y.o. male who presents with Chief Complaint  Patient presents with  . Cough    productive with clear sputum x5 days, denies shortness of breath nor fever or chills  . Sinus Problem    states he has chronic sinus infections, worse x5 days  . Nasal Congestion    History of present illness: Has been having gradual worsening of sx for a month; been getting worse and just thought it was weather. When sx get bad usually need abx to help clear up sinus infection; has developed bronchitis.   Has been getting temp checked for work, but hasn't gone to work in last couple of days. Work was concerned with him going back to work.   Nose dripping excessively in morning; coughing 20 minute episodes. Taking tylenol TID with OTC sinus treatment as well which help a little, but not helping much. Cough seems to be getting deeper. No problems with taking deep breath.   Slight wheezing; noted just starting today. Not on regular inhalers.   Usually gets depo shot in office and does respond well to antibiotics for sinus infections in past.   Active Ambulatory Problems    Diagnosis Date Noted  . Anxiety state 05/05/2008  . Depression, major, single episode, moderate ([place]) 06/02/2007  . Essential hypertension 09/25/2008  . GERD 05/30/2010  . PSORIASIS 05/30/2010  . Hypothyroidism 11/26/2016  . Bilateral leg edema  02/17/2017  . Hypogonadism in male 09/01/2018   Resolved Ambulatory Problems    Diagnosis Date Noted  . No Resolved Ambulatory Problems   Past Medical History:  Diagnosis Date  . Allergy   . Anxiety   . Depression   . ED (erectile dysfunction)   . Heart murmur   . History of kidney stones   . Hypertension   . PONV (postoperative nausea and vomiting)   . Warts, genital       No Known Allergies Current Meds  Medication Sig  . ALPRAZolam (XANAX) 1 MG tablet TAKE ONE TABLET BY MOUTH EVERY 6 HOURS AS NEEDED FOR ANXIETY  . cyclobenzaprine (FLEXERIL) 10 MG tablet Take 1 tablet (10 mg total) by mouth 3 (three) times daily as needed for muscle spasms.  . diclofenac (VOLTAREN) 50 MG EC tablet Take 1 tablet (50 mg total) by mouth 3 (three) times daily as needed for moderate pain.  . fluticasone (FLONASE) 50 MCG/ACT nasal spray SPRAY TWO SPRAYS IN EACH NOSTRIL ONCE DAILY  . halobetasol (ULTRAVATE) 0.05 % cream APPLY TOPICALLY TWO TIMES A DAY (Patient taking differently: daily as needed. APPLY TOPICALLY TWO TIMES A DAY)  . HYDROcodone-acetaminophen (NORCO) 10-325 MG tablet Take 1 tablet by mouth every 6 (six) hours as needed for moderate pain.  Marland Kitchen levothyroxine (SYNTHROID, LEVOTHROID) 75 MCG tablet Take 1 tablet (75 mcg total) by mouth daily.  . ondansetron (ZOFRAN) 4 MG tablet Take 1 tablet (4 mg total) by mouth every 8 (eight) hours as needed for nausea or vomiting.  Marland Kitchen  sildenafil (REVATIO) 20 MG tablet TAKE 1 TABLET (20 MG TOTAL) BY MOUTH AS NEEDED. (Patient taking differently: TAKE 1 TABLET (20 MG TOTAL) BY MOUTH AS NEEDED FOR ED)  . SYRINGE-NEEDLE, DISP, 3 ML (SAFETY SYRINGES/NEEDLE) 20G X 1-1/2" 3 ML MISC 1 application by Does not apply route every 14 (fourteen) days.  . tamsulosin (FLOMAX) 0.4 MG CAPS capsule Take 1 capsule (0.4 mg total) by mouth daily.  . temazepam (RESTORIL) 15 MG capsule TAKE TWO CAPSULES BY MOUTH AT BEDTIME WHEN NEEDED  . temazepam (RESTORIL) 15 MG capsule Take 2  capsules (30 mg total) by mouth at bedtime.  Marland Kitchen testosterone cypionate (DEPOTESTOSTERONE CYPIONATE) 200 MG/ML injection Inject 1.5 ml Intramuscularly every 14 days.  . traMADol (ULTRAM) 50 MG tablet Take 2 tablets (100 mg total) by mouth every 6 (six) hours as needed for moderate pain.  . [DISCONTINUED] citalopram (CELEXA) 40 MG tablet Take 40 mg by mouth 2 (two) times daily.  . [DISCONTINUED] fluticasone (FLONASE) 50 MCG/ACT nasal spray SPRAY TWO SPRAYS IN EACH NOSTRIL ONCE DAILY  . [DISCONTINUED] HYDROcodone-homatropine (HYDROMET) 5-1.5 MG/5ML syrup Take 5 mLs by mouth every 4 (four) hours as needed.    Review of Systems  Constitutional: Negative for chills and fever.  HENT: Positive for congestion, ear pain (feels like there is fluid behind ears), postnasal drip, sinus pressure, sinus pain and sore throat.   Respiratory: Positive for cough and wheezing (slight). Negative for shortness of breath.   Cardiovascular: Negative for chest pain.  Neurological: Positive for headaches.    Objective:  There were no vitals taken for this visit.      BP Readings from Last 3 Encounters:  11/24/18 128/88  10/05/18 (!) 162/106  09/01/18 (!) 160/94   Wt Readings from Last 3 Encounters:  10/28/17 168 lb (76.2 kg)  10/26/17 168 lb (76.2 kg)  09/28/17 162 lb 12.8 oz (73.8 kg)    EXAM:  GENERAL: alert, oriented, appears well and in no acute distress  HEENT: atraumatic, conjunctiva clear, no obvious abnormalities on inspection of external nose and ears  NECK: normal movements of the head and neck  LUNGS: on inspection no signs of respiratory distress, breathing rate appears normal, no obvious gross SOB, gasping or wheezing  CV: no obvious cyanosis  MS: moves all visible extremities without noticeable abnormality  PSYCH/NEURO: pleasant and cooperative, no obvious depression or anxiety, speech and thought processing grossly intact   Assessment/Plan  1. Acute recurrent maxillary  sinusitis Typically does well with abx; will treat due to worsening sx and duration. Let us know if not resolved after tx or if worsening on abx. - doxycycline (VIBRA-TABS) 100 MG tablet; Take 1 tablet (100 mg total) by mouth 2 (two) times daily for 7 days.  Dispense: 14 tablet; Refill: 0  2. Cough Only prednisone if needed (we are heading into long weekend so this is more of back up). Suspect cough will improve with sinus treatment.  - predniSONE (DELTASONE) 20 MG tablet; Take 2 tablets (40 mg total) by mouth daily with breakfast for 5 days.  Dispense: 10 tablet; Refill: 0 - HYDROcodone-homatropine (HYDROMET) 5-1.5 MG/5ML syrup; Take 5 mLs by mouth every 4 (four) hours as needed.  Dispense: 240 mL; Refill: 0  3. Allergic state, subsequent encounter - fluticasone (FLONASE) 50 MCG/ACT nasal spray; SPRAY TWO SPRAYS IN EACH NOSTRIL ONCE DAILY  Dispense: 16 g; Refill: 11  4. Eustachian tube disorder, bilateral Prednisone only if needed. Restart flonase; keep taking antihistamine.  - predniSONE (DELTASONE) 20  MG tablet; Take 2 tablets (40 mg total) by mouth daily with breakfast for 5 days.  Dispense: 10 tablet; Refill: 0    Return if symptoms worsen or fail to improve.   I discussed the assessment and treatment plan with the patient. The patient was provided an opportunity to ask questions and all were answered. The patient agreed with the plan and demonstrated an understanding of the instructions.   The patient was advised to call back or seek an in-person evaluation if the symptoms worsen or if the condition fails to improve as anticipated.  I provided 20 minutes of non-face-to-face time during this encounter.   Micheline Rough, MD

## 2019-02-11 NOTE — Telephone Encounter (Signed)
Copied from [place] [phone]. Topic: General - Other >> Feb 11, 2019 12:07 PM Nils Flack, Marland Kitchen wrote: Reason for CRM:pt needs a return to work note printed today so he can give to employer in time.  Please leave at front desk.   Cb is [phone]  Clinic RN contacted patient and patient informed RN he already got a work note.

## 2019-03-28 ENCOUNTER — Other Ambulatory Visit: Payer: Self-pay | Admitting: Family Medicine

## 2019-04-13 NOTE — Telephone Encounter (Signed)
Last filled 09/08/18 Last OV 11/24/2018  Ok to fill?

## 2019-04-19 NOTE — Telephone Encounter (Signed)
Pt calling to check status. Please advise   [phone]

## 2019-04-20 NOTE — Telephone Encounter (Signed)
Sent in electronically . X 1 further refills per dr Sarajane Jews PCP

## 2019-04-25 ENCOUNTER — Other Ambulatory Visit: Payer: Self-pay

## 2019-04-25 ENCOUNTER — Encounter: Payer: Self-pay | Admitting: Family Medicine

## 2019-04-25 ENCOUNTER — Ambulatory Visit (INDEPENDENT_AMBULATORY_CARE_PROVIDER_SITE_OTHER): Payer: Managed Care, Other (non HMO) | Admitting: Family Medicine

## 2019-04-25 DIAGNOSIS — R059 Cough, unspecified: Secondary | ICD-10-CM

## 2019-04-25 DIAGNOSIS — J0191 Acute recurrent sinusitis, unspecified: Secondary | ICD-10-CM

## 2019-04-25 DIAGNOSIS — R05 Cough: Secondary | ICD-10-CM | POA: Diagnosis not present

## 2019-04-25 MED ORDER — LEVOFLOXACIN 500 MG PO TABS: 500.0000 mg | ORAL_TABLET | Freq: Every day | ORAL | 0 refills | Status: AC

## 2019-04-25 MED ORDER — HYDROCODONE-HOMATROPINE 5-1.5 MG/5ML PO SYRP: 5.0000 mL | ORAL_SOLUTION | ORAL | 0 refills | Status: DC | PRN

## 2019-04-25 NOTE — Progress Notes (Signed)
   Subjective:    Patient ID: Richard Sellers, male    DOB: 12-20-1955, 63 y.o.   MRN: [id number]  HPI Virtual Visit via Telephone Note  I connected with the patient on 04/25/19 at 10:30 AM EDT by telephone and verified that I am speaking with the correct person using two identifiers. We attempted to connect virtually but we had technical difficulties with the audio and video.     I discussed the limitations, risks, security and privacy concerns of performing an evaluation and management service by telephone and the availability of in person appointments. I also discussed with the patient that there may be a patient responsible charge related to this service. The patient expressed understanding and agreed to proceed.  Location patient: home Location provider: work or home office Participants present for the call: patient, provider Patient did not have a visit in the prior 7 days to address this/these issue(s).   History of Present Illness: Here for a week of sinus congestion, blowing green mucus from the nose, decreased hearing in the left ear, and a dry cough. No fever or SOB or NVD or body aches. He is using Flonase and Tylenol.    Observations/Objective: Patient sounds cheerful and well on the phone. I do not appreciate any SOB. Speech and thought processing are grossly intact. Patient reported vitals:  Assessment and Plan: Recurrent sinusitis, treat with Levaquin. He is written out of work yesterday and today.  Alysia Penna, MD   Follow Up Instructions:     [phone] 5-10 [phone] 11-20 [id number] 21-30 I did not refer this patient for an OV in the next 24 hours for this/these issue(s).  I discussed the assessment and treatment plan with the patient. The patient was provided an opportunity to ask questions and all were answered. The patient agreed with the plan and demonstrated an understanding of the instructions.   The patient was advised to call back or seek an in-person  evaluation if the symptoms worsen or if the condition fails to improve as anticipated.  I provided 12 minutes of non-face-to-face time during this encounter.   Alysia Penna, MD    Review of Systems     Objective:   Physical Exam        Assessment & Plan:

## 2019-04-27 ENCOUNTER — Other Ambulatory Visit: Payer: Self-pay | Admitting: Family Medicine

## 2019-05-31 ENCOUNTER — Other Ambulatory Visit: Payer: Self-pay | Admitting: Family Medicine

## 2019-05-31 NOTE — Telephone Encounter (Signed)
Last filled 11/25/2018 Last OV 04/25/2019  Ok to fill?

## 2019-06-20 ENCOUNTER — Telehealth (INDEPENDENT_AMBULATORY_CARE_PROVIDER_SITE_OTHER): Payer: Managed Care, Other (non HMO) | Admitting: Family Medicine

## 2019-06-20 ENCOUNTER — Other Ambulatory Visit: Payer: Self-pay

## 2019-06-20 ENCOUNTER — Encounter: Payer: Self-pay | Admitting: Family Medicine

## 2019-06-20 DIAGNOSIS — J0191 Acute recurrent sinusitis, unspecified: Secondary | ICD-10-CM | POA: Diagnosis not present

## 2019-06-20 DIAGNOSIS — R05 Cough: Secondary | ICD-10-CM | POA: Diagnosis not present

## 2019-06-20 DIAGNOSIS — R059 Cough, unspecified: Secondary | ICD-10-CM

## 2019-06-20 MED ORDER — AMOXICILLIN-POT CLAVULANATE 875-125 MG PO TABS: 1.0000 | ORAL_TABLET | Freq: Two times a day (BID) | ORAL | 0 refills | Status: DC

## 2019-06-20 MED ORDER — HYDROCODONE-HOMATROPINE 5-1.5 MG/5ML PO SYRP: 5.0000 mL | ORAL_SOLUTION | ORAL | 0 refills | Status: DC | PRN

## 2019-06-20 NOTE — Patient Instructions (Signed)
Health Maintenance Due  Topic Date Due  . Hepatitis C Screening  1956-04-01  . COLONOSCOPY  11/07/2017  . INFLUENZA VACCINE  04/23/2019    No flowsheet data found.

## 2019-06-20 NOTE — Progress Notes (Signed)
Virtual Visit via Video Note  I connected with the patient on 06/20/19 at  1:00 PM EDT by a video enabled telemedicine application and verified that I am speaking with the correct person using two identifiers.  Location patient: home Location provider:work or home office Persons participating in the virtual visit: patient, provider  I discussed the limitations of evaluation and management by telemedicine and the availability of in person appointments. The patient expressed understanding and agreed to proceed.   HPI: Here for continued symptoms of stuffy head, PND, chest congestion and a dry cough. No fever or SOB or chest pain or NVD. We had a VV with him on 04-25-19 and gave him Levaquin. This seemed to help a bit, but the symptoms never went away. Using an antihistamine and Flonase.    ROS: See pertinent positives and negatives per HPI.  Past Medical History:  Diagnosis Date  . Allergy   . Anxiety   . Depression   . ED (erectile dysfunction)   . Heart murmur    at age 63 years old, heard x1 , never again  . History of kidney stones   . Hypertension   . Hypothyroidism   . PONV (postoperative nausea and vomiting)   . Warts, genital     Past Surgical History:  Procedure Laterality Date  . APPENDECTOMY    . COLONOSCOPY  11/08/2007   per Dr. Sharlett Iles, diverticulosis but no polyps, repeat in 10 yrs   . CYSTECTOMY    . EXTRACORPOREAL SHOCK WAVE LITHOTRIPSY Left 10/29/2017   Procedure: LEFT EXTRACORPOREAL SHOCK WAVE LITHOTRIPSY (ESWL);  Surgeon: Franchot Gallo, MD;  Location: [facility];  Service: Urology;  Laterality: Left;  . HERNIA REPAIR  [id number]   umbilical hernia  . KNEE ARTHROSCOPY     right knee  . TONSILLECTOMY      Family History  Problem Relation Age of Onset  . Hypertension Unknown        family hx  . Hyperlipidemia Unknown        family hx     Current Outpatient Medications:  .  ALPRAZolam (XANAX) 1 MG tablet, TAKE ONE TABLET BY MOUTH EVERY 6 HOURS AS  NEEDED FOR ANXIETY, Disp: 120 tablet, Rfl: 5 .  cyclobenzaprine (FLEXERIL) 10 MG tablet, Take 1 tablet (10 mg total) by mouth 3 (three) times daily as needed for muscle spasms., Disp: 60 tablet, Rfl: 2 .  diclofenac (VOLTAREN) 50 MG EC tablet, Take 1 tablet (50 mg total) by mouth 3 (three) times daily as needed for moderate pain., Disp: 90 tablet, Rfl: 5 .  fluticasone (FLONASE) 50 MCG/ACT nasal spray, SPRAY TWO SPRAYS IN EACH NOSTRIL ONCE DAILY, Disp: 16 g, Rfl: 11 .  halobetasol (ULTRAVATE) 0.05 % cream, APPLY TOPICALLY TWO TIMES A DAY (Patient taking differently: daily as needed. APPLY TOPICALLY TWO TIMES A DAY), Disp: 50 g, Rfl: 4 .  HYDROcodone-acetaminophen (NORCO) 10-325 MG tablet, Take 1 tablet by mouth every 6 (six) hours as needed for moderate pain., Disp: 20 tablet, Rfl: 0 .  HYDROcodone-homatropine (HYDROMET) 5-1.5 MG/5ML syrup, Take 5 mLs by mouth every 4 (four) hours as needed., Disp: 240 mL, Rfl: 0 .  levothyroxine (SYNTHROID) 75 MCG tablet, TAKE ONE TABLET BY MOUTH DAILY, Disp: 90 tablet, Rfl: 2 .  ondansetron (ZOFRAN) 4 MG tablet, Take 1 tablet (4 mg total) by mouth every 8 (eight) hours as needed for nausea or vomiting., Disp: 60 tablet, Rfl: 5 .  sildenafil (REVATIO) 20 MG tablet, TAKE 1 TABLET (20  MG TOTAL) BY MOUTH AS NEEDED. (Patient taking differently: TAKE 1 TABLET (20 MG TOTAL) BY MOUTH AS NEEDED FOR ED), Disp: 30 tablet, Rfl: 11 .  SYRINGE-NEEDLE, DISP, 3 ML (SAFETY SYRINGES/NEEDLE) 20G X 1-1/2" 3 ML MISC, 1 application by Does not apply route every 14 (fourteen) days., Disp: 50 each, Rfl: 2 .  tamsulosin (FLOMAX) 0.4 MG CAPS capsule, Take 1 capsule (0.4 mg total) by mouth daily., Disp: 30 capsule, Rfl: 3 .  temazepam (RESTORIL) 15 MG capsule, TAKE TWO CAPSULES BY MOUTH EVERY NIGHT AT BEDTIME, Disp: 60 capsule, Rfl: 5 .  testosterone cypionate (DEPOTESTOSTERONE CYPIONATE) 200 MG/ML injection, INJECT 1.5 ML INTRAMUSCULARLY EVERY 14 DAYS, Disp: 10 mL, Rfl: 0 .  traMADol (ULTRAM)  50 MG tablet, Take 2 tablets (100 mg total) by mouth every 6 (six) hours as needed for moderate pain., Disp: 60 tablet, Rfl: 0 .  amoxicillin-clavulanate (AUGMENTIN) 875-125 MG tablet, Take 1 tablet by mouth 2 (two) times daily., Disp: 20 tablet, Rfl: 0  EXAM:  VITALS per patient if applicable:  GENERAL: alert, oriented, appears well and in no acute distress  HEENT: atraumatic, conjunttiva clear, no obvious abnormalities on inspection of external nose and ears  NECK: normal movements of the head and neck  LUNGS: on inspection no signs of respiratory distress, breathing rate appears normal, no obvious gross SOB, gasping or wheezing  CV: no obvious cyanosis  MS: moves all visible extremities without noticeable abnormality  PSYCH/NEURO: pleasant and cooperative, no obvious depression or anxiety, speech and thought processing grossly intact  ASSESSMENT AND PLAN: Sinusitis, treat with Augmentin.  Alysia Penna, MD  Discussed the following assessment and plan:  Cough - Plan: HYDROcodone-homatropine (HYDROMET) 5-1.5 MG/5ML syrup     I discussed the assessment and treatment plan with the patient. The patient was provided an opportunity to ask questions and all were answered. The patient agreed with the plan and demonstrated an understanding of the instructions.   The patient was advised to call back or seek an in-person evaluation if the symptoms worsen or if the condition fails to improve as anticipated.

## 2019-07-11 ENCOUNTER — Encounter: Payer: Managed Care, Other (non HMO) | Admitting: Family Medicine

## 2019-07-12 ENCOUNTER — Other Ambulatory Visit: Payer: Self-pay | Admitting: Family Medicine

## 2019-07-12 NOTE — Telephone Encounter (Signed)
Last refilled 12/2018 and last OV was 04/2019

## 2019-07-12 NOTE — Telephone Encounter (Signed)
Ok to refill x 1 week in PCP absence for family emergency #30. No refills. Would need appt or need to have refill from PCP for future refills.

## 2019-07-15 ENCOUNTER — Other Ambulatory Visit: Payer: Self-pay | Admitting: Family Medicine

## 2019-07-18 NOTE — Telephone Encounter (Signed)
Okay for refill?  

## 2019-07-29 ENCOUNTER — Telehealth: Payer: Self-pay | Admitting: Family Medicine

## 2019-07-29 ENCOUNTER — Telehealth (INDEPENDENT_AMBULATORY_CARE_PROVIDER_SITE_OTHER): Payer: Managed Care, Other (non HMO) | Admitting: Family Medicine

## 2019-07-29 ENCOUNTER — Other Ambulatory Visit: Payer: Self-pay

## 2019-07-29 ENCOUNTER — Encounter: Payer: Self-pay | Admitting: Family Medicine

## 2019-07-29 DIAGNOSIS — R05 Cough: Secondary | ICD-10-CM | POA: Diagnosis not present

## 2019-07-29 DIAGNOSIS — R6889 Other general symptoms and signs: Secondary | ICD-10-CM

## 2019-07-29 DIAGNOSIS — M2550 Pain in unspecified joint: Secondary | ICD-10-CM

## 2019-07-29 DIAGNOSIS — R110 Nausea: Secondary | ICD-10-CM

## 2019-07-29 DIAGNOSIS — R059 Cough, unspecified: Secondary | ICD-10-CM

## 2019-07-29 DIAGNOSIS — R197 Diarrhea, unspecified: Secondary | ICD-10-CM | POA: Diagnosis not present

## 2019-07-29 DIAGNOSIS — R519 Headache, unspecified: Secondary | ICD-10-CM

## 2019-07-29 DIAGNOSIS — R0602 Shortness of breath: Secondary | ICD-10-CM

## 2019-07-29 DIAGNOSIS — Z20822 Contact with and (suspected) exposure to covid-19: Secondary | ICD-10-CM

## 2019-07-29 MED ORDER — AMOXICILLIN-POT CLAVULANATE 875-125 MG PO TABS: 1.0000 | ORAL_TABLET | Freq: Two times a day (BID) | ORAL | 0 refills | Status: DC

## 2019-07-29 MED ORDER — HYDROCODONE-HOMATROPINE 5-1.5 MG/5ML PO SYRP: 5.0000 mL | ORAL_SOLUTION | ORAL | 0 refills | Status: DC | PRN

## 2019-07-29 NOTE — Telephone Encounter (Signed)
HYDROcodone-homatropine (HYDROMET) 5-1.5 MG/5ML syrup     Patient states that when his son went to pharmacy to pick up RX, patient's son forgot the bag at pharmacy. He states he has contacted the pharmacy and they have advised him that no one has turned in the medication and in order to get it filled again, they will need approval from MD. Please advise.

## 2019-07-29 NOTE — Progress Notes (Signed)
Virtual Visit via Video Note  I connected with the patient on 07/29/19 at  8:45 AM EST by a video enabled telemedicine application and verified that I am speaking with the correct person using two identifiers.  Location patient: home Location provider:work or home office Persons participating in the virtual visit: patient, provider  I discussed the limitations of evaluation and management by telemedicine and the availability of in person appointments. The patient expressed understanding and agreed to proceed.   HPI: Here for 3 days of headache, body aches, a dry cough, SOB, nausea without vomiting, and diarrhea. No fever. He is drinking fluids and taking Tylenol.    ROS: See pertinent positives and negatives per HPI.  Past Medical History:  Diagnosis Date  . Allergy   . Anxiety   . Depression   . ED (erectile dysfunction)   . Heart murmur    at age 48 years old, heard x1 , never again  . History of kidney stones   . Hypertension   . Hypothyroidism   . PONV (postoperative nausea and vomiting)   . Warts, genital     Past Surgical History:  Procedure Laterality Date  . APPENDECTOMY    . COLONOSCOPY  11/08/2007   per Dr. Sharlett Iles, diverticulosis but no polyps, repeat in 10 yrs   . CYSTECTOMY    . EXTRACORPOREAL SHOCK WAVE LITHOTRIPSY Left 10/29/2017   Procedure: LEFT EXTRACORPOREAL SHOCK WAVE LITHOTRIPSY (ESWL);  Surgeon: Franchot Gallo, MD;  Location: [facility];  Service: Urology;  Laterality: Left;  . HERNIA REPAIR  [id number]   umbilical hernia  . KNEE ARTHROSCOPY     right knee  . TONSILLECTOMY      Family History  Problem Relation Age of Onset  . Hypertension Unknown        family hx  . Hyperlipidemia Unknown        family hx     Current Outpatient Medications:  .  ALPRAZolam (XANAX) 1 MG tablet, TAKE ONE TABLET BY MOUTH EVERY 6 HOURS AS NEEDED FOR ANXIETY, Disp: 120 tablet, Rfl: 5 .  amoxicillin-clavulanate (AUGMENTIN) 875-125 MG tablet, Take 1 tablet by mouth  2 (two) times daily., Disp: 20 tablet, Rfl: 0 .  cyclobenzaprine (FLEXERIL) 10 MG tablet, Take 1 tablet (10 mg total) by mouth 3 (three) times daily as needed for muscle spasms., Disp: 60 tablet, Rfl: 2 .  diclofenac (VOLTAREN) 50 MG EC tablet, Take 1 tablet (50 mg total) by mouth 3 (three) times daily as needed for moderate pain., Disp: 90 tablet, Rfl: 5 .  fluticasone (FLONASE) 50 MCG/ACT nasal spray, SPRAY TWO SPRAYS IN EACH NOSTRIL ONCE DAILY, Disp: 16 g, Rfl: 11 .  halobetasol (ULTRAVATE) 0.05 % cream, APPLY TOPICALLY TWO TIMES A DAY (Patient taking differently: daily as needed. APPLY TOPICALLY TWO TIMES A DAY), Disp: 50 g, Rfl: 4 .  HYDROcodone-acetaminophen (NORCO) 10-325 MG tablet, Take 1 tablet by mouth every 6 (six) hours as needed for moderate pain., Disp: 20 tablet, Rfl: 0 .  HYDROcodone-homatropine (HYDROMET) 5-1.5 MG/5ML syrup, Take 5 mLs by mouth every 4 (four) hours as needed., Disp: 240 mL, Rfl: 0 .  levothyroxine (SYNTHROID) 75 MCG tablet, TAKE ONE TABLET BY MOUTH DAILY, Disp: 90 tablet, Rfl: 2 .  ondansetron (ZOFRAN) 4 MG tablet, Take 1 tablet (4 mg total) by mouth every 8 (eight) hours as needed for nausea or vomiting., Disp: 60 tablet, Rfl: 5 .  sildenafil (REVATIO) 20 MG tablet, TAKE 1 TABLET (20 MG TOTAL) BY MOUTH AS  NEEDED. (Patient taking differently: TAKE 1 TABLET (20 MG TOTAL) BY MOUTH AS NEEDED FOR ED), Disp: 30 tablet, Rfl: 11 .  SYRINGE-NEEDLE, DISP, 3 ML (SAFETY SYRINGES/NEEDLE) 20G X 1-1/2" 3 ML MISC, 1 application by Does not apply route every 14 (fourteen) days., Disp: 50 each, Rfl: 2 .  tamsulosin (FLOMAX) 0.4 MG CAPS capsule, Take 1 capsule (0.4 mg total) by mouth daily., Disp: 30 capsule, Rfl: 3 .  temazepam (RESTORIL) 15 MG capsule, TAKE TWO CAPSULES BY MOUTH EVERY NIGHT AT BEDTIME, Disp: 60 capsule, Rfl: 5 .  testosterone cypionate (DEPOTESTOSTERONE CYPIONATE) 200 MG/ML injection, INJECT 1.5 ML INTRAMUSCULARLY EVERY 14 DAYS, Disp: 10 mL, Rfl: 0 .  traMADol  (ULTRAM) 50 MG tablet, Take 2 tablets (100 mg total) by mouth every 6 (six) hours as needed for moderate pain., Disp: 60 tablet, Rfl: 0  EXAM:  VITALS per patient if applicable:  GENERAL: alert, oriented, appears well and in no acute distress  HEENT: atraumatic, conjunttiva clear, no obvious abnormalities on inspection of external nose and ears  NECK: normal movements of the head and neck  LUNGS: on inspection no signs of respiratory distress, breathing rate appears normal, no obvious gross SOB, gasping or wheezing  CV: no obvious cyanosis  MS: moves all visible extremities without noticeable abnormality  PSYCH/NEURO: pleasant and cooperative, no obvious depression or anxiety, speech and thought processing grossly intact  ASSESSMENT AND PLAN: I am concerned he may be infected with the Covid-19 virus, so he will get tested today. To cover for a possible bronchitis we will send him some Augmentin. He will quarantine at home at least until the test result is back.  Alysia Penna, MD  Discussed the following assessment and plan:  Cough - Plan: HYDROcodone-homatropine (HYDROMET) 5-1.5 MG/5ML syrup     I discussed the assessment and treatment plan with the patient. The patient was provided an opportunity to ask questions and all were answered. The patient agreed with the plan and demonstrated an understanding of the instructions.   The patient was advised to call back or seek an in-person evaluation if the symptoms worsen or if the condition fails to improve as anticipated.

## 2019-07-29 NOTE — Telephone Encounter (Signed)
Pt called to check status. He now states his son went back to the pharmacy and found the bag in the parking lot. The bottle is busted and there is no medicine left in it. The pharmacy states they cannot help him. He needs another rx sent in ASAP and states he cannot get through the weekend without this. Please advise.

## 2019-07-30 LAB — NOVEL CORONAVIRUS, NAA: SARS-CoV-2, NAA: NOT DETECTED

## 2019-08-01 ENCOUNTER — Encounter: Payer: Self-pay | Admitting: Family Medicine

## 2019-08-01 MED ORDER — HYDROCODONE-HOMATROPINE 5-1.5 MG/5ML PO SYRP: 5.0000 mL | ORAL_SOLUTION | ORAL | 0 refills | Status: DC | PRN

## 2019-08-01 MED ORDER — AMOXICILLIN-POT CLAVULANATE 875-125 MG PO TABS: 1.0000 | ORAL_TABLET | Freq: Two times a day (BID) | ORAL | 0 refills | Status: DC

## 2019-08-01 NOTE — Telephone Encounter (Signed)
Patient need to schedule an ov for more refills. 

## 2019-08-01 NOTE — Telephone Encounter (Signed)
Pt stated that his son put the medication in his back pack and stated he lost it. Pt stated that his son called the pharmacy but no one has called the pharmacy stating that the medication was turned in. I verbally asked Dr.Fry for a refill which Dr.Fry sent in. I advise the pt that there will not be another refill if this medication is lost. Pt then stated that I was very rude to him and that I did not know the full story. Pt would like to speak to my supervisor. I advised the pt that it was 5 pm and I will forward the message to [place]. Pt verbalized understanding.

## 2019-08-01 NOTE — Telephone Encounter (Signed)
Patient calling to check the status of this. States that he would also need amoxicillin-clavulanate (AUGMENTIN) 875-125 MG tablet Because that was in the bag with the cough syrup. Please advise.

## 2019-08-01 NOTE — Telephone Encounter (Signed)
Both of these were sent in  

## 2019-08-01 NOTE — Telephone Encounter (Signed)
Okay for refill? Please advise 

## 2019-08-02 ENCOUNTER — Encounter: Payer: Self-pay | Admitting: Family Medicine

## 2019-08-02 NOTE — Telephone Encounter (Signed)
RN supervisor contacted and addressed concerns.

## 2019-08-04 NOTE — Telephone Encounter (Signed)
This has been taking care of. Pt notified of update. 

## 2019-08-04 NOTE — Telephone Encounter (Signed)
Yes please give hm a back to work note

## 2019-08-14 ENCOUNTER — Encounter: Payer: Self-pay | Admitting: Family Medicine

## 2019-08-16 NOTE — Telephone Encounter (Signed)
Please advise 

## 2019-08-17 NOTE — Telephone Encounter (Signed)
Call in Levaquin 500 mg daily for 10 days  

## 2019-08-22 MED ORDER — LEVOFLOXACIN 500 MG PO TABS: 500.0000 mg | ORAL_TABLET | Freq: Every day | ORAL | 0 refills | Status: DC

## 2019-08-22 NOTE — Telephone Encounter (Signed)
Called pt and advised  medication was sent in. Pt stated he is feeling a little better but will pick up the medication.

## 2019-08-31 ENCOUNTER — Telehealth (INDEPENDENT_AMBULATORY_CARE_PROVIDER_SITE_OTHER): Payer: Managed Care, Other (non HMO) | Admitting: Family Medicine

## 2019-08-31 ENCOUNTER — Other Ambulatory Visit: Payer: Self-pay

## 2019-08-31 ENCOUNTER — Encounter: Payer: Self-pay | Admitting: Family Medicine

## 2019-08-31 VITALS — Ht 68.0 in | Wt 215.0 lb

## 2019-08-31 DIAGNOSIS — R0981 Nasal congestion: Secondary | ICD-10-CM | POA: Diagnosis not present

## 2019-08-31 NOTE — Progress Notes (Signed)
Virtual Visit via Video Note  I connected with the patient on 08/31/19 at  1:00 PM EST by a video enabled telemedicine application and verified that I am speaking with the correct person using two identifiers.  Location patient: home Location provider:work or home office Persons participating in the virtual visit: patient, provider  I discussed the limitations of evaluation and management by telemedicine and the availability of in person appointments. The patient expressed understanding and agreed to proceed.   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 In the past month he has taken several antibiotics, most recently Levaquin, but these have not really made much difference. Today he feels very tired and did not go to work. He has not had any fevers. Using Mucinex and Robitussin and Tylenol.    ROS: See pertinent positives and negatives per HPI.  Past Medical History:  Diagnosis Date  . Allergy   . Anxiety   . Depression   . ED (erectile dysfunction)   . Heart murmur    at age 63 years old, heard x1 , never again  . History of kidney stones   . Hypertension   . Hypothyroidism   . PONV (postoperative nausea and vomiting)   . Warts, genital     Past Surgical History:  Procedure Laterality Date  . APPENDECTOMY    . COLONOSCOPY  11/08/2007   per Dr. Sharlett Iles, diverticulosis but no polyps, repeat in 10 yrs   . CYSTECTOMY    . EXTRACORPOREAL SHOCK WAVE LITHOTRIPSY Left 10/29/2017   Procedure: LEFT EXTRACORPOREAL SHOCK WAVE LITHOTRIPSY (ESWL);  Surgeon: Franchot Gallo, MD;  Location: [facility];  Service: Urology;  Laterality: Left;  . HERNIA REPAIR  [id number]   umbilical hernia  . KNEE ARTHROSCOPY     right knee  . TONSILLECTOMY      Family History  Problem Relation Age of Onset  . Hypertension Unknown        family hx  . Hyperlipidemia Unknown        family hx     Current Outpatient Medications:  .  ALPRAZolam (XANAX) 1 MG  tablet, TAKE ONE TABLET BY MOUTH EVERY 6 HOURS AS NEEDED FOR ANXIETY, Disp: 120 tablet, Rfl: 5 .  cyclobenzaprine (FLEXERIL) 10 MG tablet, Take 1 tablet (10 mg total) by mouth 3 (three) times daily as needed for muscle spasms., Disp: 60 tablet, Rfl: 2 .  diclofenac (VOLTAREN) 50 MG EC tablet, Take 1 tablet (50 mg total) by mouth 3 (three) times daily as needed for moderate pain., Disp: 90 tablet, Rfl: 5 .  fluticasone (FLONASE) 50 MCG/ACT nasal spray, SPRAY TWO SPRAYS IN EACH NOSTRIL ONCE DAILY, Disp: 16 g, Rfl: 11 .  halobetasol (ULTRAVATE) 0.05 % cream, APPLY TOPICALLY TWO TIMES A DAY (Patient taking differently: daily as needed. APPLY TOPICALLY TWO TIMES A DAY), Disp: 50 g, Rfl: 4 .  HYDROcodone-acetaminophen (NORCO) 10-325 MG tablet, Take 1 tablet by mouth every 6 (six) hours as needed for moderate pain., Disp: 20 tablet, Rfl: 0 .  HYDROcodone-homatropine (HYDROMET) 5-1.5 MG/5ML syrup, Take 5 mLs by mouth every 4 (four) hours as needed., Disp: 240 mL, Rfl: 0 .  levofloxacin (LEVAQUIN) 500 MG tablet, Take 1 tablet (500 mg total) by mouth daily., Disp: 7 tablet, Rfl: 0 .  levothyroxine (SYNTHROID) 75 MCG tablet, TAKE ONE TABLET BY MOUTH DAILY, Disp: 90 tablet, Rfl: 2 .  ondansetron (ZOFRAN) 4 MG tablet, Take 1 tablet (4 mg total) by  mouth every 8 (eight) hours as needed for nausea or vomiting., Disp: 60 tablet, Rfl: 5 .  sildenafil (REVATIO) 20 MG tablet, TAKE 1 TABLET (20 MG TOTAL) BY MOUTH AS NEEDED. (Patient taking differently: TAKE 1 TABLET (20 MG TOTAL) BY MOUTH AS NEEDED FOR ED), Disp: 30 tablet, Rfl: 11 .  SYRINGE-NEEDLE, DISP, 3 ML (SAFETY SYRINGES/NEEDLE) 20G X 1-1/2" 3 ML MISC, 1 application by Does not apply route every 14 (fourteen) days., Disp: 50 each, Rfl: 2 .  tamsulosin (FLOMAX) 0.4 MG CAPS capsule, Take 1 capsule (0.4 mg total) by mouth daily., Disp: 30 capsule, Rfl: 3 .  temazepam (RESTORIL) 15 MG capsule, TAKE TWO CAPSULES BY MOUTH EVERY NIGHT AT BEDTIME, Disp: 60 capsule, Rfl:  5 .  testosterone cypionate (DEPOTESTOSTERONE CYPIONATE) 200 MG/ML injection, INJECT 1.5 ML INTRAMUSCULARLY EVERY 14 DAYS, Disp: 10 mL, Rfl: 0 .  traMADol (ULTRAM) 50 MG tablet, Take 2 tablets (100 mg total) by mouth every 6 (six) hours as needed for moderate pain., Disp: 60 tablet, Rfl: 0  EXAM:  VITALS per patient if applicable:  GENERAL: alert, oriented, appears well and in no acute distress  HEENT: atraumatic, conjunttiva clear, no obvious abnormalities on inspection of external nose and ears  NECK: normal movements of the head and neck  LUNGS: on inspection no signs of respiratory distress, breathing rate appears normal, no obvious gross SOB, gasping or wheezing  CV: no obvious cyanosis  MS: moves all visible extremities without noticeable abnormality  PSYCH/NEURO: pleasant and cooperative, no obvious depression or anxiety, speech and thought processing grossly intact  ASSESSMENT AND PLAN: Chronic sinus congestion. We will write him out of work this week. Refer to ENT.  Alysia Penna, MD  Discussed the following assessment and plan:  No diagnosis found.     I discussed the assessment and treatment plan with the patient. The patient was provided an opportunity to ask questions and all were answered. The patient agreed with the plan and demonstrated an understanding of the instructions.   The patient was advised to call back or seek an in-person evaluation if the symptoms worsen or if the condition fails to improve as anticipated.

## 2019-09-01 ENCOUNTER — Encounter: Payer: Self-pay | Admitting: Family Medicine

## 2019-09-02 ENCOUNTER — Encounter: Payer: Self-pay | Admitting: Family Medicine

## 2019-09-05 NOTE — Telephone Encounter (Signed)
I cannot think of anything else to suggest. We have tried multiple antibiotics already. Hopefully he can see ENT soon

## 2019-09-06 ENCOUNTER — Encounter: Payer: Self-pay | Admitting: Family Medicine

## 2019-09-07 ENCOUNTER — Encounter: Payer: Self-pay | Admitting: Family Medicine

## 2019-09-22 ENCOUNTER — Ambulatory Visit (INDEPENDENT_AMBULATORY_CARE_PROVIDER_SITE_OTHER): Payer: Managed Care, Other (non HMO) | Admitting: Otolaryngology

## 2019-09-25 ENCOUNTER — Other Ambulatory Visit: Payer: Self-pay | Admitting: Internal Medicine

## 2019-09-26 NOTE — Telephone Encounter (Signed)
Last filled 04/20/2019 Last OV 08/31/2019  Ok to fill?

## 2019-09-26 NOTE — Telephone Encounter (Signed)
Fry patient.  

## 2019-09-29 ENCOUNTER — Ambulatory Visit (INDEPENDENT_AMBULATORY_CARE_PROVIDER_SITE_OTHER): Payer: Managed Care, Other (non HMO) | Admitting: Otolaryngology

## 2019-10-24 ENCOUNTER — Telehealth (INDEPENDENT_AMBULATORY_CARE_PROVIDER_SITE_OTHER): Payer: Managed Care, Other (non HMO) | Admitting: Family Medicine

## 2019-10-24 ENCOUNTER — Other Ambulatory Visit: Payer: Self-pay

## 2019-10-24 DIAGNOSIS — J069 Acute upper respiratory infection, unspecified: Secondary | ICD-10-CM | POA: Diagnosis not present

## 2019-10-24 NOTE — Progress Notes (Signed)
Virtual Visit via Video Note  I connected with the patient on 10/24/19 at 10:45 AM EST by a video enabled telemedicine application and verified that I am speaking with the correct person using two identifiers.  Location patient: home Location provider:work or home office Persons participating in the virtual visit: patient, provider  I discussed the limitations of evaluation and management by telemedicine and the availability of in person appointments. The patient expressed understanding and agreed to proceed.   HPI: Here for what he thinks is a simple cold. For the past 2 days he has had some stuffy head, runny nose, and a ST. No fever or body aches. No loss of taste or smell. No cough or SOB. No NVD. He is taking Tylenol Cold and Sinus and is drinking plenty of fluids. He missed work yesterday and today, but today he is feeling much better. He wants to go back to work tomorrow.    ROS: See pertinent positives and negatives per HPI.  Past Medical History:  Diagnosis Date  . Allergy   . Anxiety   . Depression   . ED (erectile dysfunction)   . Heart murmur    at age 17 years old, heard x1 , never again  . History of kidney stones   . Hypertension   . Hypothyroidism   . PONV (postoperative nausea and vomiting)   . Warts, genital     Past Surgical History:  Procedure Laterality Date  . APPENDECTOMY    . COLONOSCOPY  11/08/2007   per Dr. Sharlett Iles, diverticulosis but no polyps, repeat in 10 yrs   . CYSTECTOMY    . EXTRACORPOREAL SHOCK WAVE LITHOTRIPSY Left 10/29/2017   Procedure: LEFT EXTRACORPOREAL SHOCK WAVE LITHOTRIPSY (ESWL);  Surgeon: Franchot Gallo, MD;  Location: [facility];  Service: Urology;  Laterality: Left;  . HERNIA REPAIR  [id number]   umbilical hernia  . KNEE ARTHROSCOPY     right knee  . TONSILLECTOMY      Family History  Problem Relation Age of Onset  . Hypertension Unknown        family hx  . Hyperlipidemia Unknown        family hx     Current Outpatient  Medications:  .  ALPRAZolam (XANAX) 1 MG tablet, TAKE ONE TABLET BY MOUTH EVERY 6 HOURS AS NEEDED FOR ANXIETY, Disp: 120 tablet, Rfl: 5 .  cyclobenzaprine (FLEXERIL) 10 MG tablet, Take 1 tablet (10 mg total) by mouth 3 (three) times daily as needed for muscle spasms., Disp: 60 tablet, Rfl: 2 .  diclofenac (VOLTAREN) 50 MG EC tablet, Take 1 tablet (50 mg total) by mouth 3 (three) times daily as needed for moderate pain., Disp: 90 tablet, Rfl: 5 .  fluticasone (FLONASE) 50 MCG/ACT nasal spray, SPRAY TWO SPRAYS IN EACH NOSTRIL ONCE DAILY, Disp: 16 g, Rfl: 11 .  halobetasol (ULTRAVATE) 0.05 % cream, APPLY TOPICALLY TWO TIMES A DAY (Patient taking differently: daily as needed. APPLY TOPICALLY TWO TIMES A DAY), Disp: 50 g, Rfl: 4 .  HYDROcodone-acetaminophen (NORCO) 10-325 MG tablet, Take 1 tablet by mouth every 6 (six) hours as needed for moderate pain., Disp: 20 tablet, Rfl: 0 .  HYDROcodone-homatropine (HYDROMET) 5-1.5 MG/5ML syrup, Take 5 mLs by mouth every 4 (four) hours as needed., Disp: 240 mL, Rfl: 0 .  levofloxacin (LEVAQUIN) 500 MG tablet, Take 1 tablet (500 mg total) by mouth daily., Disp: 7 tablet, Rfl: 0 .  levothyroxine (SYNTHROID) 75 MCG tablet, TAKE ONE TABLET BY MOUTH DAILY, Disp: 90  tablet, Rfl: 2 .  ondansetron (ZOFRAN) 4 MG tablet, Take 1 tablet (4 mg total) by mouth every 8 (eight) hours as needed for nausea or vomiting., Disp: 60 tablet, Rfl: 5 .  sildenafil (REVATIO) 20 MG tablet, TAKE 1 TABLET (20 MG TOTAL) BY MOUTH AS NEEDED. (Patient taking differently: TAKE 1 TABLET (20 MG TOTAL) BY MOUTH AS NEEDED FOR ED), Disp: 30 tablet, Rfl: 11 .  SYRINGE-NEEDLE, DISP, 3 ML (SAFETY SYRINGES/NEEDLE) 20G X 1-1/2" 3 ML MISC, 1 application by Does not apply route every 14 (fourteen) days., Disp: 50 each, Rfl: 2 .  tamsulosin (FLOMAX) 0.4 MG CAPS capsule, Take 1 capsule (0.4 mg total) by mouth daily., Disp: 30 capsule, Rfl: 3 .  temazepam (RESTORIL) 15 MG capsule, TAKE TWO CAPSULES BY MOUTH EVERY  NIGHT AT BEDTIME, Disp: 60 capsule, Rfl: 5 .  testosterone cypionate (DEPOTESTOSTERONE CYPIONATE) 200 MG/ML injection, INJECT 1.5 MILLILITERS INTRAMUSCULARLY EVERY 14 DAYS, Disp: 10 mL, Rfl: 5 .  traMADol (ULTRAM) 50 MG tablet, Take 2 tablets (100 mg total) by mouth every 6 (six) hours as needed for moderate pain., Disp: 60 tablet, Rfl: 0  EXAM:  VITALS per patient if applicable:  GENERAL: alert, oriented, appears well and in no acute distress  HEENT: atraumatic, conjunttiva clear, no obvious abnormalities on inspection of external nose and ears  NECK: normal movements of the head and neck  LUNGS: on inspection no signs of respiratory distress, breathing rate appears normal, no obvious gross SOB, gasping or wheezing  CV: no obvious cyanosis  MS: moves all visible extremities without noticeable abnormality  PSYCH/NEURO: pleasant and cooperative, no obvious depression or anxiety, speech and thought processing grossly intact  ASSESSMENT AND PLAN: Viral URI. Use OTC measures as above. Recheck prn.  Alysia Penna, MD  Discussed the following assessment and plan:  No diagnosis found.     I discussed the assessment and treatment plan with the patient. The patient was provided an opportunity to ask questions and all were answered. The patient agreed with the plan and demonstrated an understanding of the instructions.   The patient was advised to call back or seek an in-person evaluation if the symptoms worsen or if the condition fails to improve as anticipated.

## 2019-11-29 ENCOUNTER — Other Ambulatory Visit: Payer: Self-pay | Admitting: Family Medicine

## 2019-11-29 NOTE — Telephone Encounter (Signed)
Last filled 06/01/2019 Last OV 10/24/2019  Ok to fill?

## 2019-12-15 ENCOUNTER — Ambulatory Visit: Payer: Managed Care, Other (non HMO) | Attending: Internal Medicine

## 2019-12-15 DIAGNOSIS — Z23 Encounter for immunization: Secondary | ICD-10-CM

## 2019-12-15 NOTE — Progress Notes (Signed)
   Covid-19 Vaccination Clinic  Name:  Richard Sellers    MRN: [id number] DOB: 03/19/1956  12/15/2019  Richard Sellers was observed post Covid-19 immunization for 15 minutes without incident. He was provided with Vaccine Information Sheet and instruction to access the V-Safe system.   Richard Sellers was instructed to call 911 with any severe reactions post vaccine: Marland Kitchen Difficulty breathing  . Swelling of face and throat  . A fast heartbeat  . A bad rash all over body  . Dizziness and weakness   Immunizations Administered    Name Date Dose VIS Date Route   Pfizer COVID-19 Vaccine 12/15/2019  2:05 PM 0.3 mL 09/02/2019 Intramuscular   Manufacturer: [place]   Lot: [id number]   [place]: [id number]

## 2020-01-03 ENCOUNTER — Encounter: Payer: Self-pay | Admitting: Family Medicine

## 2020-01-03 DIAGNOSIS — J329 Chronic sinusitis, unspecified: Secondary | ICD-10-CM

## 2020-01-03 NOTE — Telephone Encounter (Signed)
I put in a new referral  

## 2020-01-03 NOTE — Telephone Encounter (Signed)
Referral from 08/31/2019 has been closed. [facility] for new referral?

## 2020-01-09 ENCOUNTER — Ambulatory Visit: Payer: Managed Care, Other (non HMO) | Attending: Internal Medicine

## 2020-01-09 DIAGNOSIS — Z23 Encounter for immunization: Secondary | ICD-10-CM

## 2020-01-09 NOTE — Progress Notes (Signed)
   Covid-19 Vaccination Clinic  Name:  GASTON HARDEN    MRN: [id number] DOB: [birth year]  01/09/2020  Mr. Settle was observed post Covid-19 immunization for 15 minutes without incident. He was provided with Vaccine Information Sheet and instruction to access the V-Safe system.   Mr. Kless was instructed to call 911 with any severe reactions post vaccine: Marland Kitchen Difficulty breathing  . Swelling of face and throat  . A fast heartbeat  . A bad rash all over body  . Dizziness and weakness   Immunizations Administered    Name Date Dose VIS Date Route   Pfizer COVID-19 Vaccine 01/09/2020 12:50 PM 0.3 mL 11/16/2018 Intramuscular   Manufacturer: [place]   Lot: [id number]   [place]: [id number]

## 2020-02-06 ENCOUNTER — Ambulatory Visit (INDEPENDENT_AMBULATORY_CARE_PROVIDER_SITE_OTHER): Payer: Managed Care, Other (non HMO) | Admitting: Otolaryngology

## 2020-02-08 ENCOUNTER — Other Ambulatory Visit: Payer: Self-pay | Admitting: Family Medicine

## 2020-02-09 NOTE — Telephone Encounter (Signed)
Last filled 07/18/2019 Last OV 10/24/2019  Ok to fill?

## 2020-02-21 ENCOUNTER — Telehealth: Payer: Self-pay | Admitting: *Deleted

## 2020-02-21 NOTE — Telephone Encounter (Signed)
Patient called nurse triage on 02/19/2020. Patient states that he has sinus issues for a while and he has been tired for the last few months. He is also nauseated off and on for a while. Patient advised to see PCP

## 2020-02-22 NOTE — Telephone Encounter (Signed)
He should make an OV to talk about his sinuses. Keep the physical appt for the physical

## 2020-02-24 ENCOUNTER — Encounter: Payer: Self-pay | Admitting: Family Medicine

## 2020-02-27 ENCOUNTER — Ambulatory Visit: Payer: Managed Care, Other (non HMO) | Admitting: Family Medicine

## 2020-02-27 ENCOUNTER — Encounter: Payer: Self-pay | Admitting: Family Medicine

## 2020-02-27 ENCOUNTER — Telehealth: Payer: Self-pay

## 2020-02-27 ENCOUNTER — Other Ambulatory Visit: Payer: Self-pay

## 2020-02-27 VITALS — BP 140/80 | HR 88 | Temp 98.4°F

## 2020-02-27 DIAGNOSIS — E559 Vitamin D deficiency, unspecified: Secondary | ICD-10-CM | POA: Diagnosis not present

## 2020-02-27 DIAGNOSIS — R7401 Elevation of levels of liver transaminase levels: Secondary | ICD-10-CM

## 2020-02-27 DIAGNOSIS — N289 Disorder of kidney and ureter, unspecified: Secondary | ICD-10-CM

## 2020-02-27 MED ORDER — VITAMIN D 125 MCG (5000 UT) PO CAPS: 1.0000 | ORAL_CAPSULE | ORAL | 3 refills | Status: DC

## 2020-02-27 NOTE — Progress Notes (Signed)
   Subjective:    Patient ID: Richard Sellers, male    DOB: September 15, 1956, 64 y.o.   MRN: [id number]  HPI Here to follow up an urgent care visit on 02-19-20. At that time he was complaining of severe fatigue and some nausea without vomiting. He had extensive labs done that day which included a very low vitamin D at 13.8, as well as slightly low values for the transaminases and for GFR and creatinine. He says he was dehydrated that day. He was given fluids and told to follow up with [place]. He feels a little better today. He is drinking fluids.    Review of Systems  Constitutional: Positive for fatigue. Negative for chills, diaphoresis and fever.  Respiratory: Negative.   Cardiovascular: Negative.   Gastrointestinal: Positive for nausea. Negative for abdominal distention, abdominal pain, constipation and diarrhea.  Genitourinary: Negative.        Objective:   Physical Exam Constitutional:      Appearance: Normal appearance.  Cardiovascular:     Rate and Rhythm: Normal rate and regular rhythm.     Pulses: Normal pulses.     Heart sounds: Normal heart sounds.  Pulmonary:     Effort: Pulmonary effort is normal.     Breath sounds: Normal breath sounds.  Abdominal:     General: Abdomen is flat. Bowel sounds are normal. There is no distension.     Palpations: Abdomen is soft. There is no mass.     Tenderness: There is no abdominal tenderness. There is no guarding or rebound.     Hernia: No hernia is present.  Neurological:     Mental Status: He is alert.           Assessment & Plan:  Vitamin D deficiency, we will treat with 50,000 units weekly for 12 weeks. We will recheck a level at that time. He is scheduled for a well exam here next month, so we will repeat liver enzymes, a BMET, etc at that time. He is written out of work for today through tomorrow. Alysia Penna, MD

## 2020-02-27 NOTE — Telephone Encounter (Signed)
We saw him today

## 2020-02-27 NOTE — Telephone Encounter (Signed)
This was an error. Please call the pharmacist to cancel this prescription. Instead call in vitamin D to take 50,000 units weekly, #30 with 5 rf

## 2020-02-27 NOTE — Telephone Encounter (Signed)
Please advise. Should the patient be seen sooner?

## 2020-02-27 NOTE — Telephone Encounter (Signed)
Pharmacy called and stated that Cholecalciferol (VITAMIN D) 125 MCG (5000 UT) CAPS  Is usually taken once a day and wanted to make sure the prescription sent in was correct.  Please advise.

## 2020-02-28 ENCOUNTER — Encounter: Payer: Self-pay | Admitting: Family Medicine

## 2020-02-28 NOTE — Telephone Encounter (Signed)
Spoke with the pharmacy. The prescription has been corrected.

## 2020-02-28 NOTE — Telephone Encounter (Signed)
Please extend the work note

## 2020-02-28 NOTE — Telephone Encounter (Signed)
Ok to extend work note 

## 2020-02-29 ENCOUNTER — Encounter: Payer: Self-pay | Admitting: Family Medicine

## 2020-03-15 ENCOUNTER — Encounter: Payer: Managed Care, Other (non HMO) | Admitting: Family Medicine

## 2020-03-22 ENCOUNTER — Encounter: Payer: Self-pay | Admitting: Family Medicine

## 2020-03-22 ENCOUNTER — Other Ambulatory Visit: Payer: Self-pay

## 2020-03-22 ENCOUNTER — Telehealth: Payer: Self-pay | Admitting: Family Medicine

## 2020-03-22 ENCOUNTER — Ambulatory Visit (INDEPENDENT_AMBULATORY_CARE_PROVIDER_SITE_OTHER): Payer: Managed Care, Other (non HMO) | Admitting: Family Medicine

## 2020-03-22 VITALS — BP 140/80 | HR 89 | Temp 97.9°F | Ht 68.0 in

## 2020-03-22 DIAGNOSIS — E291 Testicular hypofunction: Secondary | ICD-10-CM | POA: Diagnosis not present

## 2020-03-22 DIAGNOSIS — Z0000 Encounter for general adult medical examination without abnormal findings: Secondary | ICD-10-CM | POA: Diagnosis not present

## 2020-03-22 DIAGNOSIS — E039 Hypothyroidism, unspecified: Secondary | ICD-10-CM | POA: Diagnosis not present

## 2020-03-22 LAB — HEPATIC FUNCTION PANEL
ALT: 43 U/L (ref 0–53)
AST: 31 U/L (ref 0–37)
Albumin: 4.5 g/dL (ref 3.5–5.2)
Alkaline Phosphatase: 80 U/L (ref 39–117)
Bilirubin, Direct: 0.1 mg/dL (ref 0.0–0.3)
Total Bilirubin: 0.6 mg/dL (ref 0.2–1.2)
Total Protein: 7.3 g/dL (ref 6.0–8.3)

## 2020-03-22 LAB — LIPID PANEL
Cholesterol: 246 mg/dL — ABNORMAL HIGH (ref 0–200)
HDL: 51.7 mg/dL (ref >39.00)
LDL Cholesterol: 157 mg/dL — ABNORMAL HIGH (ref 0–99)
NonHDL: 193.83
Total CHOL/HDL Ratio: 5
Triglycerides: 182 mg/dL — ABNORMAL HIGH (ref 0.0–149.0)
VLDL: 36.4 mg/dL (ref 0.0–40.0)

## 2020-03-22 LAB — CBC WITH DIFFERENTIAL/PLATELET
Basophils Absolute: 0 10*3/uL (ref 0.0–0.1)
Basophils Relative: 0.7 % (ref 0.0–3.0)
Eosinophils Absolute: 0.2 10*3/uL (ref 0.0–0.7)
Eosinophils Relative: 2.6 % (ref 0.0–5.0)
HCT: 44.3 % (ref 39.0–52.0)
Hemoglobin: 15.2 g/dL (ref 13.0–17.0)
Lymphocytes Relative: 20.7 % (ref 12.0–46.0)
Lymphs Abs: 1.4 10*3/uL (ref 0.7–4.0)
MCHC: 34.3 g/dL (ref 30.0–36.0)
MCV: 95.1 fl (ref 78.0–100.0)
Monocytes Absolute: 0.6 10*3/uL (ref 0.1–1.0)
Monocytes Relative: 8.2 % (ref 3.0–12.0)
Neutro Abs: 4.5 10*3/uL (ref 1.4–7.7)
Neutrophils Relative %: 67.8 % (ref 43.0–77.0)
Platelets: 273 10*3/uL (ref 150.0–400.0)
RBC: 4.66 Mil/uL (ref 4.22–5.81)
RDW: 13.9 % (ref 11.5–15.5)
WBC: 6.7 10*3/uL (ref 4.0–10.5)

## 2020-03-22 LAB — BASIC METABOLIC PANEL
BUN: 24 mg/dL — ABNORMAL HIGH (ref 6–23)
CO2: 24 mEq/L (ref 19–32)
Calcium: 9.7 mg/dL (ref 8.4–10.5)
Chloride: 105 mEq/L (ref 96–112)
Creatinine, Ser: 1.2 mg/dL (ref 0.40–1.50)
GFR: 61.04 mL/min (ref >60.00)
Glucose, Bld: 131 mg/dL — ABNORMAL HIGH (ref 70–99)
Potassium: 4.3 mEq/L (ref 3.5–5.1)
Sodium: 140 mEq/L (ref 135–145)

## 2020-03-22 LAB — T3, FREE: T3, Free: 3.4 pg/mL (ref 2.3–4.2)

## 2020-03-22 LAB — TESTOSTERONE: Testosterone: 183.91 ng/dL — ABNORMAL LOW (ref 300.00–890.00)

## 2020-03-22 LAB — T4, FREE: Free T4: 0.73 ng/dL (ref 0.60–1.60)

## 2020-03-22 LAB — PSA: PSA: 1.12 ng/mL (ref 0.10–4.00)

## 2020-03-22 LAB — TSH: TSH: 1.59 u[IU]/mL (ref 0.35–4.50)

## 2020-03-22 MED ORDER — HALOBETASOL PROPIONATE 0.05 % EX CREA: TOPICAL_CREAM | Freq: Two times a day (BID) | CUTANEOUS | 5 refills | Status: DC

## 2020-03-22 MED ORDER — TADALAFIL 20 MG PO TABS: 20.0000 mg | ORAL_TABLET | Freq: Every day | ORAL | 11 refills | Status: DC | PRN

## 2020-03-22 MED ORDER — LEVOTHYROXINE SODIUM 75 MCG PO TABS: 75.0000 ug | ORAL_TABLET | Freq: Every day | ORAL | 3 refills | Status: DC

## 2020-03-22 NOTE — Telephone Encounter (Signed)
Spoke with the patient, he is aware.

## 2020-03-22 NOTE — Addendum Note (Signed)
Addended by: Alysia Penna A on: 03/22/2020 11:47 AM   Modules accepted: Orders

## 2020-03-22 NOTE — Telephone Encounter (Signed)
Patient forgot to ask about a colonoscopy during his visit.  He wants a call to talk to someone about getting this set up.

## 2020-03-22 NOTE — Progress Notes (Signed)
   Subjective:    Patient ID: Richard Sellers, male    DOB: May 29, 1956, 64 y.o.   MRN: [id number]  HPI Here for a well exam. He feels well. He recently started on vitamin D supplementation.    Review of Systems  Constitutional: Negative.   HENT: Negative.   Eyes: Negative.   Respiratory: Negative.   Cardiovascular: Negative.   Gastrointestinal: Negative.   Genitourinary: Negative.   Musculoskeletal: Negative.   Skin: Negative.   Neurological: Negative.   Psychiatric/Behavioral: Negative.        Objective:   Physical Exam Constitutional:      General: He is not in acute distress.    Appearance: He is well-developed. He is not diaphoretic.  HENT:     Head: Normocephalic and atraumatic.     Right Ear: External ear normal.     Left Ear: External ear normal.     Nose: Nose normal.     Mouth/Throat:     Pharynx: No oropharyngeal exudate.  Eyes:     General: No scleral icterus.       Right eye: No discharge.        Left eye: No discharge.     Conjunctiva/sclera: Conjunctivae normal.     Pupils: Pupils are equal, round, and reactive to light.  Neck:     Thyroid: No thyromegaly.     Vascular: No JVD.     Trachea: No tracheal deviation.  Cardiovascular:     Rate and Rhythm: Normal rate and regular rhythm.     Heart sounds: Normal heart sounds. No murmur heard.  No friction rub. No gallop.   Pulmonary:     Effort: Pulmonary effort is normal. No respiratory distress.     Breath sounds: Normal breath sounds. No wheezing or rales.  Chest:     Chest wall: No tenderness.  Abdominal:     General: Bowel sounds are normal. There is no distension.     Palpations: Abdomen is soft. There is no mass.     Tenderness: There is no abdominal tenderness. There is no guarding or rebound.  Genitourinary:    Penis: Normal. No tenderness.      Testes: Normal.     Prostate: Normal.     Rectum: Normal. Guaiac result negative.  Musculoskeletal:        General: No tenderness. Normal  range of motion.     Cervical back: Neck supple.  Lymphadenopathy:     Cervical: No cervical adenopathy.  Skin:    General: Skin is warm and dry.     Coloration: Skin is not pale.     Findings: No erythema or rash.  Neurological:     Mental Status: He is alert and oriented to person, place, and time.     Cranial Nerves: No cranial nerve deficit.     Motor: No abnormal muscle tone.     Coordination: Coordination normal.     Deep Tendon Reflexes: Reflexes are normal and symmetric. Reflexes normal.  Psychiatric:        Behavior: Behavior normal.        Thought Content: Thought content normal.        Judgment: Judgment normal.           Assessment & Plan:  Well exam. We discussed diet and exercise. Get fasting labs.  Alysia Penna, MD

## 2020-03-22 NOTE — Telephone Encounter (Signed)
I agree. I will have the GI office contact him

## 2020-03-23 MED ORDER — TESTOSTERONE CYPIONATE 200 MG/ML IM SOLN: 200.0000 mg | INTRAMUSCULAR | 5 refills | Status: DC

## 2020-03-23 NOTE — Telephone Encounter (Signed)
See my Result Note  

## 2020-03-23 NOTE — Telephone Encounter (Signed)
No. All the thyroid levels were perfect

## 2020-03-29 ENCOUNTER — Encounter: Payer: Self-pay | Admitting: Gastroenterology

## 2020-04-05 ENCOUNTER — Other Ambulatory Visit: Payer: Self-pay

## 2020-04-06 ENCOUNTER — Encounter: Payer: Self-pay | Admitting: Family Medicine

## 2020-04-06 ENCOUNTER — Ambulatory Visit: Payer: Managed Care, Other (non HMO) | Admitting: Family Medicine

## 2020-04-06 VITALS — BP 140/80 | HR 104 | Temp 98.5°F

## 2020-04-06 DIAGNOSIS — R600 Localized edema: Secondary | ICD-10-CM | POA: Diagnosis not present

## 2020-04-06 DIAGNOSIS — M1990 Unspecified osteoarthritis, unspecified site: Secondary | ICD-10-CM

## 2020-04-06 DIAGNOSIS — F321 Major depressive disorder, single episode, moderate: Secondary | ICD-10-CM | POA: Diagnosis not present

## 2020-04-06 MED ORDER — FUROSEMIDE 20 MG PO TABS
20.0000 mg | ORAL_TABLET | Freq: Every day | ORAL | 3 refills | Status: DC
Start: 2020-04-06 — End: 2020-05-01

## 2020-04-06 MED ORDER — DICLOFENAC SODIUM 75 MG PO TBEC: 75.0000 mg | DELAYED_RELEASE_TABLET | Freq: Two times a day (BID) | ORAL | 2 refills | Status: DC

## 2020-04-06 MED ORDER — SERTRALINE HCL 50 MG PO TABS
50.0000 mg | ORAL_TABLET | Freq: Every day | ORAL | 3 refills | Status: DC
Start: 2020-04-06 — End: 2020-05-08

## 2020-04-06 NOTE — Progress Notes (Signed)
   Subjective:    Patient ID: Richard Sellers, male    DOB: 1955-12-30, 64 y.o.   MRN: [id number]  HPI Here for several issues. First his joint pains have been acting up the last few weeks, and he wants to get back on Diclofenac. Second his depression has been worse the past month and he wants to try a new medication for this. He has used Wellbutrin, Lexapro, and Cymbalta in the past. Third he has had swelling in the ankles for the past 3 weeks. He is not sure why this has happened. The swelling goes down when he elevates his legs. No SOB. His recent labs showed normal renal function.    Review of Systems  Constitutional: Negative.   Respiratory: Negative.   Cardiovascular: Positive for leg swelling. Negative for chest pain and palpitations.  Musculoskeletal: Positive for arthralgias.  Psychiatric/Behavioral: Positive for dysphoric mood. The patient is nervous/anxious.        Objective:   Physical Exam Constitutional:      Appearance: Normal appearance.  Cardiovascular:     Rate and Rhythm: Normal rate and regular rhythm.     Pulses: Normal pulses.     Heart sounds: Normal heart sounds.  Pulmonary:     Effort: Pulmonary effort is normal.     Breath sounds: Normal breath sounds.  Musculoskeletal:     Comments: 1+ edema in both ankles   Neurological:     Mental Status: He is alert.  Psychiatric:        Mood and Affect: Mood normal.        Behavior: Behavior normal.        Thought Content: Thought content normal.        Judgment: Judgment normal.           Assessment & Plan:  For the arthralgias he will use Diclofenac bid. For the ankle swelling he will try Lasix 20 mg daily. For the depression he will try Zoloft 50 mg daily.  Alysia Penna, MD

## 2020-04-07 ENCOUNTER — Telehealth: Payer: Self-pay | Admitting: Family Medicine

## 2020-04-07 NOTE — Telephone Encounter (Signed)
Called Saturday clinic in regards to testosterone px. 2 different directions written on sig. Unsure which one to take. I can not find anything documented in chart or with patient..   Advised to take 38ml every 7 days and contact PCP on Monday for clarification.   D.r Rogers Blocker

## 2020-04-09 MED ORDER — TESTOSTERONE CYPIONATE 200 MG/ML IM SOLN
200.0000 mg | INTRAMUSCULAR | 1 refills | Status: DC
Start: 2020-04-09 — End: 2020-11-20

## 2020-04-09 NOTE — Addendum Note (Signed)
Addended by: Alysia Penna A on: 04/09/2020 04:12 PM   Modules accepted: Orders

## 2020-04-09 NOTE — Telephone Encounter (Signed)
Called and spoke to Richard Sellers and she has corrected the prescription and stated that the patient has already picked up the correct dosage Rx. Sending as Juluis Rainier. Thank you!

## 2020-04-09 NOTE — Telephone Encounter (Signed)
He had previously taken 1.5 ml every 14 days, but we changed this to 1 ml every 7 days. I sent in the correct prescription. Please tell the pharmacy to disregard the previous prescription

## 2020-05-01 ENCOUNTER — Encounter: Payer: Self-pay | Admitting: Family Medicine

## 2020-05-01 ENCOUNTER — Ambulatory Visit: Payer: Managed Care, Other (non HMO) | Admitting: Family Medicine

## 2020-05-01 ENCOUNTER — Other Ambulatory Visit: Payer: Self-pay

## 2020-05-01 VITALS — BP 140/80 | HR 98

## 2020-05-01 DIAGNOSIS — I10 Essential (primary) hypertension: Secondary | ICD-10-CM | POA: Diagnosis not present

## 2020-05-01 DIAGNOSIS — K5732 Diverticulitis of large intestine without perforation or abscess without bleeding: Secondary | ICD-10-CM

## 2020-05-01 DIAGNOSIS — J019 Acute sinusitis, unspecified: Secondary | ICD-10-CM

## 2020-05-01 MED ORDER — AMOXICILLIN-POT CLAVULANATE 875-125 MG PO TABS
1.0000 | ORAL_TABLET | Freq: Two times a day (BID) | ORAL | 0 refills | Status: DC
Start: 2020-05-01 — End: 2020-05-21

## 2020-05-01 MED ORDER — LOSARTAN POTASSIUM 100 MG PO TABS
100.0000 mg | ORAL_TABLET | Freq: Every day | ORAL | 0 refills | Status: DC
Start: 2020-05-01 — End: 2020-06-04

## 2020-05-01 NOTE — Progress Notes (Signed)
   Subjective:    Patient ID: Richard Sellers, male    DOB: [birth year], 64 y.o.   MRN: [id number]  HPI Here for several issues. First he went to an urgent care clinic on 04-29-20 for several days of sinus congestion and PND. He was diagnosed with a sinus infection and he was given a 10 day course of Augmentin. Also that day his BP was 180/120 and he was started on Losartan 100 mg daily. Over the past 3 days his sinuses have been feeling better. However yesterday he also developed a sharp pain in the LLQ of his abdomen. No back pain. He has had some diarrhea for a few days. No blood in the stool. No urinary symptoms. He has never had diverticulitis before but during his colonoscopy in 2009 diverticula were seen his sigmoid colon. Today the LLQ pain is much better.    Review of Systems  Constitutional: Negative.   Respiratory: Negative.   Cardiovascular: Negative.   Gastrointestinal: Positive for abdominal pain and diarrhea. Negative for abdominal distention, anal bleeding, blood in stool, constipation, nausea, rectal pain and vomiting.  Genitourinary: Negative.        Objective:   Physical Exam Constitutional:      Appearance: Normal appearance. He is well-developed. He is not ill-appearing.  Cardiovascular:     Rate and Rhythm: Normal rate and regular rhythm.     Pulses: Normal pulses.     Heart sounds: Normal heart sounds.  Pulmonary:     Effort: Pulmonary effort is normal.     Breath sounds: Normal breath sounds.  Abdominal:     General: Abdomen is flat. Bowel sounds are normal. There is no distension.     Palpations: Abdomen is soft. There is no mass.     Tenderness: There is no guarding or rebound.     Hernia: No hernia is present.     Comments: He is mildly tender in the LLQ and the left flank   Neurological:     Mental Status: He is alert.           Assessment & Plan:  First his sinusitis seems to be improving. Second he has had a bout of diverticulitis, and this  seems to be responding to treatment with Augmentin. He will finish out the 10 days of this, and he will let us know if the abdominal pain does not resolve. Third, his HTN is responding well to Losartan so we will continue this.  Alysia Penna, MD

## 2020-05-01 NOTE — Addendum Note (Signed)
Addended by: Alysia Penna A on: 05/01/2020 05:18 PM   Modules accepted: Orders

## 2020-05-04 ENCOUNTER — Other Ambulatory Visit: Payer: Self-pay | Admitting: Family Medicine

## 2020-05-08 ENCOUNTER — Encounter: Payer: Self-pay | Admitting: Family Medicine

## 2020-05-08 MED ORDER — MIRTAZAPINE 30 MG PO TABS: 30.0000 mg | ORAL_TABLET | Freq: Every day | ORAL | 2 refills | Status: DC

## 2020-05-08 NOTE — Telephone Encounter (Signed)
I agree. Stop the Zoloft. Instead call in Mirtazepine 30 mg daily, #30 with 2 rf

## 2020-05-21 ENCOUNTER — Encounter: Payer: Self-pay | Admitting: Gastroenterology

## 2020-05-21 ENCOUNTER — Other Ambulatory Visit: Payer: Self-pay

## 2020-05-21 ENCOUNTER — Ambulatory Visit (AMBULATORY_SURGERY_CENTER): Payer: Self-pay

## 2020-05-21 VITALS — Ht 68.0 in | Wt 273.0 lb

## 2020-05-21 DIAGNOSIS — Z1211 Encounter for screening for malignant neoplasm of colon: Secondary | ICD-10-CM

## 2020-05-21 MED ORDER — SUTAB 1479-225-188 MG PO TABS: 1.0000 | ORAL_TABLET | ORAL | 0 refills | Status: DC

## 2020-05-21 NOTE — Progress Notes (Signed)
No egg or soy allergy known to patient  No issues with past sedation with any surgeries or procedures---hx PONV- No intubation problems in the past  No FH of Malignant Hyperthermia No diet pills per patient No home 02 use per patient  No blood thinners per patient  Pt denies issues with constipation  No A fib or A flutter  EMMI video via [place] 19 guidelines implemented in PV today with Pt and RN  Coupon given to pt in PV today , Code to Pharmacy  COVID vaccines completed on 12/2019 per pt;  Due to the COVID-19 pandemic we are asking patients to follow these guidelines. Please only bring one care partner. Please be aware that your care partner may wait in the car in the parking lot or if they feel like they will be too hot to wait in the car, they may wait in the lobby on the 4th floor. All care partners are required to wear a mask the entire time (we do not have any that we can provide them), they need to practice social distancing, and we will do a Covid check for all patient's and care partners when you arrive. Also we will check their temperature and your temperature. If the care partner waits in their car they need to stay in the parking lot the entire time and we will call them on their cell phone when the patient is ready for discharge so they can bring the car to the front of the building. Also all patient's will need to wear a mask into building.

## 2020-06-04 ENCOUNTER — Other Ambulatory Visit: Payer: Self-pay

## 2020-06-04 ENCOUNTER — Ambulatory Visit (AMBULATORY_SURGERY_CENTER): Payer: Managed Care, Other (non HMO) | Admitting: Gastroenterology

## 2020-06-04 ENCOUNTER — Encounter: Payer: Self-pay | Admitting: Gastroenterology

## 2020-06-04 VITALS — BP 188/109 | HR 80 | Temp 98.9°F | Resp 22 | Ht 68.0 in | Wt 273.0 lb

## 2020-06-04 DIAGNOSIS — Z1211 Encounter for screening for malignant neoplasm of colon: Secondary | ICD-10-CM

## 2020-06-04 DIAGNOSIS — D126 Benign neoplasm of colon, unspecified: Secondary | ICD-10-CM

## 2020-06-04 DIAGNOSIS — D124 Benign neoplasm of descending colon: Secondary | ICD-10-CM

## 2020-06-04 DIAGNOSIS — D120 Benign neoplasm of cecum: Secondary | ICD-10-CM

## 2020-06-04 DIAGNOSIS — D123 Benign neoplasm of transverse colon: Secondary | ICD-10-CM | POA: Diagnosis not present

## 2020-06-04 MED ORDER — LOSARTAN POTASSIUM 100 MG PO TABS: 100.0000 mg | ORAL_TABLET | Freq: Every day | ORAL | 0 refills | Status: DC

## 2020-06-04 MED ORDER — SODIUM CHLORIDE 0.9 % IV SOLN: 500.0000 mL | Freq: Once | INTRAVENOUS | Status: DC

## 2020-06-04 NOTE — Progress Notes (Signed)
Pt's states no medical or surgical changes since previsit or office visit.  SB - vitals 

## 2020-06-04 NOTE — Progress Notes (Signed)
Report to PACU, RN, vss, BBS= Clear.  

## 2020-06-04 NOTE — Op Note (Addendum)
[place] Patient Name: Richard Sellers Procedure Date: 06/04/2020 11:08 AM MRN: [id number] Endoscopist: Remo Lipps P. Havery Moros , MD Age: 64 Referring MD:  Date of Birth: 10-Jun-1956 Gender: Male Account #: [id number] Procedure:                Colonoscopy Indications:              Screening for colorectal malignant neoplasm Medicines:                Monitored Anesthesia Care Procedure:                Pre-Anesthesia Assessment:                           - Prior to the procedure, a History and Physical                            was performed, and patient medications and                            allergies were reviewed. The patient's tolerance of                            previous anesthesia was also reviewed. The risks                            and benefits of the procedure and the sedation                            options and risks were discussed with the patient.                            All questions were answered, and informed consent                            was obtained. Prior Anticoagulants: The patient has                            taken no previous anticoagulant or antiplatelet                            agents. ASA Grade Assessment: III - A patient with                            severe systemic disease. After reviewing the risks                            and benefits, the patient was deemed in                            satisfactory condition to undergo the procedure.                           After obtaining informed consent, the colonoscope  was passed under direct vision. Throughout the                            procedure, the patient's blood pressure, pulse, and                            oxygen saturations were monitored continuously. The                            Colonoscope was introduced through the anus and                            advanced to the the cecum, identified by                            appendiceal  orifice and ileocecal valve. The                            colonoscopy was performed without difficulty. The                            patient tolerated the procedure well. The quality                            of the bowel preparation was adequate. The                            ileocecal valve, appendiceal orifice, and rectum                            were photographed. Scope In: 11:15:03 AM Scope Out: 11:44:07 AM Scope Withdrawal Time: 0 hours 25 minutes 10 seconds  Total Procedure Duration: 0 hours 29 minutes 4 seconds  Findings:                 The perianal and digital rectal examinations were                            normal.                           Four sessile polyps were found in the cecum. The                            polyps were 3 mm in size. These polyps were removed                            with a cold snare. Resection and retrieval were                            complete.                           A 3 mm polyp was found in the hepatic flexure. The  polyp was sessile. The polyp was removed with a                            cold snare. Resection and retrieval were complete.                           A 3 mm polyp was found in the transverse colon. The                            polyp was sessile. The polyp was removed with a                            cold snare. Resection and retrieval were complete.                           A 4 mm polyp was found in the descending colon. The                            polyp was sessile. The polyp was removed with a                            cold snare. Resection and retrieval were complete.                           Multiple medium-mouthed diverticula were found in                            the entire colon.                           Internal hemorrhoids were found.                           The exam was otherwise without abnormality. Poor                            air retention in the left colon,  retroflexed views                            of the rectum were limited. Prep in right colon was                            fair - several minutes spent lavaging the right                            colon to achieve adequate views. Complications:            No immediate complications. Estimated blood loss:                            Minimal. Estimated Blood Loss:     Estimated blood loss was minimal. Impression:               - Four 3 mm polyps  in the cecum, removed with a                            cold snare. Resected and retrieved.                           - One 3 mm polyp at the hepatic flexure, removed                            with a cold snare. Resected and retrieved.                           - One 3 mm polyp in the transverse colon, removed                            with a cold snare. Resected and retrieved.                           - One 4 mm polyp in the descending colon, removed                            with a cold snare. Resected and retrieved.                           - Diverticulosis in the entire examined colon.                           - Internal hemorrhoids.                           - The examination was otherwise normal. Recommendation:           - Patient has a contact number available for                            emergencies. The signs and symptoms of potential                            delayed complications were discussed with the                            patient. Return to normal activities tomorrow.                            Written discharge instructions were provided to the                            patient.                           - Resume previous diet.                           - Continue present medications.                           -  Refill Losartan 100mg  / day for HTN (patient ran                            out 5 days ago, hypertensive today - asymptomatic)                           - Await pathology results. Remo Lipps P. Tieisha Darden,  MD 06/04/2020 11:49:47 AM This report has been signed electronically.

## 2020-06-04 NOTE — Patient Instructions (Signed)
Please read handouts provided. Continue present medications. Refill Losartan 100 mg daily. Await pathology results.     YOU HAD AN ENDOSCOPIC PROCEDURE TODAY AT [facility]:   Refer to the procedure report that was given to you for any specific questions about what was found during the examination.  If the procedure report does not answer your questions, please call your gastroenterologist to clarify.  If you requested that your care partner not be given the details of your procedure findings, then the procedure report has been included in a sealed envelope for you to review at your convenience later.  YOU SHOULD EXPECT: Some feelings of bloating in the abdomen. Passage of more gas than usual.  Walking can help get rid of the air that was put into your GI tract during the procedure and reduce the bloating. If you had a lower endoscopy (such as a colonoscopy or flexible sigmoidoscopy) you may notice spotting of blood in your stool or on the toilet paper. If you underwent a bowel prep for your procedure, you may not have a normal bowel movement for a few days.  Please Note:  You might notice some irritation and congestion in your nose or some drainage.  This is from the oxygen used during your procedure.  There is no need for concern and it should clear up in a day or so.  SYMPTOMS TO REPORT IMMEDIATELY:   Following lower endoscopy (colonoscopy or flexible sigmoidoscopy):  Excessive amounts of blood in the stool  Significant tenderness or worsening of abdominal pains  Swelling of the abdomen that is new, acute  Fever of 100F or higher   For urgent or emergent issues, a gastroenterologist can be reached at any hour by calling [phone]. Do not use MyChart messaging for urgent concerns.    DIET:  We do recommend a small meal at first, but then you may proceed to your regular diet.  Drink plenty of fluids but you should avoid alcoholic beverages for 24  hours.  ACTIVITY:  You should plan to take it easy for the rest of today and you should NOT DRIVE or use heavy machinery until tomorrow (because of the sedation medicines used during the test).    FOLLOW UP: Our staff will call the number listed on your records 48-72 hours following your procedure to check on you and address any questions or concerns that you may have regarding the information given to you following your procedure. If we do not reach you, we will leave a message.  We will attempt to reach you two times.  During this call, we will ask if you have developed any symptoms of COVID 19. If you develop any symptoms (ie: fever, flu-like symptoms, shortness of breath, cough etc.) before then, please call [phone].  If you test positive for Covid 19 in the 2 weeks post procedure, please call and report this information to [place].    If any biopsies were taken you will be contacted by phone or by letter within the next 1-3 weeks.  Please call us at [phone] if you have not heard about the biopsies in 3 weeks.    SIGNATURES/CONFIDENTIALITY: You and/or your care partner have signed paperwork which will be entered into your electronic medical record.  These signatures attest to the fact that that the information above on your After Visit Summary has been reviewed and is understood.  Full responsibility of the confidentiality of this discharge information lies with you and/or your  care-partner.

## 2020-06-04 NOTE — Progress Notes (Signed)
Called to room to assist during endoscopic procedure.  Patient ID and intended procedure confirmed with present staff. Received instructions for my participation in the procedure from the performing physician.  

## 2020-06-04 NOTE — Progress Notes (Signed)
Patient is out of his BP meds and has not had them in 5 days. Confirmed ok to do procedure with CRNA.

## 2020-06-06 ENCOUNTER — Telehealth: Payer: Self-pay

## 2020-06-06 NOTE — Telephone Encounter (Signed)
Patient did not want a phone call, no VM set up, [place] RN.

## 2020-07-01 ENCOUNTER — Other Ambulatory Visit: Payer: Self-pay | Admitting: Gastroenterology

## 2020-07-02 ENCOUNTER — Ambulatory Visit: Payer: Managed Care, Other (non HMO) | Admitting: Family Medicine

## 2020-07-02 ENCOUNTER — Other Ambulatory Visit: Payer: Self-pay

## 2020-07-02 ENCOUNTER — Encounter: Payer: Self-pay | Admitting: Family Medicine

## 2020-07-02 VITALS — BP 160/100 | HR 92 | Temp 98.6°F | Ht 68.0 in | Wt 273.0 lb

## 2020-07-02 DIAGNOSIS — I517 Cardiomegaly: Secondary | ICD-10-CM

## 2020-07-02 DIAGNOSIS — I10 Essential (primary) hypertension: Secondary | ICD-10-CM

## 2020-07-02 MED ORDER — METOPROLOL SUCCINATE ER 100 MG PO TB24: 100.0000 mg | ORAL_TABLET | Freq: Every day | ORAL | 3 refills | Status: DC

## 2020-07-02 MED ORDER — FUROSEMIDE 20 MG PO TABS: 20.0000 mg | ORAL_TABLET | Freq: Every day | ORAL | 3 refills | Status: DC

## 2020-07-02 NOTE — Progress Notes (Signed)
   Subjective:    Patient ID: Richard Sellers, male    DOB: 1955-10-18, 64 y.o.   MRN: [id number]  HPI Here for several issues. First he wants to follow up on a cracked rib. On 06-25-20 he woke up in bed choking from some GERD, and he had a hard coughing fit. He felt a sudden sharp pain in the right side. He went to a [facility] and Xrays revealed a non-displaced fracture in the right 6th rib. Since then he has had some pain, but the coughing stopped. Incidentally the CXR that day also showed cardiomegaly and some pulmonary vascular congestion. They did not mention this to him. He denies any unusual SOB or chest pain, no ankle swelling. Also his BP has been running high. He has been taking Losartan 100 mg daily, but this does not seem to be working.    Review of Systems  Constitutional: Negative.   Respiratory: Positive for cough. Negative for shortness of breath and wheezing.   Cardiovascular: Negative.        Objective:   Physical Exam Constitutional:      Appearance: Normal appearance. He is not ill-appearing.  Cardiovascular:     Rate and Rhythm: Normal rate and regular rhythm.     Pulses: Normal pulses.     Heart sounds: Normal heart sounds.  Pulmonary:     Effort: Pulmonary effort is normal. No respiratory distress.     Breath sounds: No stridor. Rhonchi present. No wheezing or rales.  Chest:     Chest wall: No tenderness.  Musculoskeletal:     Right lower leg: No edema.     Left lower leg: No edema.           Assessment & Plan:  For the HTN, we will stop the Losartan and begin Metoprolol succinate 100 mg daily. For the cardiomegaly and pulmonary congestion, he will start on Lasix 20 mg daily. We will set up an ECHO soon. Written out of work from yesterday until tomorrow.   Alysia Penna, MD

## 2020-07-18 ENCOUNTER — Other Ambulatory Visit: Payer: Self-pay | Admitting: Family Medicine

## 2020-07-19 NOTE — Telephone Encounter (Signed)
Last OV: 07/02/20 Last refill: 11/29/19 #60/5 refills

## 2020-07-23 ENCOUNTER — Telehealth (INDEPENDENT_AMBULATORY_CARE_PROVIDER_SITE_OTHER): Payer: Managed Care, Other (non HMO) | Admitting: Family Medicine

## 2020-07-23 ENCOUNTER — Encounter: Payer: Self-pay | Admitting: Family Medicine

## 2020-07-23 DIAGNOSIS — J069 Acute upper respiratory infection, unspecified: Secondary | ICD-10-CM | POA: Diagnosis not present

## 2020-07-23 NOTE — Progress Notes (Signed)
Subjective:    Patient ID: Richard Sellers, male    DOB: [birth year], 64 y.o.   MRN: [id number]  HPI , Virtual Visit via Video Note  I connected with the patient on 07/23/20 at 10:15 AM EDT by a video enabled telemedicine application and verified that I am speaking with the correct person using two identifiers.  Location patient: home Location provider:work or home office Persons participating in the virtual visit: patient, provider  I discussed the limitations of evaluation and management by telemedicine and the availability of in person appointments. The patient expressed understanding and agreed to proceed.   HPI: Here for 3 days of stuffy head, PND, and a dry cough. No fever or ST or SOB. No body aches or NVD. Drinking fluids and taking Tylenol.    ROS: See pertinent positives and negatives per HPI.  Past Medical History:  Diagnosis Date  . Allergy    seasonal allergies  . Anxiety    on meds  . Chronic kidney disease    hx of kidney stones  . Depression    on meds  . ED (erectile dysfunction)   . GERD (gastroesophageal reflux disease)    not on meds-diet controlled-uses OTC meds for tx  . Heart murmur    at age 53 years old, heard x1 , never again  . History of kidney stones   . Hyperlipidemia    on meds  . Hypertension    on meds  . Hypothyroidism    hx of-was on meds-  . PONV (postoperative nausea and vomiting)   . Warts, genital     Past Surgical History:  Procedure Laterality Date  . APPENDECTOMY  1975  . COLONOSCOPY  11/08/2007   per Dr. Sharlett Iles, diverticulosis but no polyps, repeat in 10 yrs   . CYSTECTOMY    . EXTRACORPOREAL SHOCK WAVE LITHOTRIPSY Left 10/29/2017   Procedure: LEFT EXTRACORPOREAL SHOCK WAVE LITHOTRIPSY (ESWL);  Surgeon: Franchot Gallo, MD;  Location: [facility];  Service: Urology;  Laterality: Left;  . HERNIA REPAIR  32/2025   umbilical hernia  . KNEE ARTHROSCOPY Right 1993  . TONSILLECTOMY    . WISDOM TOOTH EXTRACTION  1990     Family History  Problem Relation Age of Onset  . Hypertension Other        family hx  . Hyperlipidemia Other        family hx  . Colon polyps Neg Hx   . Colon cancer Neg Hx   . Esophageal cancer Neg Hx   . Stomach cancer Neg Hx   . Rectal cancer Neg Hx      Current Outpatient Medications:  .  ALPRAZolam (XANAX) 1 MG tablet, TAKE ONE TABLET BY MOUTH EVERY 6 HOURS AS NEEDED FOR ANXIETY, Disp: 120 tablet, Rfl: 5 .  Cholecalciferol (VITAMIN D) 125 MCG (5000 UT) CAPS, Take 1 capsule by mouth once a week., Disp: 12 capsule, Rfl: 3 .  diclofenac (VOLTAREN) 75 MG EC tablet, Take 1 tablet (75 mg total) by mouth 2 (two) times daily., Disp: 60 tablet, Rfl: 2 .  fexofenadine (ALLEGRA) 60 MG tablet, Take 60 mg by mouth 2 (two) times daily., Disp: , Rfl:  .  furosemide (LASIX) 20 MG tablet, Take 1 tablet (20 mg total) by mouth daily., Disp: 30 tablet, Rfl: 3 .  halobetasol (ULTRAVATE) 0.05 % cream, Apply topically 2 (two) times daily. APPLY TOPICALLY TWO TIMES A DAY, Disp: 50 g, Rfl: 5 .  metoprolol succinate (TOPROL-XL) 100 MG 24  hr tablet, Take 1 tablet (100 mg total) by mouth daily. Take with or immediately following a meal., Disp: 90 tablet, Rfl: 3 .  mirtazapine (REMERON) 30 MG tablet, Take 1 tablet (30 mg total) by mouth at bedtime., Disp: 30 tablet, Rfl: 2 .  SYRINGE-NEEDLE, DISP, 3 ML (SAFETY SYRINGES/NEEDLE) 20G X 1-1/2" 3 ML MISC, 1 application by Does not apply route every 14 (fourteen) days., Disp: 50 each, Rfl: 2 .  tadalafil (CIALIS) 20 MG tablet, Take 1 tablet (20 mg total) by mouth daily as needed for erectile dysfunction., Disp: 10 tablet, Rfl: 11 .  temazepam (RESTORIL) 15 MG capsule, TAKE TWO CAPSULES BY MOUTH EVERY NIGHT AT BEDTIME, Disp: 60 capsule, Rfl: 5 .  testosterone cypionate (DEPOTESTOSTERONE CYPIONATE) 200 MG/ML injection, Inject 1 mL (200 mg total) into the muscle every 7 (seven) days., Disp: 12 mL, Rfl: 1  EXAM:  VITALS per patient if applicable:  GENERAL:  alert, oriented, appears well and in no acute distress  HEENT: atraumatic, conjunttiva clear, no obvious abnormalities on inspection of external nose and ears  NECK: normal movements of the head and neck  LUNGS: on inspection no signs of respiratory distress, breathing rate appears normal, no obvious gross SOB, gasping or wheezing  CV: no obvious cyanosis  MS: moves all visible extremities without noticeable abnormality  PSYCH/NEURO: pleasant and cooperative, no obvious depression or anxiety, speech and thought processing grossly intact  ASSESSMENT AND PLAN: Viral URI. He will add Mucinex as needed. Written out of work from 07-22-20 until 07-25-20.  Alysia Penna, MD  Discussed the following assessment and plan:  No diagnosis found.     I discussed the assessment and treatment plan with the patient. The patient was provided an opportunity to ask questions and all were answered. The patient agreed with the plan and demonstrated an understanding of the instructions.   The patient was advised to call back or seek an in-person evaluation if the symptoms worsen or if the condition fails to improve as anticipated.     Review of Systems     Objective:   Physical Exam        Assessment & Plan:

## 2020-08-09 ENCOUNTER — Telehealth: Payer: Self-pay | Admitting: Family Medicine

## 2020-08-09 DIAGNOSIS — I517 Cardiomegaly: Secondary | ICD-10-CM

## 2020-08-09 NOTE — Telephone Encounter (Signed)
Per  Echo Edmon Crape the patient need a new order for - old one expired   ECHOCARDIOGRAM COMPLETE Status: Canceled (Order Expired) Requested appt date: 07/09/2020 (Approximate) Authorizing: Laurey Morale, MD in [facility] Expires: Expired (08/02/2020) Priority: Routine Diagnosis: Cardiomegaly [I51.7]

## 2020-08-10 NOTE — Telephone Encounter (Signed)
New order has been entered.

## 2020-08-13 ENCOUNTER — Other Ambulatory Visit: Payer: Self-pay | Admitting: Family Medicine

## 2020-08-14 ENCOUNTER — Other Ambulatory Visit: Payer: Self-pay | Admitting: Family Medicine

## 2020-08-14 NOTE — Telephone Encounter (Signed)
Received a refill request for: Alprazolam 1 mg LR 02/09/20, #120, 5 rfs LOV 07/02/20 FOV none scheduled.   Please review and advise.   Thanks.  Dm/cma

## 2020-08-27 ENCOUNTER — Ambulatory Visit (HOSPITAL_COMMUNITY): Payer: Managed Care, Other (non HMO) | Attending: Cardiology

## 2020-08-27 ENCOUNTER — Other Ambulatory Visit: Payer: Self-pay

## 2020-08-27 DIAGNOSIS — I517 Cardiomegaly: Secondary | ICD-10-CM | POA: Diagnosis not present

## 2020-08-27 LAB — ECHOCARDIOGRAM COMPLETE
Area-P 1/2: 3.28 cm2
S' Lateral: 2.8 cm

## 2020-08-27 MED ORDER — PERFLUTREN LIPID MICROSPHERE
1.0000 mL | INTRAVENOUS | Status: AC | PRN
  Administered 2020-08-27: 3 mL via INTRAVENOUS

## 2020-08-28 ENCOUNTER — Encounter: Payer: Self-pay | Admitting: Family Medicine

## 2020-08-28 NOTE — Progress Notes (Signed)
Normal heart structure and function

## 2020-10-10 ENCOUNTER — Encounter: Payer: Self-pay | Admitting: Family Medicine

## 2020-10-10 DIAGNOSIS — J329 Chronic sinusitis, unspecified: Secondary | ICD-10-CM

## 2020-10-10 NOTE — Telephone Encounter (Signed)
The referral was done  

## 2020-10-12 ENCOUNTER — Encounter: Payer: Self-pay | Admitting: Family Medicine

## 2020-10-12 NOTE — Telephone Encounter (Signed)
I have no idea. He needs to check with his insurance as to who is in his network and pick from there

## 2020-10-18 ENCOUNTER — Encounter: Payer: Self-pay | Admitting: Family Medicine

## 2020-11-15 ENCOUNTER — Ambulatory Visit (INDEPENDENT_AMBULATORY_CARE_PROVIDER_SITE_OTHER): Payer: 59 | Admitting: Licensed Clinical Social Worker

## 2020-11-15 ENCOUNTER — Other Ambulatory Visit: Payer: Self-pay

## 2020-11-15 ENCOUNTER — Telehealth: Payer: Self-pay | Admitting: Family Medicine

## 2020-11-15 DIAGNOSIS — F331 Major depressive disorder, recurrent, moderate: Secondary | ICD-10-CM

## 2020-11-15 DIAGNOSIS — F411 Generalized anxiety disorder: Secondary | ICD-10-CM | POA: Diagnosis not present
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
## 2020-11-15 NOTE — Telephone Encounter (Signed)
Patient is calling and wanted to see if the provider can switch up his antidepressants because the medication he is currently on is not helping, please advise. CB is [phone]

## 2020-11-16 ENCOUNTER — Other Ambulatory Visit: Payer: Self-pay | Admitting: Family Medicine

## 2020-11-16 NOTE — Telephone Encounter (Signed)
Set up an OV for this

## 2020-11-16 NOTE — Telephone Encounter (Signed)
Spoke with patient about scheduling office visit.   Will call back, to schedule at his convince.

## 2020-11-19 NOTE — Telephone Encounter (Signed)
Last refill-  04/09/2020- 1 refill Last office visit--07/02/2020  No future appointment schedule

## 2020-11-26 ENCOUNTER — Other Ambulatory Visit: Payer: Self-pay

## 2020-11-26 ENCOUNTER — Ambulatory Visit (INDEPENDENT_AMBULATORY_CARE_PROVIDER_SITE_OTHER): Payer: 59 | Admitting: Licensed Clinical Social Worker

## 2020-11-26 DIAGNOSIS — F331 Major depressive disorder, recurrent, moderate: Secondary | ICD-10-CM

## 2020-11-26 DIAGNOSIS — F411 Generalized anxiety disorder: Secondary | ICD-10-CM | POA: Diagnosis not present
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
## 2020-11-27 ENCOUNTER — Ambulatory Visit: Payer: Managed Care, Other (non HMO) | Admitting: Family Medicine

## 2020-11-27 ENCOUNTER — Other Ambulatory Visit: Payer: Self-pay

## 2020-11-27 ENCOUNTER — Encounter: Payer: Self-pay | Admitting: Family Medicine

## 2020-11-27 VITALS — BP 152/110 | HR 63 | Temp 98.4°F

## 2020-11-27 DIAGNOSIS — J3089 Other allergic rhinitis: Secondary | ICD-10-CM

## 2020-11-27 DIAGNOSIS — F418 Other specified anxiety disorders: Secondary | ICD-10-CM | POA: Diagnosis not present

## 2020-11-27 MED ORDER — DULOXETINE HCL 60 MG PO CPEP: 60.0000 mg | ORAL_CAPSULE | Freq: Every day | ORAL | 3 refills | Status: DC

## 2020-11-27 NOTE — Progress Notes (Unsigned)
   Subjective:    Patient ID: Richard Sellers, male    DOB: 1956-02-06, 65 y.o.   MRN: [id number]  HPI Here for several issues. First he wants to discuss his chronic sinus congestion. He has taken multiple rounds of antibiotics this winter for apparent infections, but his underlying congestion never goes away. He saw Jolene Provost PA at [facility] on 11-08-20, and she ordered a sinus CT scan which was unremarkable. He therefore is not a surgical candidate. She started him on Flonase every day, and so far he feels a little better. Also his depression and anxiety have been much more of a problem lately. He has been taking Remeron, but this has not helped at all. Over the years he has also tried Lexapro, Celexa, Cymbalta, and Wellbutrin, but the Cymbalta seemed to help the most. He wants to try this again. He has started seeing a therapist as well, and he plans to meet with her twice a month.    Review of Systems  Constitutional: Negative.   HENT: Positive for congestion, postnasal drip and sinus pressure. Negative for ear pain and sore throat.   Eyes: Negative.   Respiratory: Negative.   Cardiovascular: Negative.   Psychiatric/Behavioral: Positive for decreased concentration and dysphoric mood. Negative for agitation, behavioral problems, confusion, hallucinations, self-injury and suicidal ideas. The patient is nervous/anxious.        Objective:   Physical Exam Constitutional:      Appearance: Normal appearance.  HENT:     Right Ear: Tympanic membrane, ear canal and external ear normal.     Left Ear: Tympanic membrane, ear canal and external ear normal.     Nose: Nose normal.     Mouth/Throat:     Pharynx: Oropharynx is clear.  Cardiovascular:     Rate and Rhythm: Normal rate and regular rhythm.     Pulses: Normal pulses.     Heart sounds: Normal heart sounds.  Pulmonary:     Effort: Pulmonary effort is normal.     Breath sounds: Normal breath sounds.  Lymphadenopathy:      Cervical: No cervical adenopathy.  Neurological:     General: No focal deficit present.     Mental Status: He is alert. Mental status is at baseline.  Psychiatric:        Mood and Affect: Mood normal.        Behavior: Behavior normal.        Thought Content: Thought content normal.        Judgment: Judgment normal.           Assessment & Plan:  For his sinus congestion, we agreed that he will stick with the Flonase for now, but I cautioned him that he must use this consistently for it to work. For the depression and anxiety, he will start back on Cymbalta 60 mg daily. Recheck in 4 weeks.  Alysia Penna, MD

## 2020-11-28 DIAGNOSIS — F418 Other specified anxiety disorders: Secondary | ICD-10-CM | POA: Insufficient documentation

## 2020-11-28 DIAGNOSIS — J309 Allergic rhinitis, unspecified: Secondary | ICD-10-CM | POA: Insufficient documentation

## 2020-11-30 ENCOUNTER — Telehealth: Payer: Self-pay | Admitting: Family Medicine

## 2020-11-30 MED ORDER — DULOXETINE HCL 30 MG PO CPEP: 30.0000 mg | ORAL_CAPSULE | Freq: Every day | ORAL | 3 refills | Status: DC

## 2020-11-30 NOTE — Telephone Encounter (Signed)
Pt is calling in stating that the new Rx duloxetine (CYMBALTA) 60 MG is making him sick 1st day he slept all day and 2nd day very nauseated.  Pt would like to know if he should continue to take the medication and would like to have a call back to let him know what to do.

## 2020-11-30 NOTE — Telephone Encounter (Signed)
Please advise 

## 2020-11-30 NOTE — Telephone Encounter (Signed)
The 60 mg dose may be too strong for him. I sent in a new rx for the 30 mg dose instead

## 2020-11-30 NOTE — Telephone Encounter (Signed)
Spoke with pt advised that Dr Sarajane Jews sent in a lower dose of Cymbalta to his pharmacy, verbalized understanding

## 2020-12-07 ENCOUNTER — Encounter: Payer: Self-pay | Admitting: Family Medicine

## 2020-12-07 ENCOUNTER — Other Ambulatory Visit: Payer: Self-pay

## 2020-12-07 ENCOUNTER — Telehealth: Payer: Self-pay | Admitting: Family Medicine

## 2020-12-07 ENCOUNTER — Ambulatory Visit: Payer: Managed Care, Other (non HMO) | Admitting: Family Medicine

## 2020-12-07 VITALS — BP 160/108 | HR 68 | Temp 98.5°F

## 2020-12-07 DIAGNOSIS — F418 Other specified anxiety disorders: Secondary | ICD-10-CM | POA: Diagnosis not present

## 2020-12-07 MED ORDER — CITALOPRAM HYDROBROMIDE 20 MG PO TABS: 20.0000 mg | ORAL_TABLET | Freq: Every day | ORAL | 3 refills | Status: DC

## 2020-12-07 NOTE — Telephone Encounter (Signed)
Patient is calling and stated that he still can't tolerate Cymbalta and wanted to see if something else can be prescribed, please advise. CB is [phone]

## 2020-12-07 NOTE — Telephone Encounter (Signed)
Noted. Stop the Cymbalta. Call in Buspar 5 mg to take BID, #60 with 2 rf

## 2020-12-07 NOTE — Telephone Encounter (Signed)
Cymbalta prescrition has been d/c per Dr Sarajane Jews advise. New Rx for Buspar not sent to pt pharmacy since pt has an appointment this afternoon with Dr Sarajane Jews and will discuss about changing it to something different

## 2020-12-07 NOTE — Progress Notes (Signed)
   Subjective:    Patient ID: Richard Sellers, male    DOB: Feb 01, 1956, 65 y.o.   MRN: [id number]  HPI Here to follow up on depression with anxiety. He had tried Cymbalta but this gave him a lot of side effects so he stopped it. He had used Celexa in the past and he asks to try this again.    Review of Systems  Constitutional: Negative.   Respiratory: Negative.   Cardiovascular: Negative.   Psychiatric/Behavioral: Positive for decreased concentration and dysphoric mood. Negative for agitation, behavioral problems and confusion. The patient is nervous/anxious.        Objective:   Physical Exam Constitutional:      Appearance: Normal appearance.  Cardiovascular:     Rate and Rhythm: Normal rate and regular rhythm.     Pulses: Normal pulses.     Heart sounds: Normal heart sounds.  Pulmonary:     Effort: Pulmonary effort is normal.     Breath sounds: Normal breath sounds.  Neurological:     Mental Status: He is alert.  Psychiatric:        Mood and Affect: Mood normal.        Behavior: Behavior normal.        Thought Content: Thought content normal.        Judgment: Judgment normal.           Assessment & Plan:  Depression with anxiety, try Celexa 20 mg daily. Alysia Penna, MD

## 2020-12-10 ENCOUNTER — Ambulatory Visit (HOSPITAL_COMMUNITY): Payer: 59 | Admitting: Licensed Clinical Social Worker

## 2020-12-10 ENCOUNTER — Other Ambulatory Visit: Payer: Self-pay

## 2020-12-24 ENCOUNTER — Ambulatory Visit (INDEPENDENT_AMBULATORY_CARE_PROVIDER_SITE_OTHER): Payer: 59 | Admitting: Licensed Clinical Social Worker

## 2020-12-24 ENCOUNTER — Other Ambulatory Visit: Payer: Self-pay

## 2020-12-24 DIAGNOSIS — F331 Major depressive disorder, recurrent, moderate: Secondary | ICD-10-CM

## 2020-12-24 DIAGNOSIS — F411 Generalized anxiety disorder: Secondary | ICD-10-CM | POA: Diagnosis not present

## 2020-12-24 NOTE — Progress Notes (Signed)
THERAPIST PROGRESS NOTE   Session Time: 3:00pm - 4:00pm  Location: Patient: [facility] Provider: [facility]    Participation Level: Active   Behavioral Response: Alert, casually dressed, anxious mood/affect   Type of Therapy:  Individual Therapy   Treatment Goals addressed: Medication compliance; Anxiety, panic attack, and depression management; Monitoring alcohol use   Interventions: CBT: challenging anxious thoughts   Summary: Richard Sellers, who prefers to go by "Richard Sellers" is a 65 year old divorced homosexual Caucasian male that presented for therapy session today with diagnoses of Generalized Anxiety Disorder, and Major Depressive Disorder, recurrent, moderate.     Suicidal/Homicidal: None; without plan or intent.    Therapist Response: Clinician met with Richard Sellers for in-person therapy appointment and assessed for safety, medication compliance, and sobriety.  Richard Sellers presented for today's session on time and was alert, oriented x5, with no evidence or self-report of SI/HI or A/V H.  Richard Sellers reported that he remains compliant with medication, including new script of Zoloft for 1.5 weeks due to bad reaction with Cymbalta.  He stated "My doctor switched me over after I complained and I think its helping me".  Richard Sellers reported that he also abstained from drinking for a week due to a stomach virus, and has now reduced to drinking x2 beers per week due to worry about potential medication interactions.  Clinician inquired about [name]'s emotional ratings today, as well as any significant changes in thoughts, feelings, or behavior since last check-in.  Richard Sellers reported scores of 6/10 for depression, 7/10 for anxiety and 0/10 for anger/irritability.  Richard Sellers reported that he continues experiencing minor panic attacks throughout each day, stating "None of them are full blown, they're still little ones".  Richard Sellers reported that he woke up feeling very anxious today, and admitted to almost cancelling our appointment until  he spoke with a friend about these fears.  Richard Sellers reported that anxious thoughts have been more frequent due to his mother's old age, although he has always struggled to control them.  Clinician utilized CBT handout today with Richard Sellers in order to assist him in identifying specific worries, comparing evidence to support or dispute these thoughts to promote rational thinking, and explore strategies for improving coping ability should worst case scenario actually occur.  Intervention was effective, as evidenced by Richard Sellers participating in exercise and acknowledging that when he expects the worst possible outcome without sufficient evidence, he tends to experience more negative emotions such as depression and anxiety, and then isolates more, which cuts him off from support system, and makes it more difficult for him to accomplish daily tasks.  Richard Sellers stated "Once it starts, its hard for me to get ahold of it, so it just grows and grows, but I'm Sellers of myself for coming today because it reinforced the fact that talking to other people can calm me down".  Clinician will continue to monitor.    Plan: Follow up again in 2 weeks.   Diagnosis:  Generalized Anxiety Disorder, and Major Depressive Disorder, recurrent, moderate.   Shade Flood, LCSW, LCAS 12/24/20

## 2021-01-07 ENCOUNTER — Ambulatory Visit (HOSPITAL_COMMUNITY): Payer: 59 | Admitting: Licensed Clinical Social Worker

## 2021-01-07 ENCOUNTER — Telehealth (HOSPITAL_COMMUNITY): Payer: Self-pay | Admitting: Licensed Clinical Social Worker

## 2021-01-07 ENCOUNTER — Other Ambulatory Visit: Payer: Self-pay

## 2021-01-07 NOTE — Telephone Encounter (Signed)
Richard Sellers had an in-person appointment scheduled today at 4pm.  Clinician Erskine Emery by phone at 4:10pm when he had not presented on time for session.  Richard Sellers answered this call, and reported that he had attempted to cancel appointment in a phone call on Friday to outpatient office, and wasn't sure why he still showed up on clinician's schedule today.  Richard Sellers reported that he is doing alright at the moment, and would follow up on Monday of next week instead.  Clinician informed front desk of cancellation and will continue to monitor.    Shade Flood, LCSW. LCAS 01/07/21

## 2021-01-21 ENCOUNTER — Other Ambulatory Visit: Payer: Self-pay

## 2021-01-21 ENCOUNTER — Ambulatory Visit (INDEPENDENT_AMBULATORY_CARE_PROVIDER_SITE_OTHER): Payer: 59 | Admitting: Licensed Clinical Social Worker

## 2021-01-21 DIAGNOSIS — F411 Generalized anxiety disorder: Secondary | ICD-10-CM

## 2021-01-21 DIAGNOSIS — F331 Major depressive disorder, recurrent, moderate: Secondary | ICD-10-CM | POA: Diagnosis not present
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
## 2021-01-29 ENCOUNTER — Telehealth: Payer: Self-pay | Admitting: Family Medicine

## 2021-01-29 NOTE — Telephone Encounter (Signed)
Patient is calling and stated that he just started taking Zoloft and wanted to see if provider could increase dosage, please advise. CB is [phone]

## 2021-01-29 NOTE — Telephone Encounter (Signed)
Spoke with pt state that he is taking Celexa 20 mg and is requesting for increase in the dose, state that he has not felt the full effect of the medication. Please advise

## 2021-01-30 ENCOUNTER — Other Ambulatory Visit: Payer: Self-pay

## 2021-01-30 MED ORDER — CITALOPRAM HYDROBROMIDE 40 MG PO TABS: 40.0000 mg | ORAL_TABLET | Freq: Every day | ORAL | 2 refills | Status: DC

## 2021-01-30 NOTE — Telephone Encounter (Signed)
Increase the dose of Celexa to 40 mg daily. Call in #30 with 2 rf

## 2021-01-30 NOTE — Telephone Encounter (Signed)
New Rx sent pt aware to complete the 20 mg before by taking 2 until finished and then start taking one 40 mg  daily

## 2021-02-11 ENCOUNTER — Ambulatory Visit (HOSPITAL_COMMUNITY): Payer: 59 | Admitting: Licensed Clinical Social Worker

## 2021-02-15 ENCOUNTER — Encounter (HOSPITAL_COMMUNITY): Payer: Self-pay

## 2021-02-15 ENCOUNTER — Other Ambulatory Visit: Payer: Self-pay

## 2021-02-15 ENCOUNTER — Emergency Department (HOSPITAL_COMMUNITY)
Admission: EM | Admit: 2021-02-15 | Discharge: 2021-02-15 | Disposition: A | Discharge status: Home / Self Care | Payer: Managed Care, Other (non HMO) | Attending: Emergency Medicine | Admitting: Emergency Medicine

## 2021-02-15 DIAGNOSIS — Z87891 Personal history of nicotine dependence: Secondary | ICD-10-CM | POA: Diagnosis not present

## 2021-02-15 DIAGNOSIS — N189 Chronic kidney disease, unspecified: Secondary | ICD-10-CM | POA: Insufficient documentation

## 2021-02-15 DIAGNOSIS — G4700 Insomnia, unspecified: Secondary | ICD-10-CM | POA: Diagnosis not present

## 2021-02-15 DIAGNOSIS — I129 Hypertensive chronic kidney disease with stage 1 through stage 4 chronic kidney disease, or unspecified chronic kidney disease: Secondary | ICD-10-CM | POA: Diagnosis not present

## 2021-02-15 DIAGNOSIS — Z79899 Other long term (current) drug therapy: Secondary | ICD-10-CM | POA: Insufficient documentation

## 2021-02-15 DIAGNOSIS — E039 Hypothyroidism, unspecified: Secondary | ICD-10-CM | POA: Diagnosis not present

## 2021-02-15 DIAGNOSIS — R5383 Other fatigue: Secondary | ICD-10-CM | POA: Diagnosis present

## 2021-02-15 DIAGNOSIS — I10 Essential (primary) hypertension: Secondary | ICD-10-CM

## 2021-02-15 LAB — CBC WITH DIFFERENTIAL/PLATELET
Abs Immature Granulocytes: 0.07 10*3/uL (ref 0.00–0.07)
Basophils Absolute: 0.1 10*3/uL (ref 0.0–0.1)
Basophils Relative: 1 %
Eosinophils Absolute: 0.1 10*3/uL (ref 0.0–0.5)
Eosinophils Relative: 1 %
HCT: 55.4 % — ABNORMAL HIGH (ref 39.0–52.0)
Hemoglobin: 18.3 g/dL — ABNORMAL HIGH (ref 13.0–17.0)
Immature Granulocytes: 1 %
Lymphocytes Relative: 19 %
Lymphs Abs: 2 10*3/uL (ref 0.7–4.0)
MCH: 30.5 pg (ref 26.0–34.0)
MCHC: 33 g/dL (ref 30.0–36.0)
MCV: 92.3 fL (ref 80.0–100.0)
Monocytes Absolute: 0.7 10*3/uL (ref 0.1–1.0)
Monocytes Relative: 7 %
Neutro Abs: 7.7 10*3/uL (ref 1.7–7.7)
Neutrophils Relative %: 71 %
Platelets: 377 10*3/uL (ref 150–400)
RBC: 6 MIL/uL — ABNORMAL HIGH (ref 4.22–5.81)
RDW: 15 % (ref 11.5–15.5)
WBC: 10.7 10*3/uL — ABNORMAL HIGH (ref 4.0–10.5)
nRBC: 0 % (ref 0.0–0.2)

## 2021-02-15 LAB — BASIC METABOLIC PANEL
Anion gap: 8 (ref 5–15)
BUN: 26 mg/dL — ABNORMAL HIGH (ref 8–23)
CO2: 23 mmol/L (ref 22–32)
Calcium: 10.1 mg/dL (ref 8.9–10.3)
Chloride: 106 mmol/L (ref 98–111)
Creatinine, Ser: 1.53 mg/dL — ABNORMAL HIGH (ref 0.61–1.24)
GFR, Estimated: 50 mL/min — ABNORMAL LOW (ref >60)
Glucose, Bld: 121 mg/dL — ABNORMAL HIGH (ref 70–99)
Potassium: 4.3 mmol/L (ref 3.5–5.1)
Sodium: 137 mmol/L (ref 135–145)

## 2021-02-15 MED ORDER — ONDANSETRON HCL 4 MG PO TABS: 4.0000 mg | ORAL_TABLET | Freq: Four times a day (QID) | ORAL | 0 refills | Status: DC

## 2021-02-15 MED ORDER — HYDRALAZINE HCL 25 MG PO TABS
25.0000 mg | ORAL_TABLET | Freq: Once | ORAL | Status: AC
  Administered 2021-02-15: 25 mg via ORAL
  Filled 2021-02-15: qty 1

## 2021-02-15 MED ORDER — HYDRALAZINE HCL 10 MG PO TABS: 10.0000 mg | ORAL_TABLET | Freq: Three times a day (TID) | ORAL | 0 refills | Status: DC

## 2021-02-15 MED ORDER — SODIUM CHLORIDE 0.9 % IV BOLUS
1000.0000 mL | Freq: Once | INTRAVENOUS | Status: AC
  Administered 2021-02-15: 1000 mL via INTRAVENOUS

## 2021-02-15 MED ORDER — AMLODIPINE BESYLATE 5 MG PO TABS: 10.0000 mg | ORAL_TABLET | Freq: Once | ORAL | Status: DC

## 2021-02-15 NOTE — ED Triage Notes (Addendum)
Pt arrived via walk in, states COVID (+) may 10th. States sx have not resolved. Worsening insomnia and anxiety. States recent changed to anxiety medications 5/11 he believes are causing this. Denies any pain at this time.

## 2021-02-15 NOTE — Discharge Instructions (Addendum)
Call your primary care doctor or specialist as discussed in the next 2-3 days.   Return immediately back to the ER if:  Your symptoms worsen within the next 12-24 hours. You develop new symptoms such as new fevers, persistent vomiting, new pain, shortness of breath, or new weakness or numbness, or if you have any other concerns.  

## 2021-02-15 NOTE — ED Provider Notes (Signed)
[name] DEPT Provider Note   CSN: [id number] Arrival date & time: 02/15/21  0548     History Chief Complaint  Patient presents with  . Covid Positive  . Insomnia    Richard Sellers is a 65 y.o. male.  Patient presenting with many different concerns.  He states that he had COVID May 10 and since then has had persistent generalized malaise generalized fatigue decreased energy and intermittent loose stools.  He states he has 1 loose stool every day or other.  Denies any fevers denies any cough.  Patient states is having a hard time sleeping and has not slept for 3 days.  He has had periods of insomnia in the past and his doctor has given temazepam has not been working well for him yesterday.  He denies headache or chest pain or abdominal pain at this time.        Past Medical History:  Diagnosis Date  . Allergy    seasonal allergies  . Anxiety    on meds  . Chronic kidney disease    hx of kidney stones  . Depression    on meds  . ED (erectile dysfunction)   . GERD (gastroesophageal reflux disease)    not on meds-diet controlled-uses OTC meds for tx  . Heart murmur    at age 27 years old, heard x1 , never again  . History of kidney stones   . Hyperlipidemia    on meds  . Hypertension    on meds  . Hypothyroidism    hx of-was on meds-  . PONV (postoperative nausea and vomiting)   . Warts, genital     Patient Active Problem List   Diagnosis Date Noted  . Depression with anxiety 11/28/2020  . Allergic rhinitis 11/28/2020  . Arthritis 04/06/2020  . Hypogonadism in male 09/01/2018  . Bilateral leg edema 02/17/2017  . Hypothyroidism 11/26/2016  . GERD 05/30/2010  . PSORIASIS 05/30/2010  . Essential hypertension 09/25/2008    Past Surgical History:  Procedure Laterality Date  . APPENDECTOMY  1975  . COLONOSCOPY  11/08/2007   per Dr. Sharlett Iles, diverticulosis but no polyps, repeat in 10 yrs   . CYSTECTOMY    . EXTRACORPOREAL  SHOCK WAVE LITHOTRIPSY Left 10/29/2017   Procedure: LEFT EXTRACORPOREAL SHOCK WAVE LITHOTRIPSY (ESWL);  Surgeon: Franchot Gallo, MD;  Location: [facility];  Service: Urology;  Laterality: Left;  . HERNIA REPAIR  [id number]/6712   umbilical hernia  . KNEE ARTHROSCOPY Right 1993  . TONSILLECTOMY    . WISDOM TOOTH EXTRACTION  1990       Family History  Problem Relation Age of Onset  . Hypertension Other        family hx  . Hyperlipidemia Other        family hx  . Colon polyps Neg Hx   . Colon cancer Neg Hx   . Esophageal cancer Neg Hx   . Stomach cancer Neg Hx   . Rectal cancer Neg Hx     Social History   Tobacco Use  . Smoking status: Former Smoker    Quit date: 09/22/2002    Years since quitting: 18.4  . Smokeless tobacco: Never Used  Vaping Use  . Vaping Use: Every day  Substance Use Topics  . Alcohol use: Yes    Alcohol/week: 28.0 standard drinks    Types: 7 Standard drinks or equivalent, 21 Cans of beer per week    Comment: 2-3 beers per day  .  Drug use: No    Home Medications Prior to Admission medications   Medication Sig Start Date End Date Taking? Authorizing Provider  ALPRAZolam Duanne Moron) 1 MG tablet TAKE ONE TABLET BY MOUTH EVERY 6 HOURS AS NEEDED FOR ANXIETY 08/15/20   Laurey Morale, MD  Cholecalciferol (VITAMIN D) 125 MCG (5000 UT) CAPS Take 1 capsule by mouth once a week. 02/27/20   Laurey Morale, MD  citalopram (CELEXA) 40 MG tablet Take 1 tablet (40 mg total) by mouth daily. 01/30/21   Laurey Morale, MD  diclofenac (VOLTAREN) 75 MG EC tablet Take 1 tablet (75 mg total) by mouth 2 (two) times daily. 04/06/20   Laurey Morale, MD  fexofenadine (ALLEGRA) 60 MG tablet Take 60 mg by mouth 2 (two) times daily. 04/29/20   [provider]  furosemide (LASIX) 20 MG tablet Take 1 tablet (20 mg total) by mouth daily. 07/02/20   Laurey Morale, MD  halobetasol (ULTRAVATE) 0.05 % cream Apply topically 2 (two) times daily. APPLY TOPICALLY TWO TIMES A DAY 03/22/20   Laurey Morale, MD  metoprolol succinate (TOPROL-XL) 100 MG 24 hr tablet Take 1 tablet (100 mg total) by mouth daily. Take with or immediately following a meal. 07/02/20   Laurey Morale, MD  SYRINGE-NEEDLE, DISP, 3 ML (SAFETY SYRINGES/NEEDLE) 20G X 1-1/2" 3 ML MISC 1 application by Does not apply route every 14 (fourteen) days. 11/25/16   Laurey Morale, MD  tadalafil (CIALIS) 20 MG tablet Take 1 tablet (20 mg total) by mouth daily as needed for erectile dysfunction. 03/22/20   Laurey Morale, MD  temazepam (RESTORIL) 15 MG capsule TAKE TWO CAPSULES BY MOUTH EVERY NIGHT AT BEDTIME 07/20/20   Laurey Morale, MD  testosterone cypionate (DEPOTESTOSTERONE CYPIONATE) 200 MG/ML injection INJECT 1 ML INTO THE MUSCLE EVERY 7 DAYS 11/20/20   Laurey Morale, MD    Allergies    Patient has no known allergies.  Review of Systems   Review of Systems  Constitutional: Negative for fever.  HENT: Negative for ear pain and sore throat.   Eyes: Negative for pain.  Respiratory: Negative for cough.   Cardiovascular: Negative for chest pain.  Gastrointestinal: Negative for abdominal pain.  Genitourinary: Negative for flank pain.  Musculoskeletal: Negative for back pain.  Skin: Negative for color change and rash.  Neurological: Negative for syncope.  All other systems reviewed and are negative.   Physical Exam Updated Vital Signs BP (!) 202/132   Pulse 66   Temp 98.3 F (36.8 C)   Resp 19   SpO2 93%   Physical Exam Constitutional:      General: He is not in acute distress.    Appearance: He is well-developed.  HENT:     Head: Normocephalic.     Nose: Nose normal.  Eyes:     Extraocular Movements: Extraocular movements intact.  Cardiovascular:     Rate and Rhythm: Normal rate.  Pulmonary:     Effort: Pulmonary effort is normal.  Skin:    Coloration: Skin is not jaundiced.  Neurological:     Mental Status: He is alert and oriented to person, place, and time. Mental status is at baseline.     Cranial Nerves:  No cranial nerve deficit.     Motor: No weakness.     Gait: Gait normal.     ED Results / Procedures / Treatments   Labs (all labs ordered are listed, but only abnormal results are displayed)  Labs Reviewed  CBC WITH DIFFERENTIAL/PLATELET - Abnormal; Notable for the following components:      Result Value   WBC 10.7 (*)    RBC 6.00 (*)    Hemoglobin 18.3 (*)    HCT 55.4 (*)    All other components within normal limits  BASIC METABOLIC PANEL - Abnormal; Notable for the following components:   Glucose, Bld 121 (*)    BUN 26 (*)    Creatinine, Ser 1.53 (*)    GFR, Estimated 50 (*)    All other components within normal limits    EKG None  Radiology No results found.  Procedures Procedures   Medications Ordered in ED Medications  hydrALAZINE (APRESOLINE) tablet 25 mg (has no administration in time range)  sodium chloride 0.9 % bolus 1,000 mL (1,000 mLs Intravenous New Bag/Given 02/15/21 0809)    ED Course  I have reviewed the triage vital signs and the nursing notes.  Pertinent labs & imaging results that were available during my care of the patient were reviewed by me and considered in my medical decision making (see chart for details).    MDM Rules/Calculators/A&P                          Patient is clinically well-appearing complaining of mostly insomnia and generalized fatigue and concern for ongoing COVID-like symptoms.  Vital signs show hypertension otherwise within normal limits.  Labs show evidence of dehydration and hemoconcentration.  Patient given IV fluid bolus hydration.  Given hydralazine given blood pressure appears significantly elevated.  Advised him to double up on his temazepam to 50 mg nightly, advised him to follow-up with his primary care doctor in 2 or 3 days and to increase his oral intake and hydration.  Advised immediate return for worsening symptoms fevers pain or any additional concerns.  Final Clinical Impression(s) / ED Diagnoses Final  diagnoses:  Insomnia, unspecified type  Hypertension, unspecified type    Rx / DC Orders ED Discharge Orders    None       Luna Fuse, MD 02/15/21 [phone]

## 2021-02-18 ENCOUNTER — Encounter: Payer: Self-pay | Admitting: Family Medicine

## 2021-02-20 NOTE — Telephone Encounter (Signed)
Set up an ED follow up visit

## 2021-02-21 NOTE — Telephone Encounter (Signed)
Spoke with the patient and scheduled an appt on 6/6 to arrive at 3:30pm.

## 2021-02-22 ENCOUNTER — Other Ambulatory Visit: Payer: Self-pay

## 2021-02-22 ENCOUNTER — Encounter: Payer: Self-pay | Admitting: Family Medicine

## 2021-02-22 ENCOUNTER — Other Ambulatory Visit: Payer: Self-pay | Admitting: Family Medicine

## 2021-02-22 MED ORDER — ALPRAZOLAM 1 MG PO TABS: 1.0000 mg | ORAL_TABLET | Freq: Four times a day (QID) | ORAL | 5 refills | Status: DC | PRN

## 2021-02-22 NOTE — Telephone Encounter (Signed)
Done

## 2021-02-22 NOTE — Telephone Encounter (Signed)
My chart message has been sent today requesting refill from patient.  Last refill- 08/15/20-120 tabs with 5 refills  Next appointment is Monday 02/25/21

## 2021-02-25 ENCOUNTER — Encounter: Payer: Self-pay | Admitting: Family Medicine

## 2021-02-25 ENCOUNTER — Ambulatory Visit: Payer: Managed Care, Other (non HMO) | Admitting: Family Medicine

## 2021-02-25 ENCOUNTER — Other Ambulatory Visit: Payer: Self-pay

## 2021-02-25 VITALS — BP 160/120 | HR 67 | Temp 98.5°F | Wt 273.0 lb

## 2021-02-25 DIAGNOSIS — F418 Other specified anxiety disorders: Secondary | ICD-10-CM | POA: Diagnosis not present

## 2021-02-25 DIAGNOSIS — G4700 Insomnia, unspecified: Secondary | ICD-10-CM

## 2021-02-25 DIAGNOSIS — I10 Essential (primary) hypertension: Secondary | ICD-10-CM | POA: Diagnosis not present

## 2021-02-25 MED ORDER — HYDROCHLOROTHIAZIDE 25 MG PO TABS: 25.0000 mg | ORAL_TABLET | Freq: Every morning | ORAL | 3 refills | Status: DC

## 2021-02-25 MED ORDER — CARVEDILOL 12.5 MG PO TABS: 12.5000 mg | ORAL_TABLET | Freq: Two times a day (BID) | ORAL | 3 refills | Status: DC

## 2021-02-25 NOTE — Progress Notes (Signed)
   Subjective:    Patient ID: Richard Sellers, male    DOB: 1956/01/12, 65 y.o.   MRN: [id number]  HPI Here to follow up an ED visit on 02-15-21 for 3 days of insomnia and for very high BP. At the ED his BP was 202/132. His labs were remarkable for a creatinine that had bumped up to 1.53. He denied any SOB or chest pain. He was felt to be dehydrated so he was given IV fluid. For the HTN, he was started on Hydralazine TID until he could see [place]. Since then the BP has come down some but it remains elevated. He feels better, and he has been able to sleep every night.    Review of Systems  Constitutional: Positive for fatigue.  Respiratory: Negative.   Cardiovascular: Negative.   Neurological: Negative.   Psychiatric/Behavioral: Positive for sleep disturbance. Negative for agitation and dysphoric mood. The patient is nervous/anxious.        Objective:   Physical Exam Constitutional:      Appearance: Normal appearance. He is not ill-appearing.  Cardiovascular:     Rate and Rhythm: Normal rate and regular rhythm.     Pulses: Normal pulses.     Heart sounds: Normal heart sounds.  Pulmonary:     Effort: Pulmonary effort is normal.     Breath sounds: Normal breath sounds.  Musculoskeletal:     Right lower leg: No edema.     Left lower leg: No edema.  Neurological:     Mental Status: He is alert and oriented to person, place, and time. Mental status is at baseline.  Psychiatric:        Mood and Affect: Mood normal.        Behavior: Behavior normal.        Thought Content: Thought content normal.        Judgment: Judgment normal.           Assessment & Plan:  I am not sure why he went 3 nights without sleep, but he seems to be back on a normal sleep cycle now. As for the HTN, we will stop Hydralazine and we will stop Metoprolol. Start on Carvedilol 12.5 mg BID and HCTZ 25 mg daily. Recheck in 2 weeks. We spent 35 minutes together reviewing his records and discussing these issues.   Alysia Penna, MD

## 2021-03-11 ENCOUNTER — Ambulatory Visit (HOSPITAL_COMMUNITY): Payer: 59 | Admitting: Licensed Clinical Social Worker

## 2021-03-11 ENCOUNTER — Other Ambulatory Visit: Payer: Self-pay

## 2021-03-11 DIAGNOSIS — F331 Major depressive disorder, recurrent, moderate: Secondary | ICD-10-CM | POA: Diagnosis not present

## 2021-03-11 DIAGNOSIS — F411 Generalized anxiety disorder: Secondary | ICD-10-CM

## 2021-03-11 NOTE — Progress Notes (Signed)
THERAPIST PROGRESS NOTE   Session Time: 1:00pm - 1:30pm   Location: Patient: [facility] Provider: [facility]    Participation Level: Active   Behavioral Response: Alert, casually dressed, depressed mood/affect   Type of Therapy:  Individual Therapy   Treatment Goals addressed: Medication compliance; Anxiety, panic attack, and depression management; Monitoring alcohol use; Following up with medical provider   Interventions: CBT   Summary: Richard Sellers, who prefers to go by "Richard Sellers" is a 65 year old divorced homosexual Caucasian male that presented for therapy session today with diagnoses of Generalized Anxiety Disorder, and Major Depressive Disorder, recurrent, moderate.     Suicidal/Homicidal: None; without plan or intent.   Therapist Response: Clinician met with Richard Sellers for in-person therapy appointment and assessed for safety, medication compliance, and sobriety.  Richard Sellers presented for today's session on time and was alert, oriented x5, with no evidence or self-report of SI/HI or A/V H.  Richard Sellers reported that he continues taking medication as prescribed and limits his consumption of alcohol to x2 beers twice per week.  Clinician inquired about [name]'s emotional ratings today, as well as any significant changes in thoughts, feelings, or behavior since last check-in.  Richard Sellers reported scores of 6/10 for depression, 6/10 for anxiety and 0/10 for anger/irritability.  Richard Sellers reported that he continues to have at least x1 panic attack per day, although this was more frequent over past weeks, as he tested positive for COVID on May 11th or 12th, and struggled with severe symptoms, including nose bleeds, fatigue, congestion, and body aches which led him to go to the ER at one point.  Clinician inquired about whether Richard Sellers had been tested for COVID again since initial test, given ongoing presence of mild to moderate symptoms, and outreached clinic supervisor by phone for guidance on how to approach the  situation. Richard Sellers denied taking any followup tests, so today's appointment was ended after 30 minutes, and clinician requested that he schedule for a new COVID test at location of his choice before another in-person appointment could be scheduled.  Clinician informed Richard Sellers of availability of virtual therapy appointments which could be utilized in the meantime to avoid gap in care.  Clinician recommended that [facility] maintain appointments with PCP regarding management of long COVID symptoms and seek care from ER again if condition worsens.  Richard Sellers was agreeable to recommendations and reported that he would call an urgent care provider near his home today about testing.  He reported that he will also stay in contact with PCP, and avoid pushing himself physically.  Clinician will continue to monitor.       Plan: Follow up again in 3 weeks.   Diagnosis:  Generalized Anxiety Disorder, and Major Depressive Disorder, recurrent, moderate.   Shade Flood, LCSW, LCAS 03/11/21

## 2021-03-15 NOTE — Telephone Encounter (Signed)
Noted  

## 2021-05-16 ENCOUNTER — Other Ambulatory Visit: Payer: Self-pay | Admitting: Family Medicine

## 2021-05-19 ENCOUNTER — Other Ambulatory Visit: Payer: Self-pay | Admitting: Family Medicine

## 2021-05-20 ENCOUNTER — Other Ambulatory Visit: Payer: Self-pay | Admitting: Family Medicine

## 2021-05-22 ENCOUNTER — Other Ambulatory Visit: Payer: Self-pay | Admitting: Family Medicine

## 2021-05-28 ENCOUNTER — Telehealth: Payer: Self-pay | Admitting: Family Medicine

## 2021-05-28 NOTE — Telephone Encounter (Signed)
Patient called wanting a refill on testosterone cypionate (DEPOTESTOSTERONE CYPIONATE) 200 MG/ML injection . Patient states pharmacy spoke with someone on Friday and they stated prescription was filled, but nothing was received on pharmacy's end.    Please Send to  [facility] [id number], [place] Phone:  [phone]  Fax:  [phone]      Good callback number is [phone]    Please Advise

## 2021-05-28 NOTE — Telephone Encounter (Signed)
Please resend prescription to [facility].

## 2021-05-29 MED ORDER — TESTOSTERONE CYPIONATE 200 MG/ML IM SOLN: INTRAMUSCULAR | 1 refills | Status: DC

## 2021-05-29 NOTE — Telephone Encounter (Signed)
Patient notified of update  and verbalized understanding. 

## 2021-05-29 NOTE — Telephone Encounter (Signed)
This was printed on August 26, as the patient requested, but he never picked it up. Nevertheless, I just sent it electronically to [facility]

## 2021-05-30 ENCOUNTER — Ambulatory Visit: Payer: Managed Care, Other (non HMO) | Admitting: Family Medicine

## 2021-06-03 ENCOUNTER — Encounter: Payer: Self-pay | Admitting: Family Medicine

## 2021-06-03 ENCOUNTER — Other Ambulatory Visit: Payer: Self-pay

## 2021-06-03 ENCOUNTER — Ambulatory Visit: Payer: Managed Care, Other (non HMO) | Admitting: Family Medicine

## 2021-06-03 VITALS — BP 146/98 | HR 63 | Temp 98.9°F

## 2021-06-03 DIAGNOSIS — I10 Essential (primary) hypertension: Secondary | ICD-10-CM

## 2021-06-03 DIAGNOSIS — J019 Acute sinusitis, unspecified: Secondary | ICD-10-CM | POA: Diagnosis not present

## 2021-06-03 MED ORDER — LOSARTAN POTASSIUM 50 MG PO TABS: 50.0000 mg | ORAL_TABLET | Freq: Every day | ORAL | 5 refills | Status: DC

## 2021-06-03 MED ORDER — AMOXICILLIN-POT CLAVULANATE 875-125 MG PO TABS: 1.0000 | ORAL_TABLET | Freq: Two times a day (BID) | ORAL | 0 refills | Status: DC

## 2021-06-03 NOTE — Progress Notes (Signed)
   Subjective:    Patient ID: Richard Sellers, male    DOB: [birth year], 65 y.o.   MRN: [id number]  HPI Here to follow up an urgent care visit on 05-23-21 and to discuss his BP. For the past 2 weeks he has had sinus congestion, PND, and chest congestion. Some dry coughing, but no SOB or fever. At the urgent care, he was diagnosed with a viral infection and he was told to take OTC decongestants. However he is still not better. Also his BP at home has remained a bit elevated in the 140s over 90s.    Review of Systems  Constitutional: Negative.   HENT:  Positive for congestion, postnasal drip and sinus pressure. Negative for sore throat.   Eyes: Negative.   Respiratory:  Positive for cough. Negative for shortness of breath and wheezing.   Cardiovascular: Negative.       Objective:   Physical Exam Constitutional:      Appearance: Richard appearance. He is not ill-appearing.  HENT:     Right Ear: Tympanic membrane, ear canal and external ear Richard.     Left Ear: Tympanic membrane, ear canal and external ear Richard.     Nose: Nose Richard.     Mouth/Throat:     Pharynx: Oropharynx is clear.  Eyes:     Conjunctiva/sclera: Conjunctivae Richard.  Cardiovascular:     Rate and Rhythm: Richard rate and regular rhythm.     Pulses: Richard pulses.     Heart sounds: Richard heart sounds.  Pulmonary:     Effort: Pulmonary effort is Richard.     Breath sounds: Richard breath sounds.  Lymphadenopathy:     Cervical: No cervical adenopathy.  Neurological:     Mental Status: He is alert.          Assessment & Plan:  He has a sinusitis, so we will treat this with 10 days of Augmentin. For the HTN, we will add Losartan 50 mg daily to his regimen. Recheck in 3-4 weeks. We spent 35 minutes reviewing records and discussing these issues.  Alysia Penna, MD

## 2021-06-22 ENCOUNTER — Other Ambulatory Visit: Payer: Self-pay | Admitting: Family Medicine

## 2021-06-23 ENCOUNTER — Other Ambulatory Visit: Payer: Self-pay | Admitting: Family Medicine

## 2021-07-10 ENCOUNTER — Other Ambulatory Visit: Payer: Self-pay

## 2021-07-10 ENCOUNTER — Ambulatory Visit: Payer: Managed Care, Other (non HMO) | Admitting: Family Medicine

## 2021-07-10 ENCOUNTER — Encounter: Payer: Self-pay | Admitting: Family Medicine

## 2021-07-10 VITALS — BP 142/108 | HR 92 | Temp 98.8°F

## 2021-07-10 DIAGNOSIS — I10 Essential (primary) hypertension: Secondary | ICD-10-CM | POA: Diagnosis not present

## 2021-07-10 DIAGNOSIS — K219 Gastro-esophageal reflux disease without esophagitis: Secondary | ICD-10-CM | POA: Diagnosis not present

## 2021-07-10 DIAGNOSIS — R21 Rash and other nonspecific skin eruption: Secondary | ICD-10-CM

## 2021-07-10 MED ORDER — AMLODIPINE BESYLATE 5 MG PO TABS: 5.0000 mg | ORAL_TABLET | Freq: Every day | ORAL | 2 refills | Status: DC

## 2021-07-10 MED ORDER — LOSARTAN POTASSIUM-HCTZ 100-25 MG PO TABS
1.0000 | ORAL_TABLET | Freq: Every day | ORAL | 2 refills | Status: DC
Start: 2021-07-10 — End: 2021-10-07

## 2021-07-10 MED ORDER — OMEPRAZOLE 40 MG PO CPDR
40.0000 mg | DELAYED_RELEASE_CAPSULE | Freq: Every day | ORAL | 3 refills | Status: DC
Start: 2021-07-10 — End: 2021-11-07

## 2021-07-10 NOTE — Progress Notes (Signed)
   Subjective:    Patient ID: Richard Sellers, male    DOB: July 31, 1956, 65 y.o.   MRN: [id number]  HPI Here for 3 issues. First his BP has stayed quite high since we saw him, usually in the 140-160/90-100 range. Also he has been having more heartburn lately and also upper abdominal pains when he eats. No trouble swallowing. He has nausea at times but he has not vomited. Third he has had a rash on his face for about a year. Over the pat month is has been spreading over both cheeks.    Review of Systems  Constitutional: Negative.   Respiratory: Negative.    Cardiovascular:  Positive for chest pain. Negative for palpitations and leg swelling.  Gastrointestinal:  Positive for abdominal pain and nausea. Negative for abdominal distention, anal bleeding, blood in stool, constipation, diarrhea and vomiting.  Skin:  Positive for rash.  Neurological: Negative.       Objective:   Physical Exam Constitutional:      Appearance: Normal appearance. He is not ill-appearing.  Cardiovascular:     Rate and Rhythm: Normal rate and regular rhythm.     Pulses: Normal pulses.     Heart sounds: Normal heart sounds.  Pulmonary:     Effort: Pulmonary effort is normal.     Breath sounds: Normal breath sounds.  Abdominal:     General: Abdomen is flat. Bowel sounds are normal. There is no distension.     Palpations: Abdomen is soft. There is no mass.     Tenderness: There is no abdominal tenderness. There is no guarding or rebound.     Hernia: No hernia is present.  Skin:    Comments: There are multiple flat papular lesions on both temples and both cheeks   Neurological:     General: No focal deficit present.     Mental Status: He is alert and oriented to person, place, and time.  Psychiatric:     Comments: He is quite anxious           Assessment & Plan:  For the HTN, he will stay on Carvedilol. We will stop Losartan 50 mg and HCTZ 25 mg and begin Losartan 100-25 daily. We will also add  Amlodipine 5 mg daily. Finally we will refer to Dermatology for the facial rash. We spent a total of ( 34  ) minutes reviewing records and discussing these issues.  Alysia Penna, MD

## 2021-07-12 ENCOUNTER — Telehealth: Payer: Self-pay | Admitting: Family Medicine

## 2021-07-12 NOTE — Telephone Encounter (Signed)
Pt is calling and seen dr fry on 07-10-2021 and was given a note of how to take bp med. Pt lost the note and would like a callback to discuss how to take bp med also pt would like to know if he should take both bp med. Please advise

## 2021-07-15 NOTE — Telephone Encounter (Signed)
Called spoke with patient about b/p medications clarification.    Patient voiced understanding, nothing is needed

## 2021-08-06 ENCOUNTER — Encounter: Payer: Self-pay | Admitting: Family Medicine

## 2021-08-06 ENCOUNTER — Other Ambulatory Visit: Payer: Self-pay | Admitting: Family Medicine

## 2021-08-06 NOTE — Telephone Encounter (Signed)
Pt LOV was 07/10/2021 Last refill for Testosterone was 05/29/2021 Refill for  Xanax was done on 02/22/2021 Please advise

## 2021-08-07 ENCOUNTER — Encounter: Payer: Self-pay | Admitting: Family Medicine

## 2021-08-07 NOTE — Telephone Encounter (Signed)
Pt requests for refill on Xanax Pt LOV was on 07/10/2021 Last refill was done on 02/22/2021 Please advise

## 2021-08-08 MED ORDER — ALPRAZOLAM 1 MG PO TABS: 1.0000 mg | ORAL_TABLET | Freq: Four times a day (QID) | ORAL | 5 refills | Status: DC | PRN

## 2021-08-08 MED ORDER — TESTOSTERONE CYPIONATE 200 MG/ML IM SOLN: INTRAMUSCULAR | 5 refills | Status: DC

## 2021-08-08 NOTE — Telephone Encounter (Signed)
Done

## 2021-08-30 ENCOUNTER — Ambulatory Visit (INDEPENDENT_AMBULATORY_CARE_PROVIDER_SITE_OTHER): Payer: Managed Care, Other (non HMO) | Admitting: Family Medicine

## 2021-08-30 ENCOUNTER — Encounter: Payer: Self-pay | Admitting: Family Medicine

## 2021-08-30 VITALS — BP 132/80 | HR 84 | Temp 98.8°F

## 2021-08-30 DIAGNOSIS — M5442 Lumbago with sciatica, left side: Secondary | ICD-10-CM | POA: Diagnosis not present

## 2021-08-30 DIAGNOSIS — G8929 Other chronic pain: Secondary | ICD-10-CM | POA: Diagnosis not present

## 2021-08-30 MED ORDER — GABAPENTIN 300 MG PO CAPS: 300.0000 mg | ORAL_CAPSULE | Freq: Three times a day (TID) | ORAL | 3 refills | Status: DC

## 2021-08-30 NOTE — Progress Notes (Signed)
   Subjective:    Patient ID: Richard Sellers, male    DOB: 12/24/1955, 65 y.o.   MRN: [id number]  HPI Here for dull pain in the left lower back and a burning pain in the left lateral thigh. This started one year ago but it was infrequent for months. However in the last 2 weeks it has been occurring every day. It only hurts when he is on his feet, either standing or walking, for periods of 30 minutes or more. When this occurs he simply sits down and it promptly goes away. He has been taking Ibuprofen with poor results. He went to an urgent care last weekend and they did an Xray of the lumbar spine. He was told he had a narrowed disc space just above the tailbone. They gave him a shot of Toradol and a steroid taper pack. They also started him on Gabapentin 300 mg TID.    Review of Systems  Constitutional: Negative.   Respiratory: Negative.    Cardiovascular: Negative.   Musculoskeletal:  Positive for back pain.      Objective:   Physical Exam Constitutional:      Appearance: Normal appearance.  Cardiovascular:     Rate and Rhythm: Normal rate and regular rhythm.     Pulses: Normal pulses.     Heart sounds: Normal heart sounds.  Pulmonary:     Effort: Pulmonary effort is normal.     Breath sounds: Normal breath sounds.  Musculoskeletal:     Comments: Lower back is not tender, full ROM   Neurological:     Mental Status: He is alert.          Assessment & Plan:  Low back pain with sciatica. We will set him up for am MRI of the lumbar spine. He will continue on Gabapentin and Ibuprofen. Written out of work tomorrow.  Alysia Penna, MD

## 2021-09-02 ENCOUNTER — Ambulatory Visit (HOSPITAL_COMMUNITY): Payer: 59 | Admitting: Licensed Clinical Social Worker

## 2021-09-17 ENCOUNTER — Other Ambulatory Visit: Payer: Self-pay

## 2021-09-17 ENCOUNTER — Encounter: Payer: Self-pay | Admitting: Family Medicine

## 2021-09-17 ENCOUNTER — Telehealth (INDEPENDENT_AMBULATORY_CARE_PROVIDER_SITE_OTHER): Payer: Managed Care, Other (non HMO) | Admitting: Family Medicine

## 2021-09-17 ENCOUNTER — Telehealth: Payer: Self-pay | Admitting: Family Medicine

## 2021-09-17 DIAGNOSIS — J0191 Acute recurrent sinusitis, unspecified: Secondary | ICD-10-CM | POA: Diagnosis not present

## 2021-09-17 MED ORDER — AMOXICILLIN-POT CLAVULANATE 875-125 MG PO TABS: 1.0000 | ORAL_TABLET | Freq: Two times a day (BID) | ORAL | 0 refills | Status: DC

## 2021-09-17 NOTE — Telephone Encounter (Signed)
Patient calling in with respiratory symptoms: Shortness of breath, chest pain, palpitations or other red words send to Triage  Does the patient have a fever over 100, cough, congestion, sore throat, runny nose, lost of taste/smell (please list symptoms that patient has)? Cough with phlegm, dry throat, stuffy head for about 4 days  What date did symptoms start?09-13-2021 (If over 5 days ago, pt may be scheduled for in person visit)  Have you tested for Covid in the last 5 days? No   If yes, was it positive []  OR negative [] ? If positive in the last 5 days, please schedule virtual visit now. If negative, schedule for an in person OV with the next available provider if PCP has no openings. Please also let patient know they will be tested again (follow the script below)  "you will have to arrive 77mins prior to your appt time to be Covid tested. Please park in back of office at the [facility] & call [phone] to let the staff know you have arrived. A staff member will meet you at your car to do a rapid covid test. Once the test has resulted you will be notified by phone of your results to determine if appt will remain an in person visit or be converted to a virtual/phone visit. If you arrive less than 54mins before your appt time, your visit will be automatically converted to virtual & any recommended testing will happen AFTER the visit."  Pt has virtual with dr fry 09-17-2021 at 1 pm  THINGS TO REMEMBER  If no availability for virtual visit in office,  please schedule another [place] office  If no availability at another [place] office, please instruct patient that they can schedule an evisit or virtual visit through their mychart account. Visits up to 8pm  patients can be seen in office 5 days after positive COVID test

## 2021-09-17 NOTE — Progress Notes (Signed)
° °  Subjective:    Patient ID: Richard Sellers, male    DOB: 1956-09-01, 65 y.o.   MRN: [id number]  HPI Virtual Visit via Telephone Note  I connected with the patient on 09/17/21 at  1:00 PM EST by telephone and verified that I am speaking with the correct person using two identifiers.   I discussed the limitations, risks, security and privacy concerns of performing an evaluation and management service by telephone and the availability of in person appointments. I also discussed with the patient that there may be a patient responsible charge related to this service. The patient expressed understanding and agreed to proceed.  Location patient: home Location provider: work or home office Participants present for the call: patient, provider Patient did not have a visit in the prior 7 days to address this/these issue(s).   History of Present Illness: Here for 4 days pf headache, stuffy head, PND, and ST. He is blowing green mucus from the nose.    Observations/Objective: Patient sounds cheerful and well on the phone. I do not appreciate any SOB. Speech and thought processing are grossly intact. Patient reported vitals:  Assessment and Plan: Sinusitis, treat with Augmentin. Alysia Penna, MD   Follow Up Instructions:     [phone] 5-10 [phone] 11-20 [phone] 21-30 I did not refer this patient for an OV in the next 24 hours for this/these issue(s).  I discussed the assessment and treatment plan with the patient. The patient was provided an opportunity to ask questions and all were answered. The patient agreed with the plan and demonstrated an understanding of the instructions.   The patient was advised to call back or seek an in-person evaluation if the symptoms worsen or if the condition fails to improve as anticipated.  I provided 13 minutes of non-face-to-face time during this encounter.   Alysia Penna, MD     Review of Systems     Objective:   Physical Exam         Assessment & Plan:

## 2021-10-04 ENCOUNTER — Other Ambulatory Visit: Payer: Self-pay | Admitting: Family Medicine

## 2021-10-05 ENCOUNTER — Other Ambulatory Visit: Payer: Self-pay | Admitting: Family Medicine

## 2021-10-09 ENCOUNTER — Telehealth (INDEPENDENT_AMBULATORY_CARE_PROVIDER_SITE_OTHER): Payer: Medicare Other | Admitting: Family Medicine

## 2021-10-09 ENCOUNTER — Encounter: Payer: Self-pay | Admitting: Family Medicine

## 2021-10-09 DIAGNOSIS — J069 Acute upper respiratory infection, unspecified: Secondary | ICD-10-CM

## 2021-10-09 NOTE — Progress Notes (Signed)
° °  Subjective:    Patient ID: Richard Sellers, male    DOB: July 21, 1956, 66 y.o.   MRN: [id number]  HPI Virtual Visit via Telephone Note  I connected with the patient on 10/09/21 at  8:15 AM EST by telephone and verified that I am speaking with the correct person using two identifiers.   I discussed the limitations, risks, security and privacy concerns of performing an evaluation and management service by telephone and the availability of in person appointments. I also discussed with the patient that there may be a patient responsible charge related to this service. The patient expressed understanding and agreed to proceed.  Location patient: home Location provider: work or home office Participants present for the call: patient, provider Patient did not have a visit in the prior 7 days to address this/these issue(s).   History of Present Illness: Here for 2 days of stuffy head, PND, and sneezing. No cough or fever or body aches. We saw him on 09-17-21 for a sinus infection and we treated this with Augmentin. He says this totally resolved.    Observations/Objective: Patient sounds cheerful and well on the phone. I do not appreciate any SOB. Speech and thought processing are grossly intact. Patient reported vitals:  Assessment and Plan: It sounds like he has a mild viral URI now. He can drink fluids and use Mucinex and Flonase as needed. I suggested he test himself for the Covid -19 virus just to be sure.  Alysia Penna, MD   Follow Up Instructions:     [phone] 5-10 [phone] 11-20 [phone] 21-30 I did not refer this patient for an OV in the next 24 hours for this/these issue(s).  I discussed the assessment and treatment plan with the patient. The patient was provided an opportunity to ask questions and all were answered. The patient agreed with the plan and demonstrated an understanding of the instructions.   The patient was advised to call back or seek an in-person evaluation if the  symptoms worsen or if the condition fails to improve as anticipated.  I provided 12 mminutes of non-face-to-face time during this encounter.   Alysia Penna, MD     Review of Systems     Objective:   Physical Exam        Assessment & Plan:

## 2021-10-10 ENCOUNTER — Ambulatory Visit
Admission: RE | Admit: 2021-10-10 | Discharge: 2021-10-10 | Disposition: A | Discharge status: Home / Self Care | Payer: Medicare Other | Source: Ambulatory Visit | Attending: Family Medicine | Admitting: Family Medicine

## 2021-10-10 DIAGNOSIS — M5416 Radiculopathy, lumbar region: Secondary | ICD-10-CM | POA: Diagnosis not present

## 2021-10-10 DIAGNOSIS — G8929 Other chronic pain: Secondary | ICD-10-CM

## 2021-10-10 DIAGNOSIS — M5136 Other intervertebral disc degeneration, lumbar region: Secondary | ICD-10-CM | POA: Diagnosis not present

## 2021-10-10 DIAGNOSIS — M5442 Lumbago with sciatica, left side: Secondary | ICD-10-CM

## 2021-10-11 ENCOUNTER — Encounter: Payer: Self-pay | Admitting: Family Medicine

## 2021-10-18 NOTE — Addendum Note (Signed)
Addended by: Alysia Penna A on: 10/18/2021 01:36 PM   Modules accepted: Orders

## 2021-11-07 ENCOUNTER — Other Ambulatory Visit: Payer: Self-pay | Admitting: Family Medicine

## 2021-11-07 DIAGNOSIS — K219 Gastro-esophageal reflux disease without esophagitis: Secondary | ICD-10-CM

## 2021-11-13 ENCOUNTER — Telehealth (INDEPENDENT_AMBULATORY_CARE_PROVIDER_SITE_OTHER): Payer: Medicare Other | Admitting: Family Medicine

## 2021-11-13 ENCOUNTER — Encounter: Payer: Self-pay | Admitting: Family Medicine

## 2021-11-13 DIAGNOSIS — M722 Plantar fascial fibromatosis: Secondary | ICD-10-CM | POA: Diagnosis not present

## 2021-11-13 MED ORDER — DICLOFENAC SODIUM 75 MG PO TBEC: 75.0000 mg | DELAYED_RELEASE_TABLET | Freq: Two times a day (BID) | ORAL | 5 refills | Status: DC

## 2021-11-13 NOTE — Progress Notes (Signed)
Subjective:    Patient ID: Richard Sellers, male    DOB: 24-Jan-1956, 66 y.o.   MRN: [id number]  HPI Virtual Visit via Video Note  I connected with the patient on 11/13/21 at  9:00 AM EST by a video enabled telemedicine application and verified that I am speaking with the correct person using two identifiers.  Location patient: home Location provider:work or home office Persons participating in the virtual visit: patient, provider  I discussed the limitations of evaluation and management by telemedicine and the availability of in person appointments. The patient expressed understanding and agreed to proceed.   HPI: H for 3 weeks of pain on the bottom of the left heel and in the left arch. No swelling. No recent trauma. Of note he is starting PT later today for the low back pain we have been seeing him for. The heel only hurts when he walks on it.    ROS: See pertinent positives and negatives per HPI.  Past Medical History:  Diagnosis Date   Allergy    seasonal allergies   Anxiety    on meds   Chronic kidney disease    hx of kidney stones   Depression    on meds   ED (erectile dysfunction)    GERD (gastroesophageal reflux disease)    not on meds-diet controlled-uses OTC meds for tx   Heart murmur    at age 16 years old, heard x1 , never again   History of kidney stones    Hyperlipidemia    on meds   Hypertension    on meds   Hypothyroidism    hx of-was on meds-   PONV (postoperative nausea and vomiting)    Warts, genital     Past Surgical History:  Procedure Laterality Date   APPENDECTOMY  1975   COLONOSCOPY  11/08/2007   per Dr. Sharlett Iles, diverticulosis but no polyps, repeat in 10 yrs    [place] LITHOTRIPSY Left 10/29/2017   Procedure: LEFT EXTRACORPOREAL SHOCK WAVE LITHOTRIPSY (ESWL);  Surgeon: Franchot Gallo, MD;  Location: [facility];  Service: Urology;  Laterality: Left;   HERNIA REPAIR  71/2458   umbilical hernia   KNEE  ARTHROSCOPY Right 1993   TONSILLECTOMY     WISDOM TOOTH EXTRACTION  1990    Family History  Problem Relation Age of Onset   Hypertension Other        family hx   Hyperlipidemia Other        family hx   Colon polyps Neg Hx    Colon cancer Neg Hx    Esophageal cancer Neg Hx    Stomach cancer Neg Hx    Rectal cancer Neg Hx      Current Outpatient Medications:    ALPRAZolam (XANAX) 1 MG tablet, Take 1 tablet (1 mg total) by mouth every 6 (six) hours as needed. for anxiety, Disp: 120 tablet, Rfl: 5   amLODipine (NORVASC) 5 MG tablet, TAKE ONE TABLET BY MOUTH DAILY, Disp: 30 tablet, Rfl: 2   carvedilol (COREG) 12.5 MG tablet, TAKE ONE TABLET BY MOUTH TWICE A DAY WITH MEALS, Disp: 60 tablet, Rfl: 2   Cholecalciferol (VITAMIN D) 125 MCG (5000 UT) CAPS, Take 1 capsule by mouth once a week., Disp: 12 capsule, Rfl: 3   fexofenadine (ALLEGRA) 60 MG tablet, Take 60 mg by mouth 2 (two) times daily., Disp: , Rfl:    gabapentin (NEURONTIN) 300 MG capsule, Take 1 capsule (300  mg total) by mouth 3 (three) times daily., Disp: 90 capsule, Rfl: 3   halobetasol (ULTRAVATE) 0.05 % cream, Apply topically 2 (two) times daily. APPLY TOPICALLY TWO TIMES A DAY, Disp: 50 g, Rfl: 5   losartan-hydrochlorothiazide (HYZAAR) 100-25 MG tablet, TAKE ONE TABLET BY MOUTH DAILY, Disp: 30 tablet, Rfl: 2   omeprazole (PRILOSEC) 40 MG capsule, TAKE ONE CAPSULE BY MOUTH DAILY, Disp: 30 capsule, Rfl: 2   ondansetron (ZOFRAN) 4 MG tablet, Take 1 tablet (4 mg total) by mouth every 6 (six) hours., Disp: 12 tablet, Rfl: 0   SYRINGE-NEEDLE, DISP, 3 ML (SAFETY SYRINGES/NEEDLE) 20G X 1-1/2" 3 ML MISC, 1 application by Does not apply route every 14 (fourteen) days., Disp: 50 each, Rfl: 2   tadalafil (CIALIS) 20 MG tablet, Take 1 tablet (20 mg total) by mouth daily as needed for erectile dysfunction., Disp: 10 tablet, Rfl: 11   temazepam (RESTORIL) 15 MG capsule, TAKE TWO CAPSULES BY MOUTH EVERY NIGHT AT BEDTIME, Disp: 60 capsule, Rfl:  5   testosterone cypionate (DEPOTESTOSTERONE CYPIONATE) 200 MG/ML injection, INJECT 1ML INTO THE MUSCLE EVERY 7 DAYS, Disp: 4 mL, Rfl: 5   diclofenac (VOLTAREN) 75 MG EC tablet, Take 1 tablet (75 mg total) by mouth 2 (two) times daily., Disp: 60 tablet, Rfl: 5  EXAM:  VITALS per patient if applicable:  GENERAL: alert, oriented, appears well and in no acute distress  HEENT: atraumatic, conjunttiva clear, no obvious abnormalities on inspection of external nose and ears  NECK: normal movements of the head and neck  LUNGS: on inspection no signs of respiratory distress, breathing rate appears normal, no obvious gross SOB, gasping or wheezing  CV: no obvious cyanosis  MS: moves all visible extremities without noticeable abnormality  PSYCH/NEURO: pleasant and cooperative, no obvious depression or anxiety, speech and thought processing grossly intact  ASSESSMENT AND PLAN: Plantar fasciitis. He will wear gel arch supports with heel cups in his shoes and he will avoid going barefoot. Take Diclofenac BID. Apply ice to the heel TID. Recheck as needed.  Alysia Penna, MD  Discussed the following assessment and plan:  No diagnosis found.     I discussed the assessment and treatment plan with the patient. The patient was provided an opportunity to ask questions and all were answered. The patient agreed with the plan and demonstrated an understanding of the instructions.   The patient was advised to call back or seek an in-person evaluation if the symptoms worsen or if the condition fails to improve as anticipated.      Review of Systems     Objective:   Physical Exam        Assessment & Plan:

## 2021-11-21 NOTE — Therapy (Incomplete)
?OUTPATIENT PHYSICAL THERAPY THORACOLUMBAR EVALUATION ? ? ?Patient Name: Richard Sellers ?MRN: [id number] ?DOB:03-Jul-1956, 66 y.o., male ?Today's Date: 11/21/2021 ? ? ? ?Past Medical History:  ?Diagnosis Date  ? Allergy   ? seasonal allergies  ? Anxiety   ? on meds  ? Chronic kidney disease   ? hx of kidney stones  ? Depression   ? on meds  ? ED (erectile dysfunction)   ? GERD (gastroesophageal reflux disease)   ? not on meds-diet controlled-uses OTC meds for tx  ? Heart murmur   ? at age 63 years old, heard x1 , never again  ? History of kidney stones   ? Hyperlipidemia   ? on meds  ? Hypertension   ? on meds  ? Hypothyroidism   ? hx of-was on meds-  ? PONV (postoperative nausea and vomiting)   ? Warts, genital   ? ?Past Surgical History:  ?Procedure Laterality Date  ? APPENDECTOMY  1975  ? COLONOSCOPY  11/08/2007  ? per Dr. Sharlett Iles, diverticulosis but no polyps, repeat in 10 yrs   ? CYSTECTOMY    ? EXTRACORPOREAL SHOCK WAVE LITHOTRIPSY Left 10/29/2017  ? Procedure: LEFT EXTRACORPOREAL SHOCK WAVE LITHOTRIPSY (ESWL);  Surgeon: Franchot Gallo, MD;  Location: [facility];  Service: Urology;  Laterality: Left;  ? HERNIA REPAIR  88/4166  ? umbilical hernia  ? KNEE ARTHROSCOPY Right 1993  ? TONSILLECTOMY    ? [place] EXTRACTION  1990  ? ?Patient Active Problem List  ? Diagnosis Date Noted  ? Depression with anxiety 11/28/2020  ? Allergic rhinitis 11/28/2020  ? Arthritis 04/06/2020  ? Hypogonadism in male 09/01/2018  ? Bilateral leg edema 02/17/2017  ? Hypothyroidism 11/26/2016  ? GERD 05/30/2010  ? PSORIASIS 05/30/2010  ? Essential hypertension 09/25/2008  ? ? ?PCP: Laurey Morale, MD ? ?REFERRING PROVIDER: Laurey Morale, MD ? ?REFERRING DIAG:  ?  ?M54.42,G89.29 (ICD-10-CM) - Chronic left-sided low back pain with left-sided sciatica  ? ?  ?M54.42,G89.29 (ICD-10-CM) - Chronic left-sided low back pain with left-sided sciatica  ? ? ?THERAPY DIAG:  ?No diagnosis found. ? ?ONSET DATE: *** ? ?SUBJECTIVE:                                                                                                                                                                                           ? ?SUBJECTIVE STATEMENT: ?*** ?PERTINENT HISTORY:  ?Anxiety, HTN ? ?PAIN:  ?Are you having pain? {yes/no:20286} ?NPRS scale: ***/10 ?Pain location: *** ?Pain orientation: {Pain Orientation:25161}  ?PAIN TYPE: {type:313116} ?Pain description: {PAIN DESCRIPTION:21022940}  ?Aggravating factors: *** ?Relieving factors: *** ? ?PRECAUTIONS: None ? ?WEIGHT  BEARING RESTRICTIONS No ? ?FALLS:  ?Has patient fallen in last 6 months? {yes/no:20286}, Number of falls: *** ? ?LIVING ENVIRONMENT: ?Lives with: {[facility] lives with:25569::"lives with their family"} ?Lives in: {Lives in:25570} ?Stairs: {yes/no:20286}; {Stairs:24000} ?Has following equipment at home: {Assistive devices:23999} ? ?OCCUPATION: *** ? ?PLOF: {PLOF:24004} ? ?PATIENT GOALS *** ? ? ?OBJECTIVE:  ? ?DIAGNOSTIC FINDINGS:  ?IMPRESSION: ?1. Mild degenerative changes of the lumbar spine without high-grade ?spinal canal or neural foraminal stenosis. ?2. Epidural lipomatosis along the spinal canal, more pronounced at ?L5-S1 where it causes narrowing of the thecal sac. ? ?PATIENT SURVEYS:  ?FOTO *** ? ?SCREENING FOR RED FLAGS: ?Bowel or bladder incontinence: {Yes/No:304960894} ?Spinal tumors: {Yes/No:304960894} ?Cauda equina syndrome: {Yes/No:304960894} ?Compression fracture: {Yes/No:304960894} ?Abdominal aneurysm: {Yes/No:304960894} ? ?COGNITION: ? Overall cognitive status: Within functional limits for tasks assessed   ?  ?SENSATION: ? Light touch: {intact/deficits:24005} ? Stereognosis: {intact/deficits:24005} ? Hot/Cold: {intact/deficits:24005} ? Proprioception: {intact/deficits:24005} ? ?MUSCLE LENGTH: ?Hamstrings: Right *** deg; Left *** deg ?Thomas test: Right *** deg; Left *** deg ? ?POSTURE:  ?*** ? ?PALPATION: ?*** ? ?LUMBARAROM/PROM ? ?A/PROM A/PROM  ?11/21/2021  ?Flexion   ?Extension   ?Right lateral  flexion   ?Left lateral flexion   ?Right rotation   ?Left rotation   ? (Blank rows = not tested) ? ?LE AROM/PROM: ? ?A/PROM Right ?11/21/2021 Left ?11/21/2021  ?Hip flexion    ?Hip extension    ?Hip abduction    ?Hip adduction    ?Hip internal rotation    ?Hip external rotation    ?Knee flexion    ?Knee extension    ?Ankle dorsiflexion    ?Ankle plantarflexion    ?Ankle inversion    ?Ankle eversion    ? (Blank rows = not tested) ? ?LE MMT: ? ?MMT Right ?11/21/2021 Left ?11/21/2021  ?Hip flexion    ?Hip extension    ?Hip abduction    ?Hip adduction    ?Hip internal rotation    ?Hip external rotation    ?Knee flexion    ?Knee extension    ?Ankle dorsiflexion    ?Ankle plantarflexion    ?Ankle inversion    ?Ankle eversion    ? (Blank rows = not tested) ? ?LUMBAR SPECIAL TESTS:  ?{lumbar special test:25242} ? ?FUNCTIONAL TESTS:  ?{Functional tests:24029} ? ?GAIT: ?Distance walked: *** ?Assistive device utilized: {Assistive devices:23999} ?Level of assistance: {Levels of assistance:24026} ?Comments: *** ? ? ? ?TODAY'S TREATMENT  ?11/25/21: established HEP-see below  ? ? ?PATIENT EDUCATION:  ?Education details: *** ?Person educated: Patient ?Education method: Explanation, Demonstration, and Handouts ?Education comprehension: verbalized understanding and returned demonstration ? ? ?HOME EXERCISE PROGRAM: ?*** ? ?ASSESSMENT: ? ?CLINICAL IMPRESSION: ?Patient is a 66 y.o. male who was seen today for physical therapy evaluation and treatment for LBP. Patient will benefit from skilled PT to address the below impairments and improve overall function.  ? ? ?OBJECTIVE IMPAIRMENTS {opptimpairments:25111}.  ? ?ACTIVITY LIMITATIONS {activity limitations:25113}.  ? ?PERSONAL FACTORS {Personal factors:25162} are also affecting patient's functional outcome.  ? ? ?REHAB POTENTIAL: Good ? ?CLINICAL DECISION MAKING: Stable/uncomplicated ? ?EVALUATION COMPLEXITY: Low ? ? ?GOALS: ?Goals reviewed with patient? Yes ? ?SHORT TERM GOALS: ? ?Be independent  in initial HEP ?Baseline: *** ?Target date: {follow up:25551} ?Goal status: INITIAL ? ?2.  *** ?Baseline: *** ?Target date: {follow up:25551} ?Goal status: {GOALSTATUS:25110} ? ?3.  *** ?Baseline: *** ?Target date: {follow up:25551} ?Goal status: {GOALSTATUS:25110} ? ?4.  *** ?Baseline: *** ?Target date: {follow up:25551} ?Goal status: {GOALSTATUS:25110} ? ?5.  *** ?Baseline: *** ?Target  date: {follow up:25551} ?Goal status: {GOALSTATUS:25110} ? ?6.  *** ?Baseline: *** ?Target date: {follow up:25551} ?Goal status: {GOALSTATUS:25110} ? ?LONG TERM GOALS: ? ?Be independent in advanced HEP ?Baseline: *** ?Target date: {follow up:25551} ?Goal status: INITIAL ? ?2.  Improve FOTO to > or = to  ?Baseline: *** ?Target date: {follow up:25551} ?Goal status: INITIAL ? ?3.  *** ?Baseline: *** ?Target date: {follow up:25551} ?Goal status: {GOALSTATUS:25110} ? ?4.  *** ?Baseline: *** ?Target date: {follow up:25551} ?Goal status: {GOALSTATUS:25110} ? ?5.  *** ?Baseline: *** ?Target date: {follow up:25551} ?Goal status: {GOALSTATUS:25110} ? ?6.  *** ?Baseline: *** ?Target date: {follow up:25551} ?Goal status: {GOALSTATUS:25110} ? ?PLAN: ?PT FREQUENCY: 1-2x/week ? ?PT DURATION: 8 weeks ? ?PLANNED INTERVENTIONS: Therapeutic exercises, Therapeutic activity, Neuromuscular re-education, Balance training, Gait training, Patient/Family education, Joint mobilization, Stair training, Dry Needling, Spinal manipulation, Spinal mobilization, Cryotherapy, Moist heat, Taping, Traction, and Manual therapy ? ?PLAN FOR NEXT SESSION: *** ? ? ?Elizabethann Lackey, PT ?11/21/2021, 4:20 PM  ?

## 2021-11-25 ENCOUNTER — Ambulatory Visit: Payer: Medicare Other

## 2021-11-25 ENCOUNTER — Encounter: Payer: Self-pay | Admitting: Family Medicine

## 2021-11-25 ENCOUNTER — Ambulatory Visit (INDEPENDENT_AMBULATORY_CARE_PROVIDER_SITE_OTHER): Payer: Medicare Other | Admitting: Family Medicine

## 2021-11-25 ENCOUNTER — Telehealth: Payer: Self-pay | Admitting: Family Medicine

## 2021-11-25 VITALS — BP 110/86 | HR 68 | Temp 99.3°F

## 2021-11-25 DIAGNOSIS — R0981 Nasal congestion: Secondary | ICD-10-CM

## 2021-11-25 DIAGNOSIS — R519 Headache, unspecified: Secondary | ICD-10-CM

## 2021-11-25 DIAGNOSIS — J029 Acute pharyngitis, unspecified: Secondary | ICD-10-CM | POA: Diagnosis not present

## 2021-11-25 DIAGNOSIS — J020 Streptococcal pharyngitis: Secondary | ICD-10-CM

## 2021-11-25 LAB — POC COVID19 BINAXNOW: SARS Coronavirus 2 Ag: NEGATIVE

## 2021-11-25 LAB — POCT INFLUENZA A/B
Influenza A, POC: NEGATIVE
Influenza B, POC: NEGATIVE

## 2021-11-25 LAB — POCT RAPID STREP A (OFFICE): Rapid Strep A Screen: POSITIVE — AB

## 2021-11-25 MED ORDER — CEFDINIR 300 MG PO CAPS: 300.0000 mg | ORAL_CAPSULE | Freq: Two times a day (BID) | ORAL | 0 refills | Status: DC

## 2021-11-25 NOTE — Telephone Encounter (Signed)
Patient called in stating due to pollen that his sinuses are going crazy and would like to see Dr Sarajane Jews today to see if he can give him something for relief,.  ?

## 2021-11-25 NOTE — Progress Notes (Signed)
? ?  Subjective:  ? ? Patient ID: Richard Sellers, male    DOB: 10/31/1955, 66 y.o.   MRN: [id number] ? ?HPI ?Here for 5 days of fever, headache, and a ST. No coughing or body aches. No NVD. He is drinking fluids and taking Tylenol.  ? ? ?Review of Systems  ?Constitutional:  Positive for fever.  ?HENT:  Positive for sore throat. Negative for congestion and ear pain.   ?Eyes: Negative.   ?Respiratory: Negative.    ?Gastrointestinal: Negative.   ? ?   ?Objective:  ? Physical Exam ?Constitutional:   ?   Appearance: He is not ill-appearing.  ?HENT:  ?   Right Ear: Tympanic membrane, ear canal and external ear normal.  ?   Left Ear: Tympanic membrane, ear canal and external ear normal.  ?   Nose: Nose normal.  ?   Mouth/Throat:  ?   Comments: Posterior OP is red without exudate  ?Eyes:  ?   Conjunctiva/sclera: Conjunctivae normal.  ?Pulmonary:  ?   Effort: Pulmonary effort is normal.  ?   Breath sounds: Normal breath sounds.  ?Lymphadenopathy:  ?   Cervical: No cervical adenopathy.  ?Neurological:  ?   Mental Status: He is alert.  ? ? ? ? ? ?   ?Assessment & Plan:  ?Strep pharyngitis, treat with 10 days of Cefdinir.  ?Alysia Penna, MD ? ? ?

## 2021-11-25 NOTE — Telephone Encounter (Signed)
--  Richard Sellers states he developed sore throat, headache, sinus ?congestion with wheezing about 4 days ago. No severe ?breathing difficulty or blueness around his lips. No ?chest pain. No known fever. Alert and responsive. ?(possible COVID concerns due to symptoms) ? ? ?11/25/2021 8:03:52 AM Call PCP within [place], RN, [place] ? ?Referrals ?REFERRED TO PCP OFFICE ? ?11/25/21 1208: States sore throat started intermittently x 4 days ago. Then symptoms started afterwards. Pt has he was given medication for congestion by pharmacists that HBP, but it has not been effective. Pt has not tested for flu or covid. Pt instructed that he will be tested prior to his appt; to arrive at 2:45 & [place]; given number to call to alert [place] that he is here since he will not be getting out of the car without instruction. Pt verb understanding. ? ? ?

## 2021-11-25 NOTE — Telephone Encounter (Signed)
Pt was seen by Dr Sarajane Jews at the office this afternoon ?

## 2021-11-28 ENCOUNTER — Ambulatory Visit: Payer: Medicare Other | Admitting: Physical Therapy

## 2021-12-12 ENCOUNTER — Ambulatory Visit: Payer: Medicare Other | Admitting: Physical Therapy

## 2022-01-05 ENCOUNTER — Other Ambulatory Visit: Payer: Self-pay | Admitting: Family Medicine

## 2022-01-06 ENCOUNTER — Other Ambulatory Visit: Payer: Self-pay | Admitting: Family Medicine

## 2022-01-24 ENCOUNTER — Encounter: Payer: Self-pay | Admitting: Family Medicine

## 2022-01-24 ENCOUNTER — Other Ambulatory Visit: Payer: Self-pay | Admitting: Family Medicine

## 2022-01-24 MED ORDER — ALPRAZOLAM 1 MG PO TABS: 1.0000 mg | ORAL_TABLET | Freq: Four times a day (QID) | ORAL | 5 refills | Status: DC | PRN

## 2022-01-24 NOTE — Telephone Encounter (Signed)
Pt calling regarding this refill.( ALPRAZolam (XANAX) 1 MG tablet) Feels as if he is having a panic attack because he knows he is on his last pill. Pls call patient with update at [phone] ?

## 2022-01-24 NOTE — Telephone Encounter (Signed)
Last OV- 11/25/2021 ?No future OV scheduled.  ?

## 2022-01-24 NOTE — Telephone Encounter (Signed)
Pt LOV  was on 11/25/2021 ?Last refill was done on 08/08/2021 ?Please advise ?

## 2022-01-24 NOTE — Telephone Encounter (Signed)
Done

## 2022-02-06 ENCOUNTER — Other Ambulatory Visit: Payer: Self-pay | Admitting: Family Medicine

## 2022-02-06 DIAGNOSIS — K219 Gastro-esophageal reflux disease without esophagitis: Secondary | ICD-10-CM

## 2022-02-18 ENCOUNTER — Encounter: Payer: Self-pay | Admitting: Family Medicine

## 2022-02-18 ENCOUNTER — Ambulatory Visit (INDEPENDENT_AMBULATORY_CARE_PROVIDER_SITE_OTHER): Payer: Medicare Other | Admitting: Family Medicine

## 2022-02-18 VITALS — BP 104/78 | HR 66 | Temp 98.7°F | Ht 68.0 in

## 2022-02-18 DIAGNOSIS — J029 Acute pharyngitis, unspecified: Secondary | ICD-10-CM

## 2022-02-18 DIAGNOSIS — J0191 Acute recurrent sinusitis, unspecified: Secondary | ICD-10-CM | POA: Diagnosis not present

## 2022-02-18 DIAGNOSIS — J3089 Other allergic rhinitis: Secondary | ICD-10-CM | POA: Insufficient documentation

## 2022-02-18 LAB — POCT RAPID STREP A (OFFICE): Rapid Strep A Screen: NEGATIVE

## 2022-02-18 LAB — POC COVID19 BINAXNOW: SARS Coronavirus 2 Ag: NEGATIVE

## 2022-02-18 LAB — POCT INFLUENZA A/B
Influenza A, POC: NEGATIVE
Influenza B, POC: NEGATIVE

## 2022-02-18 MED ORDER — CEFDINIR 300 MG PO CAPS: 300.0000 mg | ORAL_CAPSULE | Freq: Two times a day (BID) | ORAL | 0 refills | Status: DC

## 2022-02-18 NOTE — Progress Notes (Signed)
   Subjective:    Patient ID: Richard Sellers, male    DOB: [birth year], 66 y.o.   MRN: [id number]  HPI Here for several issues. First he has been sick for the past  5 days with sinus pressure, decreased hearing in both ears, PND, and a dry cough. No ST or fever. He was here on 11-25-21 for a strep throat infection, and this was cleared with Cefdinir. He is now taking OTC "cold pills". He asks about allergy testing because he seems to be sick with some sort of URI a majority of th time in the past few years. I agreed with him that I have noticed his propensity to be ill.    Review of Systems  Constitutional: Negative.   HENT:  Positive for congestion, hearing loss, postnasal drip and sneezing. Negative for sore throat and trouble swallowing.   Eyes:  Positive for itching.  Respiratory:  Positive for cough. Negative for shortness of breath and wheezing.       Objective:   Physical Exam Constitutional:      Appearance: Normal appearance.  HENT:     Right Ear: Tympanic membrane, ear canal and external ear normal.     Left Ear: Tympanic membrane, ear canal and external ear normal.     Nose: No congestion.     Mouth/Throat:     Pharynx: No oropharyngeal exudate.  Eyes:     Conjunctiva/sclera: Conjunctivae normal.  Pulmonary:     Effort: Pulmonary effort is normal.     Breath sounds: Normal breath sounds.  Lymphadenopathy:     Cervical: No cervical adenopathy.  Neurological:     Mental Status: He is alert.          Assessment & Plan:  He has a sinus infection and we will treat this with another round of Cefdinir. Hfor the allergies, we will refer him for allergy testing.  Alysia Penna, MD

## 2022-02-20 ENCOUNTER — Encounter: Payer: Self-pay | Admitting: Family Medicine

## 2022-02-21 MED ORDER — METHYLPREDNISOLONE 4 MG PO TBPK: ORAL_TABLET | ORAL | 0 refills | Status: DC

## 2022-02-21 NOTE — Telephone Encounter (Signed)
I sent in a Medrol dose pack  

## 2022-03-26 ENCOUNTER — Telehealth (INDEPENDENT_AMBULATORY_CARE_PROVIDER_SITE_OTHER): Payer: Medicare Other | Admitting: Family Medicine

## 2022-03-26 ENCOUNTER — Encounter: Payer: Self-pay | Admitting: Family Medicine

## 2022-03-26 VITALS — Ht 68.0 in

## 2022-03-26 DIAGNOSIS — J3089 Other allergic rhinitis: Secondary | ICD-10-CM | POA: Diagnosis not present

## 2022-03-26 DIAGNOSIS — J069 Acute upper respiratory infection, unspecified: Secondary | ICD-10-CM

## 2022-03-26 DIAGNOSIS — H6983 Other specified disorders of Eustachian tube, bilateral: Secondary | ICD-10-CM

## 2022-03-26 DIAGNOSIS — H6993 Unspecified Eustachian tube disorder, bilateral: Secondary | ICD-10-CM

## 2022-03-26 MED ORDER — FLUTICASONE PROPIONATE 50 MCG/ACT NA SUSP: 1.0000 | Freq: Two times a day (BID) | NASAL | 1 refills | Status: DC

## 2022-03-26 MED ORDER — MONTELUKAST SODIUM 10 MG PO TABS: 10.0000 mg | ORAL_TABLET | Freq: Every day | ORAL | 0 refills | Status: DC

## 2022-03-26 NOTE — Progress Notes (Addendum)
Virtual Visit via Video Note I connected with Richard Sellers on 03/26/22 by a video enabled telemedicine application and verified that I am speaking with the correct person using two identifiers.  Location patient: home Location provider:work office Persons participating in the virtual visit: patient, provider  I discussed the limitations of evaluation and management by telemedicine and the availability of in person appointments. The patient expressed understanding and agreed to proceed.  Chief Complaint  Patient presents with   Cough   chest congestion   Headache   Sore Throat   HPI: Richard Sellers is a 66 year old male with history of hypertension, seasonal allergies, GERD, depression, anxiety, and CKD complaining of 2 days of above symptoms.  Fatigue, chills, sore throat, nasal congestion, rhinorrhea, and productive cough. He cannot bring sputum up. Mild dysphonia and postnasal drainage. + Sneezing spells and mild frontal pressure headache.  He has felt 'hot 'but has not checked temp. Negative for CP, wheezing, or dyspnea. Sore throat chills at night when in bed,it is better during the day. No sick contact or recent travel. Symptoms are stable.  Allergy rhinitis, currently he is not on treatment. Used to take [employer].  He has an appointment with immunologist in 04/2022. "Little" nausea, no heartburn. GERD on omeprazole 40 mg daily. Negative for abdominal pain, vomiting, diarrhea, body aches, or a skin rash.  Ear fullness, which he has had intermittently for a while. Negative for ear drainage. Evaluated by ENT about a year ago, according to the patient examination was negative. He has been treated with systemic steroids, which helped temporarily.  Complete antibiotic treatment, cefdinir 300 mg twice daily, prescribed on 02/18/2022 for sinus infection.  ROS: See pertinent positives and negatives per HPI.  Past Medical History:  Diagnosis Date   Allergy    seasonal  allergies   Anxiety    on meds   Chronic kidney disease    hx of kidney stones   Depression    on meds   ED (erectile dysfunction)    GERD (gastroesophageal reflux disease)    not on meds-diet controlled-uses OTC meds for tx   Heart murmur    at age 3 years old, heard x1 , never again   History of kidney stones    Hyperlipidemia    on meds   Hypertension    on meds   Hypothyroidism    hx of-was on meds-   PONV (postoperative nausea and vomiting)    Warts, genital    Past Surgical History:  Procedure Laterality Date   APPENDECTOMY  1975   COLONOSCOPY  11/08/2007   per Dr. Sharlett Iles, diverticulosis but no polyps, repeat in 10 yrs    [place] LITHOTRIPSY Left 10/29/2017   Procedure: LEFT EXTRACORPOREAL SHOCK WAVE LITHOTRIPSY (ESWL);  Surgeon: Franchot Gallo, MD;  Location: [facility];  Service: Urology;  Laterality: Left;   HERNIA REPAIR  16/1096   umbilical hernia   KNEE ARTHROSCOPY Right 1993   TONSILLECTOMY     WISDOM TOOTH EXTRACTION  1990   Family History  Problem Relation Age of Onset   Hypertension Other        family hx   Hyperlipidemia Other        family hx   Colon polyps Neg Hx    Colon cancer Neg Hx    Esophageal cancer Neg Hx    Stomach cancer Neg Hx    Rectal cancer Neg Hx    Social History  Socioeconomic History   Marital status: Divorced    Spouse name: Not on file   Number of children: Not on file   Years of education: Not on file   Highest education level: Not on file  Occupational History   Not on file  Tobacco Use   Smoking status: Former    Types: Cigarettes    Quit date: 09/22/2002    Years since quitting: 19.5   Smokeless tobacco: Never  Vaping Use   Vaping Use: Every day  Substance and Sexual Activity   Alcohol use: Yes    Alcohol/week: 28.0 standard drinks of alcohol    Types: 7 Standard drinks or equivalent, 21 Cans of beer per week    Comment: 2-3 beers per day   Drug use: No   Sexual  activity: Not on file  Other Topics Concern   Not on file  Social History Narrative   Not on file   Social Determinants of Health   Financial Resource Strain: Not on file  Food Insecurity: Not on file  Transportation Needs: Not on file  Physical Activity: Not on file  Stress: Not on file  Social Connections: Not on file  Intimate Partner Violence: Not on file   Current Outpatient Medications:    ALPRAZolam (XANAX) 1 MG tablet, Take 1 tablet (1 mg total) by mouth every 6 (six) hours as needed for anxiety. for anxiety, Disp: 120 tablet, Rfl: 5   amLODipine (NORVASC) 5 MG tablet, TAKE ONE TABLET BY MOUTH DAILY, Disp: 90 tablet, Rfl: 0   carvedilol (COREG) 12.5 MG tablet, TAKE ONE TABLET BY MOUTH TWICE A DAY WITH MEALS, Disp: 60 tablet, Rfl: 2   Cholecalciferol (VITAMIN D) 125 MCG (5000 UT) CAPS, Take 1 capsule by mouth once a week., Disp: 12 capsule, Rfl: 3   diclofenac (VOLTAREN) 75 MG EC tablet, Take 1 tablet (75 mg total) by mouth 2 (two) times daily., Disp: 60 tablet, Rfl: 5   fexofenadine (ALLEGRA) 60 MG tablet, Take 60 mg by mouth 2 (two) times daily., Disp: , Rfl:    gabapentin (NEURONTIN) 300 MG capsule, Take 1 capsule (300 mg total) by mouth 3 (three) times daily., Disp: 90 capsule, Rfl: 3   halobetasol (ULTRAVATE) 0.05 % cream, Apply topically 2 (two) times daily. APPLY TOPICALLY TWO TIMES A DAY, Disp: 50 g, Rfl: 5   losartan-hydrochlorothiazide (HYZAAR) 100-25 MG tablet, TAKE ONE TABLET BY MOUTH DAILY, Disp: 90 tablet, Rfl: 0   omeprazole (PRILOSEC) 40 MG capsule, TAKE ONE CAPSULE BY MOUTH DAILY, Disp: 90 capsule, Rfl: 0   ondansetron (ZOFRAN) 4 MG tablet, Take 1 tablet (4 mg total) by mouth every 6 (six) hours., Disp: 12 tablet, Rfl: 0   SYRINGE-NEEDLE, DISP, 3 ML (SAFETY SYRINGES/NEEDLE) 20G X 1-1/2" 3 ML MISC, 1 application by Does not apply route every 14 (fourteen) days., Disp: 50 each, Rfl: 2   tadalafil (CIALIS) 20 MG tablet, Take 1 tablet (20 mg total) by mouth daily as  needed for erectile dysfunction., Disp: 10 tablet, Rfl: 11   temazepam (RESTORIL) 15 MG capsule, TAKE TWO CAPSULES BY MOUTH EVERY NIGHT AT BEDTIME, Disp: 60 capsule, Rfl: 5   testosterone cypionate (DEPOTESTOSTERONE CYPIONATE) 200 MG/ML injection, INJECT 1ML INTRAMUSCULARLY EVERY 7 DAYS, Disp: 4 mL, Rfl: 5  EXAM:  VITALS per patient if applicable:Ht '5\' 8"'$  (1.727 m)   BMI 41.51 kg/m   GENERAL: alert, oriented, appears well and in no acute distress  HEENT: atraumatic, conjunctiva clear, no obvious abnormalities on inspection of  external nose and ears Mild dysphonia. Nasal congestion.  NECK: normal movements of the head and neck  LUNGS: on inspection no signs of respiratory distress, breathing rate appears normal, no obvious gross SOB, gasping or wheezing  CV: no obvious cyanosis  MS: moves all visible extremities without noticeable abnormality  PSYCH/NEURO: pleasant and cooperative, no obvious depression or anxiety, speech and thought processing grossly intact  ASSESSMENT AND PLAN:  Discussed the following assessment and plan:  Non-seasonal allergic rhinitis, unspecified trigger - Plan: fluticasone (FLONASE) 50 MCG/ACT nasal spray, montelukast (SINGULAIR) 10 MG tablet Recommend trying Flonase nasal spray again, caused nasal bleeding in the past after using it for several months. This time recommend using it daily for up to 14 days and then as needed. Singulair 10 mg at bedtime. Nasal saline irrigations as needed will also help. Most likely contributing greatly to his current symptoms. Keep appointment with immunologist in 04/2022.  URI, acute Hx suggests a viral etiology. Symptomatic treatment recommended at this time. Recommend performing a home COVID-19 test and to let me know if positive. Instructed to monitor for signs of complications, including new onset of fever among some, clearly instructed about warning signs.He needs to check temp. I also explained that cough and  nasal congestion can last a few days and sometimes weeks and can be aggravated by allergies as well. If still coughing in 10 days, I think it will be appropriate to have a CXR done. F/U as needed.  Dysfunction of both eustachian tubes This problem seems to be chronic. According to patient, he has been evaluated by ENT. Because he is comorbidities, I do not recommend decongestants. Recommend auto inflation maneuvers a few times throughout the day.  We discussed possible serious and likely etiologies, options for evaluation and workup, limitations of telemedicine visit vs in person visit, treatment, treatment risks and precautions. The patient was advised to call back or seek an in-person evaluation if the symptoms worsen or if the condition fails to improve as anticipated. I discussed the assessment and treatment plan with the patient. The patient was provided an opportunity to ask questions and all were answered. The patient agreed with the plan and demonstrated an understanding of the instructions.  Return if symptoms worsen or fail to improve.  Roshad Hack G. [place], MD  [facility]. [place] office.

## 2022-03-28 ENCOUNTER — Encounter: Payer: Self-pay | Admitting: Family Medicine

## 2022-03-28 ENCOUNTER — Telehealth: Payer: Self-pay | Admitting: Family Medicine

## 2022-03-28 NOTE — Telephone Encounter (Signed)
This would be up to Dr. Martinique

## 2022-03-28 NOTE — Telephone Encounter (Signed)
To be routed to Dr. Martinique, per Dr. Sarajane Jews from my chart message.

## 2022-03-28 NOTE — Telephone Encounter (Signed)
Letter sent via my chart

## 2022-03-28 NOTE — Telephone Encounter (Signed)
It is okay to change work excuse note to go back tomorrow as requested. Thanks, BJ

## 2022-03-28 NOTE — Telephone Encounter (Signed)
Pt saw dr Martinique on 03-26-2022 and would like to know  if he can get work note extended and return to work tomorrow 03/29/2022 . The work note he has was to return to work today 03-28-2022. Pt is still sick. Pt sent my chart message to dr fry today

## 2022-04-03 ENCOUNTER — Encounter: Payer: Self-pay | Admitting: Family Medicine

## 2022-04-03 ENCOUNTER — Other Ambulatory Visit: Payer: Self-pay | Admitting: Family Medicine

## 2022-04-03 DIAGNOSIS — R053 Chronic cough: Secondary | ICD-10-CM

## 2022-04-03 DIAGNOSIS — I10 Essential (primary) hypertension: Secondary | ICD-10-CM

## 2022-04-04 ENCOUNTER — Other Ambulatory Visit: Payer: Medicare Other

## 2022-04-04 ENCOUNTER — Ambulatory Visit (INDEPENDENT_AMBULATORY_CARE_PROVIDER_SITE_OTHER): Payer: Medicare Other

## 2022-04-04 DIAGNOSIS — R053 Chronic cough: Secondary | ICD-10-CM | POA: Diagnosis not present

## 2022-04-04 DIAGNOSIS — R059 Cough, unspecified: Secondary | ICD-10-CM | POA: Diagnosis not present

## 2022-04-04 NOTE — Telephone Encounter (Signed)
I would like for him to have a CXR before we make any treatment decisions. Our Xray is only here today until 12 noon. If he can come in this morning for a CXR, I will have the result this afternoon

## 2022-04-06 ENCOUNTER — Encounter: Payer: Self-pay | Admitting: Family Medicine

## 2022-04-07 ENCOUNTER — Other Ambulatory Visit: Payer: Self-pay

## 2022-04-07 DIAGNOSIS — R053 Chronic cough: Secondary | ICD-10-CM

## 2022-04-07 MED ORDER — LEVOFLOXACIN 500 MG PO TABS: ORAL_TABLET | ORAL | 0 refills | Status: DC

## 2022-04-11 ENCOUNTER — Encounter: Payer: Self-pay | Admitting: Family Medicine

## 2022-04-11 MED ORDER — METHYLPREDNISOLONE 4 MG PO TBPK: ORAL_TABLET | ORAL | 0 refills | Status: DC

## 2022-04-11 NOTE — Telephone Encounter (Signed)
I sent in a Medrol dose pack  

## 2022-04-22 ENCOUNTER — Other Ambulatory Visit: Payer: Self-pay | Admitting: Family Medicine

## 2022-04-22 DIAGNOSIS — J3089 Other allergic rhinitis: Secondary | ICD-10-CM

## 2022-04-29 ENCOUNTER — Encounter: Payer: Self-pay | Admitting: Family Medicine

## 2022-04-29 ENCOUNTER — Ambulatory Visit (INDEPENDENT_AMBULATORY_CARE_PROVIDER_SITE_OTHER): Payer: Medicare Other | Admitting: Family Medicine

## 2022-04-29 VITALS — BP 140/94 | HR 90 | Temp 98.2°F | Ht 68.0 in

## 2022-04-29 DIAGNOSIS — G4700 Insomnia, unspecified: Secondary | ICD-10-CM | POA: Diagnosis not present

## 2022-04-29 DIAGNOSIS — H6123 Impacted cerumen, bilateral: Secondary | ICD-10-CM | POA: Diagnosis not present

## 2022-04-29 NOTE — Progress Notes (Signed)
Established Patient Office Visit  Subjective   Patient ID: Richard Sellers, male    DOB: [birth year]  Age: 66 y.o. MRN: [id number]  Chief Complaint  Patient presents with   Insomnia    Patient complains of insomnia,    Ear Fullness    Patient complains of right ear fullness.     HPI   Patient seen with decreased hearing right ear really for a couple months now.  Apparently had a couple of back-to-back courses of steroids without much improvement.  Denies any dizziness.  No vertigo.  No tinnitus.  No ear pain.  No drainage.  Also relates some insomnia.  This appears to be chronic.  He takes at baseline alprazolam 1 mg 4 times daily.  Has apparently taken Restoril additionally in the past.  Did not do well with trazodone.  Occasional beer but no regular alcohol use.  His mother died 3 weeks ago.  He is coping fairly well but feels like his insomnia has worsened since then.  Tries to avoid late day use of caffeine.  Past Medical History:  Diagnosis Date   Allergy    seasonal allergies   Anxiety    on meds   Chronic kidney disease    hx of kidney stones   Depression    on meds   ED (erectile dysfunction)    GERD (gastroesophageal reflux disease)    not on meds-diet controlled-uses OTC meds for tx   Heart murmur    at age 70 years old, heard x1 , never again   History of kidney stones    Hyperlipidemia    on meds   Hypertension    on meds   Hypothyroidism    hx of-was on meds-   PONV (postoperative nausea and vomiting)    Warts, genital    Past Surgical History:  Procedure Laterality Date   APPENDECTOMY  1975   COLONOSCOPY  11/08/2007   per Dr. Sharlett Iles, diverticulosis but no polyps, repeat in 10 yrs    [place] LITHOTRIPSY Left 10/29/2017   Procedure: LEFT EXTRACORPOREAL SHOCK WAVE LITHOTRIPSY (ESWL);  Surgeon: Franchot Gallo, MD;  Location: [facility];  Service: Urology;  Laterality: Left;   HERNIA REPAIR  [id number]   umbilical hernia    KNEE ARTHROSCOPY Right 1993   TONSILLECTOMY     WISDOM TOOTH EXTRACTION  1990    reports that he quit smoking about 19 years ago. His smoking use included cigarettes. He has never used smokeless tobacco. He reports current alcohol use of about 28.0 standard drinks of alcohol per week. He reports that he does not use drugs. family history includes Hyperlipidemia in an other family member; Hypertension in an other family member. No Known Allergies  Review of Systems  HENT:  Positive for hearing loss. Negative for congestion, ear discharge and ear pain.   Psychiatric/Behavioral:  The patient has insomnia.       Objective:     BP (!) 140/94 (BP Location: Left Arm, Patient Position: Sitting, Cuff Size: Large)   Pulse 90   Temp 98.2 F (36.8 C) (Oral)   Ht '5\' 8"'$  (1.727 m)   SpO2 96%   BMI 41.51 kg/m    Physical Exam Vitals reviewed.  HENT:     Ears:     Comments: Both ear canals are fully impacted with cerumen. Cardiovascular:     Rate and Rhythm: Normal rate.  Neurological:     Mental Status: He  is alert.  Psychiatric:        Mood and Affect: Mood normal.        Thought Content: Thought content normal.      No results found for any visits on 04/29/22.    The 10-year ASCVD risk score (Arnett DK, et al., 2019) is: 19.3%    Assessment & Plan:   #1 bilateral cerumen impactions.  Discussed risk of irrigation including risk of pain, bleeding, low risk of perforation.  Patient consented.  Both ear canals were irrigated and patient tolerated well.  Cerumen plugs were removed bilaterally.  #2 acute on chronic insomnia.  Discussed sleep hygiene.  Handout given.  Patient takes alprazolam 1 mg 4 times daily at baseline.  He requested refill of Restoril but hesitant to use in combination with alprazolam.  Has tried trazodone previously but did not do well with that.  Will try to focus on nonpharmacologic strategies.  He did have some interest in counseling regarding grief and  handout given  No follow-ups on file.    Carolann Littler, MD

## 2022-05-12 ENCOUNTER — Other Ambulatory Visit: Payer: Self-pay | Admitting: Family Medicine

## 2022-05-12 DIAGNOSIS — K219 Gastro-esophageal reflux disease without esophagitis: Secondary | ICD-10-CM

## 2022-06-10 ENCOUNTER — Encounter: Payer: Self-pay | Admitting: Family Medicine

## 2022-06-10 ENCOUNTER — Ambulatory Visit (INDEPENDENT_AMBULATORY_CARE_PROVIDER_SITE_OTHER): Payer: Medicare Other | Admitting: Family Medicine

## 2022-06-10 VITALS — BP 142/100 | HR 78 | Temp 98.8°F

## 2022-06-10 DIAGNOSIS — M25552 Pain in left hip: Secondary | ICD-10-CM | POA: Diagnosis not present

## 2022-06-10 MED ORDER — HYDROCODONE-ACETAMINOPHEN 10-325 MG PO TABS: 1.0000 | ORAL_TABLET | ORAL | 0 refills | Status: AC | PRN

## 2022-06-10 MED ORDER — KETOROLAC TROMETHAMINE 60 MG/2ML IM SOLN
60.0000 mg | Freq: Once | INTRAMUSCULAR | Status: AC
  Administered 2022-06-10: 60 mg via INTRAMUSCULAR

## 2022-06-10 NOTE — Addendum Note (Signed)
Addended by: Wyvonne Lenz on: 06/10/2022 03:27 PM   Modules accepted: Orders

## 2022-06-10 NOTE — Progress Notes (Signed)
   Subjective:    Patient ID: Richard Sellers, male    DOB: 1956/03/28, 66 y.o.   MRN: [id number]  HPI Here for 5 days of severe left hip pain. He has never had this before. No recent trauma. He has chronic low back pain but this is different. The pain starts in the anterior hip area and extends down the anterior thigh to the knee. He says he has fallen 3 times because the left leg gave out on him.    Review of Systems  Constitutional: Negative.   Respiratory: Negative.    Cardiovascular: Negative.   Musculoskeletal:  Positive for arthralgias.       Objective:   Physical Exam Constitutional:      Comments: Limping. In pain   Cardiovascular:     Rate and Rhythm: Normal rate and regular rhythm.     Pulses: Normal pulses.     Heart sounds: Normal heart sounds.  Pulmonary:     Effort: Pulmonary effort is normal.     Breath sounds: Normal breath sounds.  Musculoskeletal:     Comments: He is not tender over the lower back or over the left sciatic notch. He is not tender over the left great trochanter. He is tender in the left anterior groin area. ROM is full to flexion and internal/external rotation, but extension causes a lot of pain  Neurological:     Mental Status: He is alert.           Assessment & Plan:  Acute left hip pain. He is given a shot of Toradol today as well as some Norco to use for pain control. Refer to Orthopedics asap.  Alysia Penna, MD

## 2022-06-11 ENCOUNTER — Encounter: Payer: Self-pay | Admitting: Family Medicine

## 2022-06-12 NOTE — Telephone Encounter (Signed)
We referred him to Orthopedics for this so hopefully he can get to see them soon. In the meantime he can use Ibuprofen as needed

## 2022-06-16 ENCOUNTER — Encounter: Payer: Self-pay | Admitting: Family Medicine

## 2022-06-16 DIAGNOSIS — M25552 Pain in left hip: Secondary | ICD-10-CM | POA: Diagnosis not present

## 2022-06-18 ENCOUNTER — Encounter: Payer: Self-pay | Admitting: Family Medicine

## 2022-06-19 ENCOUNTER — Encounter: Payer: Self-pay | Admitting: Family Medicine

## 2022-06-19 NOTE — Telephone Encounter (Signed)
I referred him to [facility] on 06-10-22. I suggest he give them a call to expedite things

## 2022-07-02 ENCOUNTER — Other Ambulatory Visit: Payer: Self-pay | Admitting: Family Medicine

## 2022-07-02 DIAGNOSIS — I10 Essential (primary) hypertension: Secondary | ICD-10-CM

## 2022-07-10 ENCOUNTER — Encounter: Payer: Self-pay | Admitting: Family Medicine

## 2022-07-11 MED ORDER — ALPRAZOLAM 1 MG PO TABS: 1.0000 mg | ORAL_TABLET | Freq: Four times a day (QID) | ORAL | 5 refills | Status: DC | PRN

## 2022-07-11 NOTE — Telephone Encounter (Unsigned)
Done

## 2022-07-13 DIAGNOSIS — M25552 Pain in left hip: Secondary | ICD-10-CM | POA: Diagnosis not present

## 2022-07-16 DIAGNOSIS — M25552 Pain in left hip: Secondary | ICD-10-CM | POA: Diagnosis not present

## 2022-08-10 ENCOUNTER — Other Ambulatory Visit: Payer: Self-pay | Admitting: Family Medicine

## 2022-08-10 DIAGNOSIS — K219 Gastro-esophageal reflux disease without esophagitis: Secondary | ICD-10-CM

## 2022-08-27 ENCOUNTER — Telehealth: Payer: Self-pay | Admitting: Family Medicine

## 2022-08-27 NOTE — Telephone Encounter (Signed)
Left message for patient to call back and schedule Medicare Annual Wellness Visit (AWV) either virtually or in office. Left  my Herbie Drape number [phone]   *due 08/22/22 awvi per [facility]  please schedule with Nurse Health Adviser   45 min for awv-i and in office appointments 30 min for awv-s  phone/virtual appointments

## 2022-09-03 ENCOUNTER — Encounter: Payer: Self-pay | Admitting: Family Medicine

## 2022-09-03 ENCOUNTER — Ambulatory Visit (INDEPENDENT_AMBULATORY_CARE_PROVIDER_SITE_OTHER): Payer: Medicare Other | Admitting: Family Medicine

## 2022-09-03 VITALS — BP 132/80 | HR 71 | Temp 98.6°F

## 2022-09-03 DIAGNOSIS — R059 Cough, unspecified: Secondary | ICD-10-CM

## 2022-09-03 DIAGNOSIS — J019 Acute sinusitis, unspecified: Secondary | ICD-10-CM | POA: Diagnosis not present

## 2022-09-03 DIAGNOSIS — R519 Headache, unspecified: Secondary | ICD-10-CM

## 2022-09-03 LAB — POCT INFLUENZA A/B
Influenza A, POC: NEGATIVE
Influenza B, POC: NEGATIVE

## 2022-09-03 LAB — POC COVID19 BINAXNOW: SARS Coronavirus 2 Ag: NEGATIVE

## 2022-09-03 MED ORDER — TRIAMCINOLONE ACETONIDE 0.1 % EX CREA: 1.0000 | TOPICAL_CREAM | Freq: Two times a day (BID) | CUTANEOUS | 5 refills | Status: AC | PRN

## 2022-09-03 MED ORDER — CEFDINIR 300 MG PO CAPS: 300.0000 mg | ORAL_CAPSULE | Freq: Two times a day (BID) | ORAL | 0 refills | Status: DC

## 2022-09-03 NOTE — Progress Notes (Signed)
   Subjective:    Patient ID: Richard Sellers, male    DOB: [birth year], 66 y.o.   MRN: [id number]  HPI Here for 5 days of stuffy head, PND, and a dry cough. No fever or SOB.    Review of Systems  Constitutional: Negative.   HENT:  Positive for congestion, postnasal drip and sinus pressure. Negative for ear pain and sore throat.   Eyes: Negative.   Respiratory:  Positive for cough. Negative for shortness of breath and wheezing.        Objective:   Physical Exam Constitutional:      Appearance: Normal appearance.  HENT:     Right Ear: Tympanic membrane, ear canal and external ear normal.     Left Ear: Tympanic membrane, ear canal and external ear normal.     Nose: Nose normal.     Mouth/Throat:     Pharynx: Oropharynx is clear.  Eyes:     Conjunctiva/sclera: Conjunctivae normal.  Pulmonary:     Effort: Pulmonary effort is normal.     Breath sounds: Normal breath sounds.  Lymphadenopathy:     Cervical: No cervical adenopathy.  Neurological:     Mental Status: He is alert.           Assessment & Plan:  Sinusitis, treat with 10 days of Cefdinir.  Alysia Penna, MD

## 2022-09-14 IMAGING — MR MR LUMBAR SPINE W/O CM
4 of 5 series · 18 of 48 positions shown · non-contrast
Comparison: None.

CLINICAL DATA: Lumbar radiculopathy, symptoms persist with > 6 wks
treatment.

EXAM:
MRI LUMBAR SPINE WITHOUT CONTRAST
TECHNIQUE: Multiplanar, multisequence MR imaging of the lumbar spine was
performed. No intravenous contrast was administered.

[Series 5: T1 · sagittal · 4.0mm · 0.73mm/px · 3 of 17 slices shown (1 of 2)]
[im 4/17]
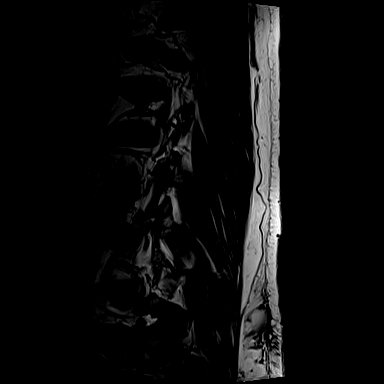
[im 10/17]
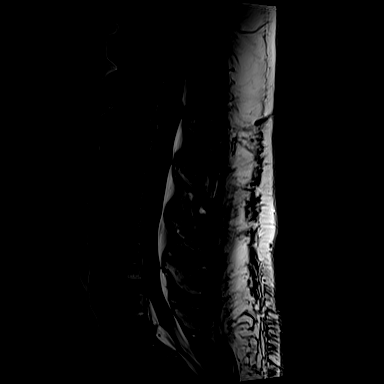
[im 17/17]
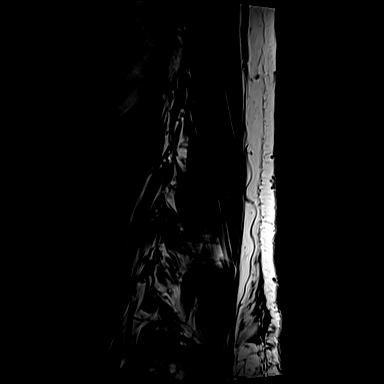

[Series 6: T2 · sagittal · 4.0mm · 0.73mm/px · 6 of 17 slices shown (1 of 2)]
[im 1/17]
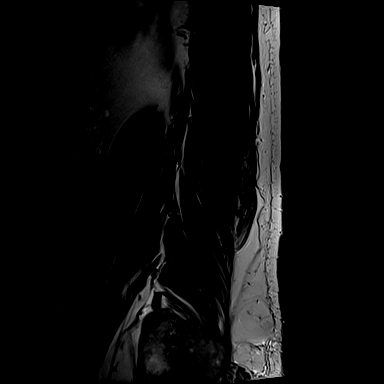
[im 4/17]
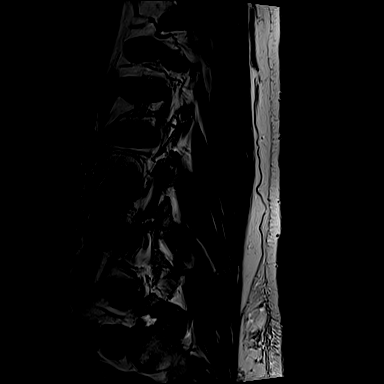
[im 7/17]
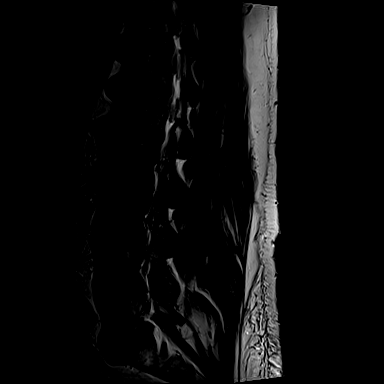
[im 10/17]
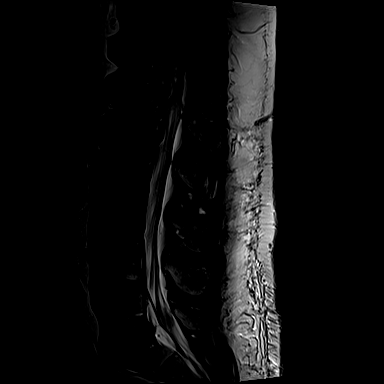
[im 13/17]
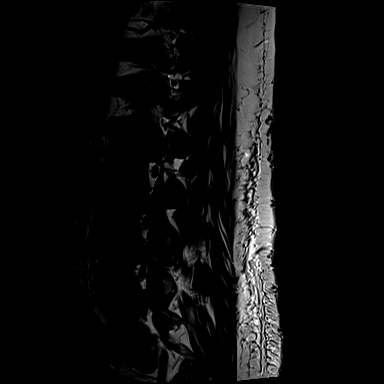
[im 17/17]
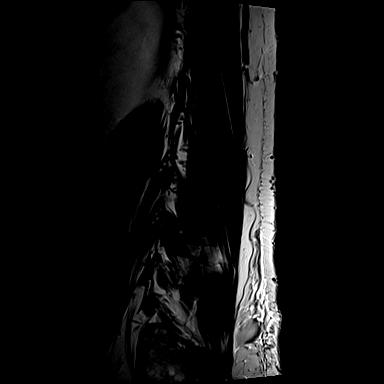

[Series 12: T2 · axial · 4.0mm · 0.28mm/px · z∈[-120,+67]mm · 6 of 41 slices shown (2 of 2)]
[im 1/41]
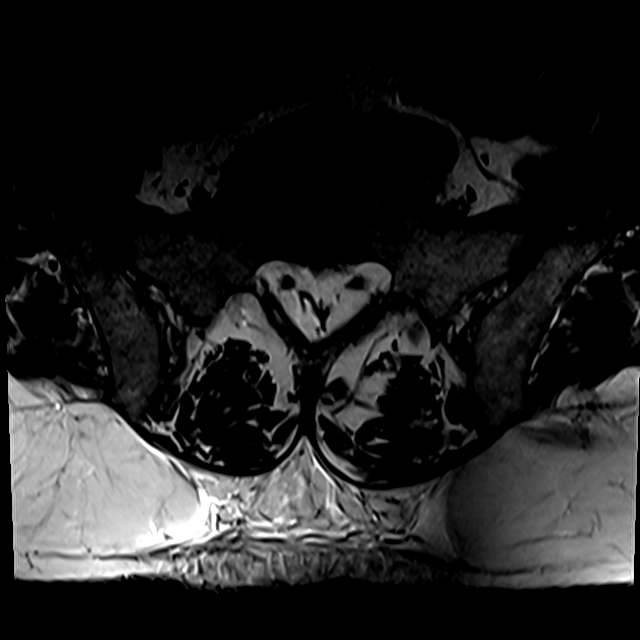
[im 6/41]
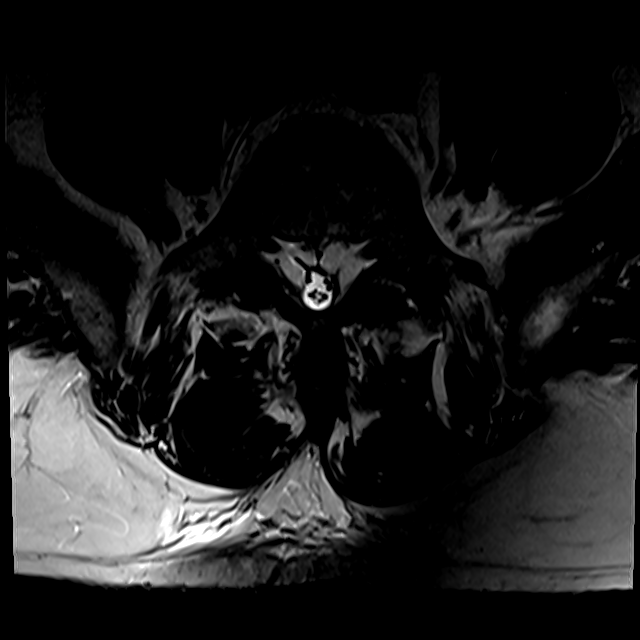
[im 12/41]
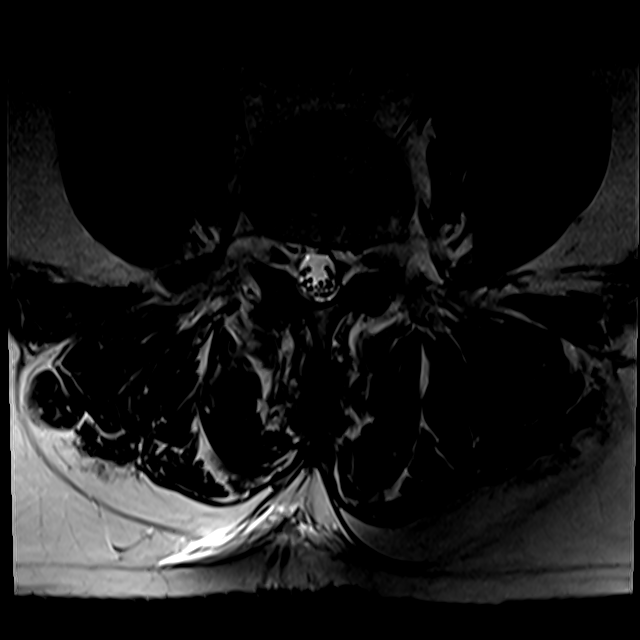
[im 18/41]
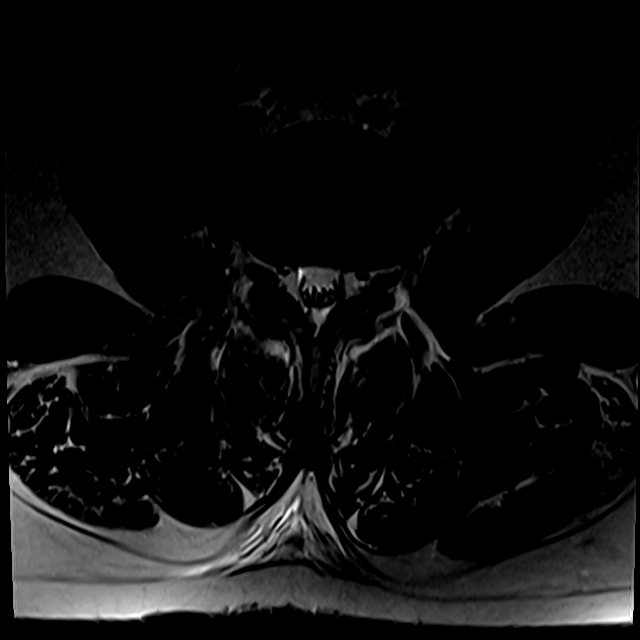
[im 21/41]
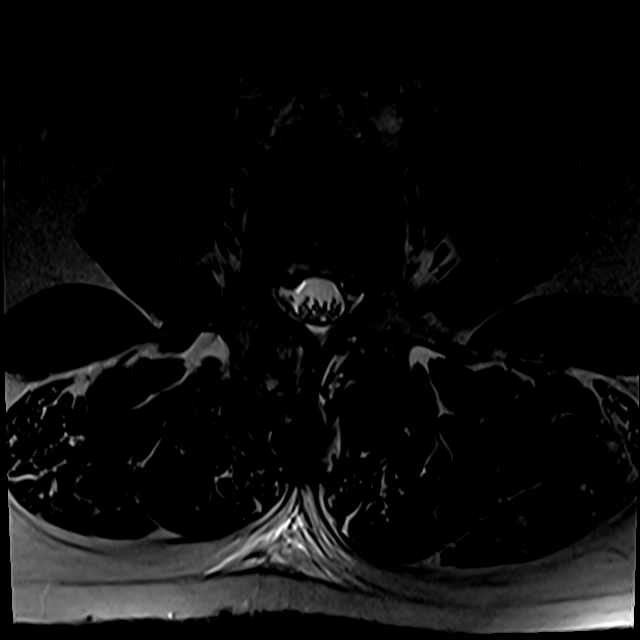
[im 35/41]
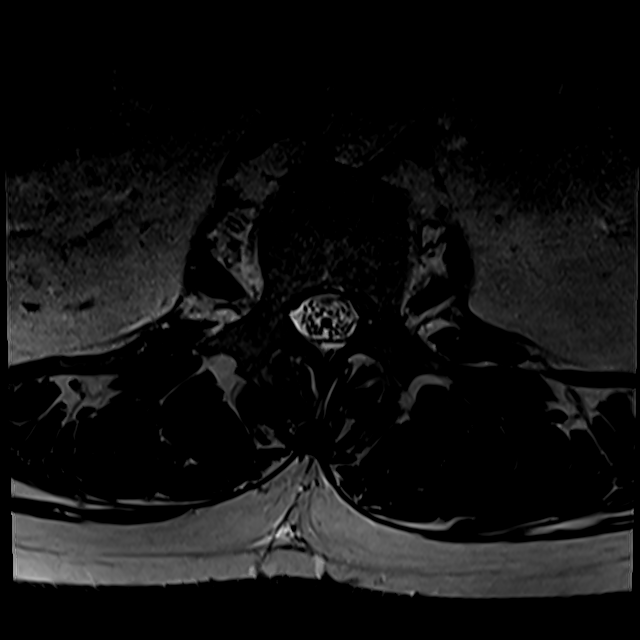

[Series 100: T1 · axial · 4.0mm · 0.28mm/px · z∈[-95,+67]mm · 3 of 41 slices shown (2 of 2)]
[im 6/41]
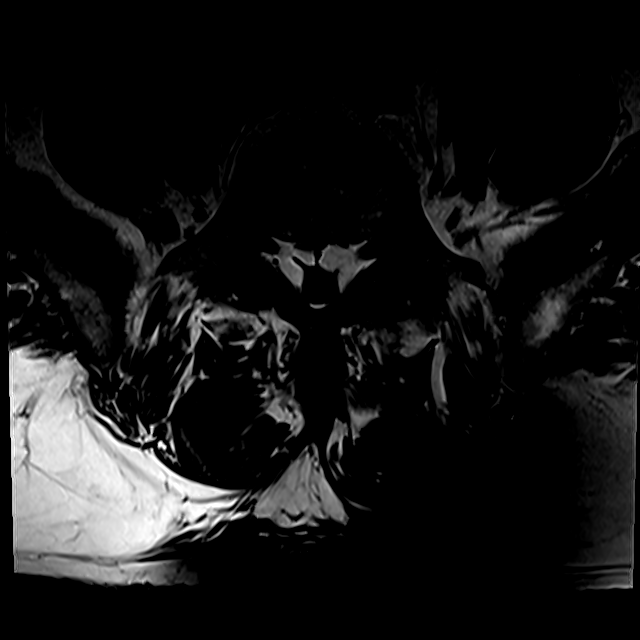
[im 21/41]
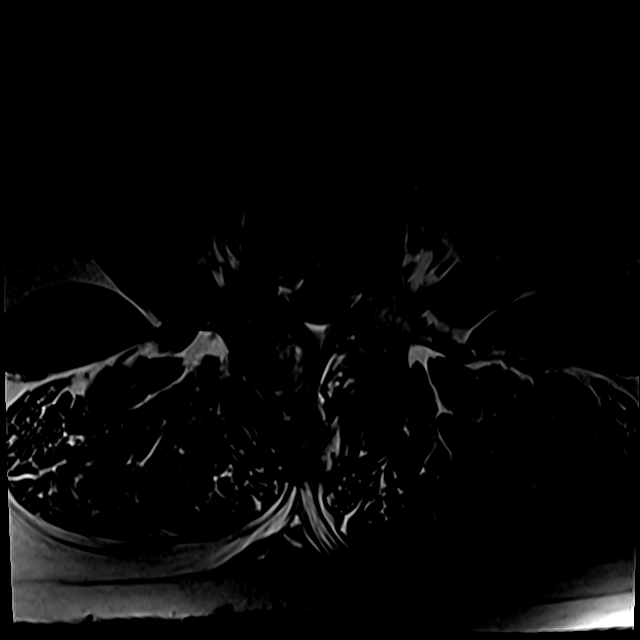
[im 35/41]
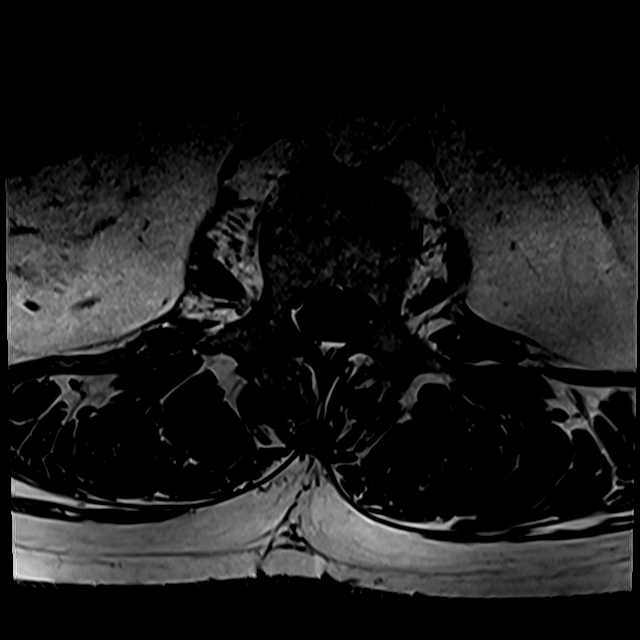

[18 of 48 positions shown; findings below may reference images not displayed]

FINDINGS: Segmentation:  Standard.

Alignment:  Physiologic.

Vertebrae: No evidence of fracture or discitis. A 9 mm T1
hypointense, T2 hyperintense lesion in the S1 body. In the absence
of known history of malignancy, this most likely represent an
atypical hemangioma. Endplate degenerative changes at L5-S1.

Conus medullaris and cauda equina: Conus extends to the L1 level.
Conus and cauda equina appear normal.

Paraspinal and other soft tissues: Right renal cyst.

Disc levels:

T12-L1: Disc bulge and mild facet degenerative changes without
significant spinal canal or neural foraminal stenosis.

L1-2: Disc bulge and mild facet degenerative changes resulting in
mild bilateral neural foraminal narrowing. No significant spinal
canal stenosis.

L2-3: Shallow disc bulge and mild facet degenerative changes without
significant spinal canal or neural foraminal stenosis.

L3-4: Shallow disc bulge and mild facet degenerative change without
significant spinal canal or neural foraminal stenosis.

L4-5: Shallow disc bulge and mild to moderate facet degenerative
changes without significant spinal canal or neural foraminal
stenosis.

L5-S1: Loss of disc height, left asymmetric disc bulge with
associated osteophytic component and moderate facet degenerative
changes resulting in mild left neural foraminal narrowing. Epidural
lipomatosis narrowing the thecal sac at this level.
IMPRESSION: 1. Mild degenerative changes of the lumbar spine without high-grade
spinal canal or neural foraminal stenosis.
2. Epidural lipomatosis along the spinal canal, more pronounced at
L5-S1 where it causes narrowing of the thecal sac.

## 2022-09-29 ENCOUNTER — Ambulatory Visit (INDEPENDENT_AMBULATORY_CARE_PROVIDER_SITE_OTHER): Payer: Medicare Other | Admitting: Family Medicine

## 2022-09-29 ENCOUNTER — Encounter: Payer: Self-pay | Admitting: Family Medicine

## 2022-09-29 VITALS — BP 150/110 | HR 80 | Temp 98.8°F | Ht 68.0 in

## 2022-09-29 DIAGNOSIS — J209 Acute bronchitis, unspecified: Secondary | ICD-10-CM

## 2022-09-29 DIAGNOSIS — J029 Acute pharyngitis, unspecified: Secondary | ICD-10-CM | POA: Diagnosis not present

## 2022-09-29 DIAGNOSIS — R52 Pain, unspecified: Secondary | ICD-10-CM

## 2022-09-29 DIAGNOSIS — R0981 Nasal congestion: Secondary | ICD-10-CM | POA: Diagnosis not present

## 2022-09-29 LAB — POCT RAPID STREP A (OFFICE): Rapid Strep A Screen: NEGATIVE

## 2022-09-29 LAB — POC COVID19 BINAXNOW: SARS Coronavirus 2 Ag: NEGATIVE

## 2022-09-29 LAB — POCT INFLUENZA A/B
Influenza A, POC: NEGATIVE
Influenza B, POC: NEGATIVE

## 2022-09-29 MED ORDER — DM-GUAIFENESIN ER 30-600 MG PO TB12: 1.0000 | ORAL_TABLET | Freq: Two times a day (BID) | ORAL | 0 refills | Status: DC

## 2022-09-29 MED ORDER — AZITHROMYCIN 250 MG PO TABS: ORAL_TABLET | ORAL | 0 refills | Status: DC

## 2022-09-29 MED ORDER — METHYLPREDNISOLONE 4 MG PO TBPK: ORAL_TABLET | ORAL | 0 refills | Status: DC

## 2022-09-29 NOTE — Progress Notes (Signed)
Established Patient Office Visit  Subjective   Patient ID: Richard Sellers, male    DOB: 12-Mar-1956  Age: 67 y.o. MRN: [id number]  Chief Complaint  Patient presents with   Sore Throat    X4 days   Headache    X4 days   Sinus Problem    Patient complains of head congestion x4 days, tried Coricidrin with some relief   Generalized Body Aches    X4 days    Pt is reporting 4 day history of chest congestion, sore throat, fever/chills and body aches. States that he has been extremely tired lately, hasn't been able to go to work yesterday and today. Pt reports there are other people sick at work also.       Review of Systems  All other systems reviewed and are negative.     Objective:     BP (!) 150/110 Comment: repeated by Mykal--jaf  Pulse 80   Temp 98.8 F (37.1 C) (Oral)   Ht '5\' 8"'$  (1.727 m)   SpO2 96%   BMI 41.51 kg/m    Physical Exam Vitals reviewed.  Constitutional:      Appearance: Normal appearance. He is well-groomed and normal weight.  HENT:     Right Ear: Tympanic membrane normal.     Left Ear: Tympanic membrane normal.     Nose: Congestion present.     Mouth/Throat:     Mouth: Mucous membranes are moist.     Pharynx: Pharyngeal swelling and posterior oropharyngeal erythema (white spots on the soft palate-- severe erythema of the soft palate and oropharynx) present.  Eyes:     Extraocular Movements: Extraocular movements intact.     Conjunctiva/sclera: Conjunctivae normal.  Cardiovascular:     Rate and Rhythm: Normal rate and regular rhythm.     Heart sounds: S1 normal and S2 normal. No murmur heard. Pulmonary:     Effort: Pulmonary effort is normal.     Breath sounds: Normal air entry. Wheezing (lower lobes with force expiration) present. No rales.  Abdominal:     General: Abdomen is flat.  Musculoskeletal:     Right lower leg: No edema.     Left lower leg: No edema.  Neurological:     General: No focal deficit present.     Mental Status:  He is alert and oriented to person, place, and time.     Gait: Gait is intact.  Psychiatric:        Mood and Affect: Mood and affect normal.      Results for orders placed or performed in visit on 09/29/22  POC COVID-19 BinaxNow  Result Value Ref Range   SARS Coronavirus 2 Ag Negative Negative  POCT Influenza A/B  Result Value Ref Range   Influenza A, POC Negative Negative   Influenza B, POC Negative Negative  POCT rapid strep A  Result Value Ref Range   Rapid Strep A Screen Negative Negative      The 10-year ASCVD risk score (Arnett DK, et al., 2019) is: 22.9%    Assessment & Plan:   Problem List Items Addressed This Visit   None Visit Diagnoses     Sore throat    -  Primary   Relevant Orders   POC COVID-19 BinaxNow (Completed)   POCT Influenza A/B (Completed)   POCT rapid strep A (Completed)   Body aches       Relevant Orders   POCT Influenza A/B (Completed)   Nasal congestion  Relevant Orders   POC COVID-19 BinaxNow (Completed)   POCT Influenza A/B (Completed)   Acute bronchitis, unspecified organism       Relevant Medications   methylPREDNISolone (MEDROL DOSEPAK) 4 MG TBPK tablet   azithromycin (ZITHROMAX Z-PAK) 250 MG tablet   dextromethorphan-guaiFENesin (MUCINEX DM) 30-600 MG 12hr tablet     Flu and COVID are negative as well as the strep test. He does have wheezing with forced expiration. I recommend medrol dose pak, tapering over a 6 day course, then using zithromax 250 mg, 2 tablets on day 1, then 1 tablet on days 2-5. Mucinex DM 600 mg BID.  Return if symptoms worsen or fail to improve.    Farrel Conners, MD

## 2022-10-06 ENCOUNTER — Encounter: Payer: Self-pay | Admitting: Family Medicine

## 2022-10-06 NOTE — Telephone Encounter (Signed)
Ok to send work excuse for the additional day.

## 2022-10-09 ENCOUNTER — Other Ambulatory Visit: Payer: Self-pay | Admitting: Family Medicine

## 2022-10-09 DIAGNOSIS — I10 Essential (primary) hypertension: Secondary | ICD-10-CM

## 2022-10-13 ENCOUNTER — Telehealth: Payer: Self-pay | Admitting: Family Medicine

## 2022-10-15 ENCOUNTER — Other Ambulatory Visit: Payer: Self-pay | Admitting: Family Medicine

## 2022-10-15 NOTE — Telephone Encounter (Signed)
Last refill-01/24/2022 Last OV with Dr. Michael-09/29/2022

## 2022-10-24 ENCOUNTER — Encounter: Payer: Self-pay | Admitting: Family Medicine

## 2022-10-24 ENCOUNTER — Ambulatory Visit (INDEPENDENT_AMBULATORY_CARE_PROVIDER_SITE_OTHER): Payer: Medicare Other | Admitting: Family Medicine

## 2022-10-24 VITALS — BP 130/98 | HR 103 | Temp 100.9°F

## 2022-10-24 DIAGNOSIS — B349 Viral infection, unspecified: Secondary | ICD-10-CM | POA: Diagnosis not present

## 2022-10-24 DIAGNOSIS — R509 Fever, unspecified: Secondary | ICD-10-CM

## 2022-10-24 LAB — POCT INFLUENZA A/B
Influenza A, POC: NEGATIVE
Influenza B, POC: NEGATIVE

## 2022-10-24 LAB — POC COVID19 BINAXNOW: SARS Coronavirus 2 Ag: NEGATIVE

## 2022-10-24 LAB — POCT RAPID STREP A (OFFICE): Rapid Strep A Screen: NEGATIVE

## 2022-10-24 MED ORDER — ALBUTEROL SULFATE HFA 108 (90 BASE) MCG/ACT IN AERS: 2.0000 | INHALATION_SPRAY | RESPIRATORY_TRACT | 1 refills | Status: DC | PRN

## 2022-10-24 NOTE — Progress Notes (Signed)
   Subjective:    Patient ID: Richard Sellers, male    DOB: Dec 25, 1955, 67 y.o.   MRN: [id number]  HPI Here for 3 days of headache, body aches, fever, and a dry cough. He felt somewhat SOB last night but not now.    Review of Systems  Constitutional:  Positive for fever.  HENT:  Positive for congestion. Negative for ear pain, postnasal drip, sinus pain and sore throat.   Eyes: Negative.   Respiratory:  Positive for cough, shortness of breath and wheezing.        Objective:   Physical Exam Constitutional:      Appearance: He is ill-appearing.  HENT:     Right Ear: Tympanic membrane, ear canal and external ear normal.     Left Ear: Tympanic membrane, ear canal and external ear normal.     Nose: Nose normal.     Mouth/Throat:     Pharynx: Oropharynx is clear.  Eyes:     Conjunctiva/sclera: Conjunctivae normal.  Pulmonary:     Effort: Pulmonary effort is normal.     Breath sounds: Normal breath sounds.  Lymphadenopathy:     Cervical: No cervical adenopathy.  Neurological:     Mental Status: He is alert.           Assessment & Plan:  Viral illness. He will rest, drink fluids, and take Ibuprofen. Use the albuterol inhaler as needed.  Alysia Penna, MD

## 2022-10-25 ENCOUNTER — Encounter: Payer: Self-pay | Admitting: Family Medicine

## 2022-10-27 NOTE — Telephone Encounter (Signed)
It will likely take some more time to get over this virus. Continue symptomatic measures. We can give him more time off work if he needs this

## 2022-10-28 NOTE — Telephone Encounter (Signed)
disregard

## 2022-11-03 NOTE — Telephone Encounter (Signed)
Use Mucinex 1200 mg BID for congestion

## 2022-11-06 ENCOUNTER — Telehealth (INDEPENDENT_AMBULATORY_CARE_PROVIDER_SITE_OTHER): Payer: Medicare Other | Admitting: Family Medicine

## 2022-11-06 DIAGNOSIS — Z0000 Encounter for general adult medical examination without abnormal findings: Secondary | ICD-10-CM

## 2022-11-06 NOTE — Progress Notes (Signed)
PATIENT CHECK-IN and HEALTH RISK ASSESSMENT QUESTIONNAIRE:  -completed by phone/video for upcoming Medicare Preventive Visit  Pre-Visit Check-in: 1)Vitals (height, wt, BP, etc) - record in vitals section for visit on day of visit 2)Review and Update Medications, Allergies PMH, Surgeries, Social history in Epic 3)Hospitalizations in the last year with date/reason?  No 4)Review and Update Care Team (patient's specialists) in Epic 5) Complete PHQ9 in Epic  6) Complete Fall Screening in Epic 7)Review all Health Maintenance Due and order under PCP if not done.  Medicare Wellness Patient Questionnaire:  Answer theses question about your habits: Do you drink alcohol? Yes    If yes, how many drinks do you have a day?couple of beers a day Have you ever smoked?Yes Quit date if applicable?  stopped smoking 18 years How many packs a day do/did you smoke? 1 pack.   Patient does vape some Do you use smokeless tobacco?No Do you use an illicit drugs?No Do you exercises? No, but walks all day and is on his feet a lot at work. Feels like balance is ok. Used to work out. Are you sexually active? No Number of partners?none Typical breakfast: Nothing Typical lunch: chicken, turkey,cheese Typical dinner:TV dinner, sandwich Typical snacks:cookie  Beverages: very seldom drink any thing during the day   Answer theses question about you: Can you perform most household chores?No, roommates does chores  Do you find it hard to follow a conversation in a noisy room?No Do you often ask people to speak up or repeat themselves?No  Do you feel that you have a problem with memory?No Do you balance your checkbook and or bank acounts?Yes, online banking Do you feel safe at home?Yes Last dentist visit?3 years  Do you need assistance with any of the following: Please note if so No assistance needed  Driving?  Feeding yourself?  Getting from bed to chair?  Getting to the toilet?  Bathing or showering?  Dressing  yourself?  Managing money?  Climbing a flight of stairs  Preparing meals?    Do you have Advanced Directives in place (Living Will, Healthcare Power or Attorney)? No   Last eye Exam and location?5 years ago.  [address]   Do you currently use prescribed or non-prescribed narcotic or opioid pain medications? non  Do you have a history or close family history of breast, ovarian, tubal or peritoneal cancer or a family member with BRCA (breast cancer susceptibility 1 and 2) gene mutations?  Nurse/Assistant Credentials/time stamp:ST   ----------------------------------------------------------------------------------------------------------------------------------------------------------------------------------------------------------------------    MEDICARE ANNUAL PREVENTIVE CARE VISIT WITH PROVIDER (Welcome to Medicare, initial annual wellness or annual wellness exam)  Virtual Visit via Video Note  I connected with Richard Sellers on 11/06/22  by a video enabled telemedicine application and verified that I am speaking with the correct person using two identifiers.  Location patient: home Location provider:work or home office Persons participating in the virtual visit: patient, provider  Concerns and/or follow up today: stable, doing better after a viral illness finally. No doing good.    See HM section in Epic for other details of completed HM.    ROS: negative for report of fevers, unintentional weight loss, vision changes, vision loss, hearing loss or change, chest pain, sob, hemoptysis, melena, hematochezia, hematuria, genital discharge or lesions, falls, bleeding or bruising, loc, thoughts of suicide or self harm, memory loss  Patient-completed extensive health risk assessment - reviewed and discussed with the patient: See Health Risk Assessment completed with patient prior to the visit either  above or in recent phone note. This was reviewed in detailed with the  patient today and appropriate recommendations, orders and referrals were placed as needed per Summary below and patient instructions.   Review of Medical History: -PMH, PSH, Family History and current specialty and care providers reviewed and updated and listed below   Patient Care Team: Laurey Morale, MD as PCP - General   Past Medical History:  Diagnosis Date   Allergy    seasonal allergies   Anxiety    on meds   Chronic kidney disease    hx of kidney stones   Depression    on meds   ED (erectile dysfunction)    GERD (gastroesophageal reflux disease)    not on meds-diet controlled-uses OTC meds for tx   Heart murmur    at age 46 years old, heard x1 , never again   History of kidney stones    Hyperlipidemia    on meds   Hypertension    on meds   Hypothyroidism    hx of-was on meds-   PONV (postoperative nausea and vomiting)    Warts, genital     Past Surgical History:  Procedure Laterality Date   APPENDECTOMY  1975   COLONOSCOPY  11/08/2007   per Dr. Sharlett Iles, diverticulosis but no polyps, repeat in 10 yrs    CYSTECTOMY     EXTRACORPOREAL SHOCK WAVE LITHOTRIPSY Left 10/29/2017   Procedure: LEFT EXTRACORPOREAL SHOCK WAVE LITHOTRIPSY (ESWL);  Surgeon: Franchot Gallo, MD;  Location: [facility];  Service: Urology;  Laterality: Left;   HERNIA REPAIR  [id number]   umbilical hernia   KNEE ARTHROSCOPY Right 1993   TONSILLECTOMY     WISDOM TOOTH EXTRACTION  1990    Social History   Socioeconomic History   Marital status: Divorced    Spouse name: Not on file   Number of children: Not on file   Years of education: Not on file   Highest education level: Not on file  Occupational History   Not on file  Tobacco Use   Smoking status: Former    Types: Cigarettes    Quit date: 09/22/2002    Years since quitting: 20.1   Smokeless tobacco: Never  Vaping Use   Vaping Use: Every day  Substance and Sexual Activity   Alcohol use: Yes    Alcohol/week: 28.0 standard drinks of  alcohol    Types: 7 Standard drinks or equivalent, 21 Cans of beer per week    Comment: 2-3 beers per day   Drug use: No   Sexual activity: Not on file  Other Topics Concern   Not on file  Social History Narrative   Not on file   Social Determinants of Health   Financial Resource Strain: Not on file  Food Insecurity: Not on file  Transportation Needs: Not on file  Physical Activity: Not on file  Stress: Not on file  Social Connections: Not on file  Intimate Partner Violence: Not on file    Family History  Problem Relation Age of Onset   Hypertension Other        family hx   Hyperlipidemia Other        family hx   Colon polyps Neg Hx    Colon cancer Neg Hx    Esophageal cancer Neg Hx    Stomach cancer Neg Hx    Rectal cancer Neg Hx     Current Outpatient Medications on File Prior to Visit  Medication Sig Dispense  Refill   albuterol (VENTOLIN HFA) 108 (90 Base) MCG/ACT inhaler Inhale 2 puffs into the lungs every 4 (four) hours as needed for wheezing or shortness of breath. 8 g 1   ALPRAZolam (XANAX) 1 MG tablet Take 1 tablet (1 mg total) by mouth every 6 (six) hours as needed for anxiety. for anxiety 120 tablet 5   amLODipine (NORVASC) 5 MG tablet TAKE 1 TABLET BY MOUTH DAILY 90 tablet 0   carvedilol (COREG) 12.5 MG tablet TAKE 1 TABLET BY MOUTH TWICE A DAY WITH A MEAL 180 tablet 0   Cholecalciferol (VITAMIN D) 125 MCG (5000 UT) CAPS Take 1 capsule by mouth once a week. 12 capsule 3   dextromethorphan-guaiFENesin (MUCINEX DM) 30-600 MG 12hr tablet Take 1 tablet by mouth 2 (two) times daily. 30 tablet 0   diclofenac (VOLTAREN) 75 MG EC tablet Take 1 tablet (75 mg total) by mouth 2 (two) times daily. 60 tablet 5   fexofenadine (ALLEGRA) 60 MG tablet Take 60 mg by mouth 2 (two) times daily.     fluticasone (FLONASE) 50 MCG/ACT nasal spray Place 1 spray into both nostrils 2 (two) times daily. 16 g 1   gabapentin (NEURONTIN) 300 MG capsule Take 1 capsule (300 mg total) by mouth  3 (three) times daily. 90 capsule 3   halobetasol (ULTRAVATE) 0.05 % cream Apply topically 2 (two) times daily. APPLY TOPICALLY TWO TIMES A DAY 50 g 5   losartan-hydrochlorothiazide (HYZAAR) 100-25 MG tablet TAKE 1 TABLET BY MOUTH DAILY 90 tablet 0   methylPREDNISolone (MEDROL DOSEPAK) 4 MG TBPK tablet Take package as directed 21 each 0   montelukast (SINGULAIR) 10 MG tablet TAKE 1 TABLET BY MOUTH AT BEDTIME 90 tablet 3   omeprazole (PRILOSEC) 40 MG capsule TAKE 1 CAPSULE BY MOUTH DAILY 90 capsule 1   ondansetron (ZOFRAN) 4 MG tablet Take 1 tablet (4 mg total) by mouth every 6 (six) hours. 12 tablet 0   SYRINGE-NEEDLE, DISP, 3 ML (SAFETY SYRINGES/NEEDLE) 20G X 1-1/2" 3 ML MISC 1 application by Does not apply route every 14 (fourteen) days. 50 each 2   tadalafil (CIALIS) 20 MG tablet Take 1 tablet (20 mg total) by mouth daily as needed for erectile dysfunction. 10 tablet 11   temazepam (RESTORIL) 15 MG capsule TAKE TWO CAPSULES BY MOUTH EVERY NIGHT AT BEDTIME 60 capsule 5   testosterone cypionate (DEPOTESTOSTERONE CYPIONATE) 200 MG/ML injection INJECT 1 ML INTRAMUSCULARLY EVERY 7 DAYS 4 mL 3   triamcinolone cream (KENALOG) 0.1 % Apply 1 Application topically 2 (two) times daily as needed. 45 g 5   No current facility-administered medications on file prior to visit.    No Known Allergies     Physical Exam There were no vitals filed for this visit. Estimated body mass index is 41.51 kg/m as calculated from the following:   Height as of 09/29/22: 5' 8"$  (1.727 m).   Weight as of 02/25/21: 273 lb (123.8 kg).  EKG (optional): deferred due to virtual visit  GENERAL: alert, oriented, no acute distress detected; full vision exam deferred due to pandemic and/or virtual encounter  HEENT: atraumatic, conjunttiva clear, no obvious abnormalities on inspection of external nose and ears  NECK: normal movements of the head and neck  LUNGS: on inspection no signs of respiratory distress, breathing rate  appears normal, no obvious gross SOB, gasping or wheezing  CV: no obvious cyanosis  MS: moves all visible extremities without noticeable abnormality  PSYCH/NEURO: pleasant and cooperative, no obvious depression or  anxiety, speech and thought processing grossly intact, Cognitive function grossly intact  Flowsheet Row Video Visit from 11/06/2022 in [place] [facility]  PHQ-9 Total Score 17           11/06/2022    3:25 PM 06/10/2022    2:22 PM 02/18/2022    1:54 PM 08/30/2021    1:34 PM 07/11/2021    8:28 AM  Depression screen PHQ 2/9  Decreased Interest 3 0 2 1 3  $ Down, Depressed, Hopeless 3 0 2 2 3  $ PHQ - 2 Score 6 0 4 3 6  $ Altered sleeping 3 0 1 1 1  $ Tired, decreased energy 3 0 2 3 2  $ Change in appetite 3 0 2 3 3  $ Feeling bad or failure about yourself  2 0 2 2 2  $ Trouble concentrating 0 0 1 2 1  $ Moving slowly or fidgety/restless 0 0 1 1 0  Suicidal thoughts 0 0 0 0 0  PHQ-9 Score 17 0 13 15 15  $ Difficult doing work/chores Not difficult at all Not difficult at all Somewhat difficult  Very difficult  Has chronic depression.      02/15/2021    5:57 AM 08/30/2021    1:35 PM 02/18/2022    1:55 PM 06/10/2022    2:23 PM 11/06/2022    3:23 PM  Fall Risk  Falls in the past year?  1 1 0 1  Was there an injury with Fall?  0 0 0 1  Fall Risk Category Calculator  1 1 0 3  Fall Risk Category (Retired)  Low Low Low   (RETIRED) Patient Fall Risk Level Low fall risk Low fall risk Low fall risk Low fall risk   Patient at Risk for Falls Due to  No Fall Risks Impaired balance/gait No Fall Risks   Fall risk Follow up   Falls evaluation completed Falls evaluation completed   Did not get hurt - was when he was dealing with hip issues. 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 Fell from the pain. Now hip is doing better.    SUMMARY AND PLAN:  Encounter for annual wellness exam in Medicare patient    Discussed applicable health maintenance/preventive  health measures and advised and referred or ordered per patient preferences:  Health Maintenance  Topic Date Due   Zoster Vaccines- Shingrix (1 of 2) Never done, plans to do at the pharmacy   Pneumonia Vaccine 56+ Years old (1 of 1 - PCV) Never done, plans to do at pharmacy   COVID-19 Vaccine (3 - 2023-24 season) 05/23/2022, plans to do at the [place]  12/22/2022 (Originally 04/22/2022)   Hepatitis C Screening  09/30/2023 (Originally 08/31/1974)   COLONOSCOPY (Pts 45-22yr Insurance coverage will need to be confirmed)  06/05/2023   Medicare Annual Wellness (AWV)  11/07/2023   DTaP/Tdap/Td (2 - Tdap) 06/16/2027   HPV VACCINES  Aged Out   Due for prostate cancer screening and physical. Sent message to office to contact pt to schedule. Discussed with patient - he agrees to call to schedule if he is not contacted.   Education and counseling on the following was provided based on the above review of health and a plan/checklist for the patient, along with additional information discussed, was provided for the patient in the patient instructions :  -Advised on importance of and resources for completing advanced directives. Info provided in patient instructions. -Provided counseling and plan for increased risk of  falling if applicable per above screening. Discussed fall precuations/ balance exercises - provided in patient instructions.  -Provided counseling and plan for function difficulties/ difficulties with ADLs if applicable per above screening. -Advised and counseled on maintaining healthy weight and healthy lifestyle - including the importance of a healthy diet, regular physical activity, social connections and stress management. -Advised and counseled on a whole foods based healthy diet and regular exercise: discussed a heart healthy whole foods based diet at length. A summary of a healthy diet was provided in the Patient Instructions.. Recommended regular exercise and  discussed options within the community and at home.  -Advised yearly dental visits at minimum and regular eye exams -he is interested in counseling - [employer] and number provided - he was in the process of scheduling but had not heard back, advised he call them today after our visit -due for physical - sent message to staff at PCP office to assist in scheduling -Advised and counseled on alcohol and vaping.  Follow up: see patient instructions   Patient Instructions  I really enjoyed getting to talk with you today! I am available on Tuesdays and Thursdays for virtual visits if you have any questions or concerns, or if I can be of any further assistance.   CHECKLIST FROM ANNUAL WELLNESS VISIT:  -Follow up (please call to schedule if not scheduled after visit):  -Inperson visit with your Primary Doctor office: Please schedule your Physical Exam -yearly for annual wellness visit with primary care office  Here is a list of your preventive care/health maintenance measures and the plan for each if any are due:  Health Maintenance  Topic Date Due   Zoster Vaccines- Shingrix (1 of 2) Never done   Pneumonia Vaccine 48+ Years old (1 of 1 - PCV) Never done   COVID-19 Vaccine (3 - 2023-24 season) 05/23/2022   INFLUENZA VACCINE  12/22/2022 (Originally 04/22/2022)   Hepatitis C Screening  09/30/2023 (Originally 08/31/1974)   COLONOSCOPY (Pts 45-88yr Insurance coverage will need to be confirmed)  06/05/2023   Medicare Annual Wellness (AWV)  11/07/2023   DTaP/Tdap/Td (2 - Tdap) 06/16/2027   HPV VACCINES  Aged Out    -See a dentist at least yearly  -Get your eyes checked and then per your eye specialist's recommendations  -Other issues addressed today:   -I have included below further information regarding a healthy whole foods based diet, physical activity guidelines for adults, stress management and opportunities for social connections. I hope you find this information  useful.   -----------------------------------------------------------------------------------------------------------------------------------------------------------------------------------------------------------------------------------------------------------  NUTRITION: -eat real food: lots of colorful vegetables (half the plate) and fruits -5-7 servings of vegetables and fruits per day (fresh or steamed is best), exp. 2 servings of vegetables with lunch and dinner and 2 servings of fruit per day. Berries and greens such as kale and collards are great choices.  -consume on a regular basis: whole grains (make sure first ingredient on label contains the word "whole"), fresh fruits, fish, nuts, seeds, healthy oils (such as olive oil, avocado oil, grape seed oil) -may eat small amounts of dairy and lean meat on occasion, but avoid processed meats such as ham, bacon, lunch meat, etc. -drink water -try to avoid fast food and pre-packaged foods, processed meat -most experts advise limiting sodium to < 23043mper day, should limit further is any chronic conditions such as high blood pressure, heart disease, diabetes, etc. The American Heart Association advised that < 150080ms is ideal -try to avoid foods  that contain any ingredients with names you do not recognize  -try to avoid sugar/sweets (except for the natural sugar that occurs in fresh fruit) -try to avoid sweet drinks -try to avoid white rice, white bread, pasta (unless whole grain), white or yellow potatoes  EXERCISE GUIDELINES FOR ADULTS: -if you wish to increase your physical activity, do so gradually and with the approval of your doctor -STOP and seek medical care immediately if you have any chest pain, chest discomfort or trouble breathing when starting or increasing exercise  -move and stretch your body, legs, feet and arms when sitting for long periods -Physical activity guidelines for optimal health in adults: -least 150 minutes per  week of aerobic exercise (can talk, but not sing) once approved by your doctor, 20-30 minutes of sustained activity or two 10 minute episodes of sustained activity every day.  -resistance training at least 2 days per week if approved by your doctor -balance exercises 3+ days per week:   Stand somewhere where you have something sturdy to hold onto if you lose balance.    1) lift up on toes, start with 5x per day and work up to 20x   2) stand and lift on leg straight out to the side so that foot is a few inches of the floor, start with 5x each side and work up to 20x each side   3) stand on one foot, start with 5 seconds each side and work up to 20 seconds on each side  If you need ideas or help with getting more active:  -Silver sneakers https://tools.silversneakers.com  -Walk with a Doc: http://stephens-thompson.biz/  -try to include resistance (weight lifting/strength building) and balance exercises twice per week: or the following link for ideas: ChessContest.fr  UpdateClothing.com.cy  STRESS MANAGEMENT: -can try meditating, or just sitting quietly with deep breathing while intentionally relaxing all parts of your body for 5 minutes daily -if you need further help with stress, anxiety or depression please follow up with your primary doctor or contact the wonderful folks at [place]: [place]: -options in [place] if you wish to engage in more social and exercise related activities:  -Silver sneakers https://tools.silversneakers.com  -Walk with a Doc: http://stephens-thompson.biz/  -Check out the [place] 50+ section on the [place] [employer] (hiking clubs, book clubs, cards and games, chess, exercise classes, aquatic classes and much more) - see the website for  details: https://www.[place].gov/departments/parks-recreation/active-adults50  -YouTube has lots of exercise videos for different ages and abilities as well  -[place] (a variety of indoor and outdoor inperson activities for adults). 725-135-5689. [address].  -Virtual Online Classes (a variety of topics): see seniorplanet.org or call [phone]  -consider volunteering at a school, hospice center, church, senior center or elsewhere   ADVANCED HEALTHCARE DIRECTIVES:  Everyone should have advanced health care directives in place. This is so that you get the care you want, should you ever be in a situation where you are unable to make your own medical decisions.   From the [place] Advanced Directive Website: "[place] are legal documents in which you give written instructions about your health care if, in the future, you cannot speak for yourself.   A health care power of attorney allows you to name a person you trust to make your health care decisions if you cannot make them yourself. A declaration of a desire for a natural death (or living will) is document, which states that  you desire not to have your life prolonged by extraordinary measures if you have a terminal or incurable illness or if you are in a vegetative state. An advance instruction for mental health treatment makes a declaration of instructions, information and preferences regarding your mental health treatment. It also states that you are aware that the advance instruction authorizes a mental health treatment provider to act according to your wishes. It may also outline your consent or refusal of mental health treatment. A declaration of an anatomical gift allows anyone over the age of 44 to make a gift by will, organ donor card or other document."   Please see the following website or an elder law attorney for forms, FAQs and for completion of advanced directives: [place] Secretary of [place] ([email or web address])  Or copy and paste the following to your web browser: [email or web address].cy          Lucretia Kern, DO

## 2022-11-06 NOTE — Patient Instructions (Addendum)
I really enjoyed getting to talk with you today! I am available on Tuesdays and Thursdays for virtual visits if you have any questions or concerns, or if I can be of any further assistance.   CHECKLIST FROM ANNUAL WELLNESS VISIT:  -Follow up (please call to schedule if not scheduled after visit):  -Inperson visit with your Primary Doctor office: Please schedule your Physical Exam -yearly for annual wellness visit with primary care office  Here is a list of your preventive care/health maintenance measures and the plan for each if any are due:  Health Maintenance  Topic Date Due   Zoster Vaccines- Shingrix (1 of 2) Never done   Pneumonia Vaccine 64+ Years old (1 of 1 - PCV) Never done   COVID-19 Vaccine (3 - 2023-24 season) 05/23/2022   INFLUENZA VACCINE  12/22/2022 (Originally 04/22/2022)   Hepatitis C Screening  09/30/2023 (Originally 08/31/1974)   COLONOSCOPY (Pts 45-78yr Insurance coverage will need to be confirmed)  06/05/2023   Medicare Annual Wellness (AWV)  11/07/2023   DTaP/Tdap/Td (2 - Tdap) 06/16/2027   HPV VACCINES  Aged Out    -See a dentist at least yearly  -Get your eyes checked and then per your eye specialist's recommendations  -Other issues addressed today:   -I have included below further information regarding a healthy whole foods based diet, physical activity guidelines for adults, stress management and opportunities for social connections. I hope you find this information useful.   -----------------------------------------------------------------------------------------------------------------------------------------------------------------------------------------------------------------------------------------------------------  NUTRITION: -eat real food: lots of colorful vegetables (half the plate) and fruits -5-7 servings of vegetables and fruits per day (fresh or steamed is best), exp. 2 servings of vegetables with lunch and dinner and 2 servings of fruit  per day. Berries and greens such as kale and collards are great choices.  -consume on a regular basis: whole grains (make sure first ingredient on label contains the word "whole"), fresh fruits, fish, nuts, seeds, healthy oils (such as olive oil, avocado oil, grape seed oil) -may eat small amounts of dairy and lean meat on occasion, but avoid processed meats such as ham, bacon, lunch meat, etc. -drink water -try to avoid fast food and pre-packaged foods, processed meat -most experts advise limiting sodium to < 2304mper day, should limit further is any chronic conditions such as high blood pressure, heart disease, diabetes, etc. The American Heart Association advised that < 150069ms is ideal -try to avoid foods that contain any ingredients with names you do not recognize  -try to avoid sugar/sweets (except for the natural sugar that occurs in fresh fruit) -try to avoid sweet drinks -try to avoid white rice, white bread, pasta (unless whole grain), white or yellow potatoes  EXERCISE GUIDELINES FOR ADULTS: -if you wish to increase your physical activity, do so gradually and with the approval of your doctor -STOP and seek medical care immediately if you have any chest pain, chest discomfort or trouble breathing when starting or increasing exercise  -move and stretch your body, legs, feet and arms when sitting for long periods -Physical activity guidelines for optimal health in adults: -least 150 minutes per week of aerobic exercise (can talk, but not sing) once approved by your doctor, 20-30 minutes of sustained activity or two 10 minute episodes of sustained activity every day.  -resistance training at least 2 days per week if approved by your doctor -balance exercises 3+ days per week:   Stand somewhere where you have something sturdy to hold onto if you lose balance.  1) lift up on toes, start with 5x per day and work up to 20x   2) stand and lift on leg straight out to the side so that foot  is a few inches of the floor, start with 5x each side and work up to 20x each side   3) stand on one foot, start with 5 seconds each side and work up to 20 seconds on each side  If you need ideas or help with getting more active:  -Silver sneakers https://tools.silversneakers.com  -Walk with a Doc: http://stephens-thompson.biz/  -try to include resistance (weight lifting/strength building) and balance exercises twice per week: or the following link for ideas: ChessContest.fr  UpdateClothing.com.cy  STRESS MANAGEMENT: -can try meditating, or just sitting quietly with deep breathing while intentionally relaxing all parts of your body for 5 minutes daily -if you need further help with stress, anxiety or depression please follow up with your primary doctor or contact the wonderful folks at [facility]: [place]: -options in [place] if you wish to engage in more social and exercise related activities:  -Silver sneakers https://tools.silversneakers.com  -Walk with a Doc: http://stephens-thompson.biz/  -Check out the [place] 50+ section on the [place] [employer] (hiking clubs, book clubs, cards and games, chess, exercise classes, aquatic classes and much more) - see the website for details: https://www.[place].gov/departments/parks-recreation/active-adults50  -YouTube has lots of exercise videos for different ages and abilities as well  -[place] (a variety of indoor and outdoor inperson activities for adults). [phone]. [address]  -Virtual Online Classes (a variety of topics): see seniorplanet.org or call [phone]  -consider volunteering at a school, hospice center, church, senior center or elsewhere   ADVANCED HEALTHCARE DIRECTIVES:  Everyone should have advanced health care directives in  place. This is so that you get the care you want, should you ever be in a situation where you are unable to make your own medical decisions.   From the [place] Advanced Directive Website: "[place] are legal documents in which you give written instructions about your health care if, in the future, you cannot speak for yourself.   A health care power of attorney allows you to name a person you trust to make your health care decisions if you cannot make them yourself. A declaration of a desire for a natural death (or living will) is document, which states that you desire not to have your life prolonged by extraordinary measures if you have a terminal or incurable illness or if you are in a vegetative state. An advance instruction for mental health treatment makes a declaration of instructions, information and preferences regarding your mental health treatment. It also states that you are aware that the advance instruction authorizes a mental health treatment provider to act according to your wishes. It may also outline your consent or refusal of mental health treatment. A declaration of an anatomical gift allows anyone over the age of 22 to make a gift by will, organ donor card or other document."   Please see the following website or an elder law attorney for forms, FAQs and for completion of advanced directives: [place] [employer] [place] ([email or web address])  Or copy and paste the following to your web browser: [email or web address]

## 2022-11-28 ENCOUNTER — Encounter: Payer: Self-pay | Admitting: Family Medicine

## 2022-11-28 MED ORDER — ALPRAZOLAM 1 MG PO TABS: 1.0000 mg | ORAL_TABLET | Freq: Four times a day (QID) | ORAL | 5 refills | Status: DC | PRN

## 2022-11-28 NOTE — Telephone Encounter (Signed)
Done

## 2022-12-30 ENCOUNTER — Other Ambulatory Visit: Payer: Self-pay | Admitting: Family Medicine

## 2023-01-09 ENCOUNTER — Other Ambulatory Visit: Payer: Self-pay | Admitting: Family Medicine

## 2023-01-09 DIAGNOSIS — I10 Essential (primary) hypertension: Secondary | ICD-10-CM

## 2023-01-23 ENCOUNTER — Encounter: Payer: Self-pay | Admitting: Family Medicine

## 2023-01-23 ENCOUNTER — Ambulatory Visit (INDEPENDENT_AMBULATORY_CARE_PROVIDER_SITE_OTHER): Payer: Medicare Other | Admitting: Family Medicine

## 2023-01-23 VITALS — BP 124/80 | HR 85 | Temp 99.0°F

## 2023-01-23 DIAGNOSIS — R110 Nausea: Secondary | ICD-10-CM

## 2023-01-23 DIAGNOSIS — R1011 Right upper quadrant pain: Secondary | ICD-10-CM | POA: Diagnosis not present

## 2023-01-23 LAB — LIPASE: Lipase: 20 U/L (ref 11.0–59.0)

## 2023-01-23 LAB — CBC WITH DIFFERENTIAL/PLATELET
Basophils Absolute: 0.1 10*3/uL (ref 0.0–0.1)
Basophils Relative: 0.9 % (ref 0.0–3.0)
Eosinophils Absolute: 0.2 10*3/uL (ref 0.0–0.7)
Eosinophils Relative: 2.6 % (ref 0.0–5.0)
HCT: 47.5 % (ref 39.0–52.0)
Hemoglobin: 16.1 g/dL (ref 13.0–17.0)
Lymphocytes Relative: 19.7 % (ref 12.0–46.0)
Lymphs Abs: 1.5 10*3/uL (ref 0.7–4.0)
MCHC: 33.9 g/dL (ref 30.0–36.0)
MCV: 96.8 fl (ref 78.0–100.0)
Monocytes Absolute: 0.4 10*3/uL (ref 0.1–1.0)
Monocytes Relative: 5.8 % (ref 3.0–12.0)
Neutro Abs: 5.3 10*3/uL (ref 1.4–7.7)
Neutrophils Relative %: 71 % (ref 43.0–77.0)
Platelets: 335 10*3/uL (ref 150.0–400.0)
RBC: 4.9 Mil/uL (ref 4.22–5.81)
RDW: 15.2 % (ref 11.5–15.5)
WBC: 7.5 10*3/uL (ref 4.0–10.5)

## 2023-01-23 LAB — COMPREHENSIVE METABOLIC PANEL
ALT: 36 U/L (ref 0–53)
AST: 36 U/L (ref 0–37)
Albumin: 4.4 g/dL (ref 3.5–5.2)
Alkaline Phosphatase: 73 U/L (ref 39–117)
BUN: 17 mg/dL (ref 6–23)
CO2: 27 mEq/L (ref 19–32)
Calcium: 10 mg/dL (ref 8.4–10.5)
Chloride: 98 mEq/L (ref 96–112)
Creatinine, Ser: 1.37 mg/dL (ref 0.40–1.50)
GFR: 53.8 mL/min — ABNORMAL LOW (ref >60.00)
Glucose, Bld: 119 mg/dL — ABNORMAL HIGH (ref 70–99)
Potassium: 4.5 mEq/L (ref 3.5–5.1)
Sodium: 136 mEq/L (ref 135–145)
Total Bilirubin: 0.9 mg/dL (ref 0.2–1.2)
Total Protein: 8.3 g/dL (ref 6.0–8.3)

## 2023-01-23 MED ORDER — ONDANSETRON HCL 4 MG PO TABS: 4.0000 mg | ORAL_TABLET | Freq: Three times a day (TID) | ORAL | 2 refills | Status: DC | PRN

## 2023-01-23 NOTE — Progress Notes (Unsigned)
Established Patient Office Visit  Subjective   Patient ID: Richard Sellers, male    DOB: August 22, 1956  Age: 67 y.o. MRN: [id number]  Chief Complaint  Patient presents with   Nausea    Intermittently xweeks   Abdominal Pain    X2 days   Depression    X6 years, patient feels as if he about to have another nervous breakdown, had the same symptoms with nausea, etc at that time due to divorce, loss of his mother, health issues    Patient reports he woke up last night and was feeling very hot, felt like he was going to be sick/ had nausea. States that he has had stomach issues in the past. States that he has history of GERD, states that he takes prilosec PRN. States that it has been going on the last few weeks, intermittent, comes and goes. Not associated with meals, but states it happens in the  Abdominal Pain This is a recurrent problem. The current episode started yesterday. The onset quality is sudden.  Depression         Current Outpatient Medications  Medication Instructions   albuterol (VENTOLIN HFA) 108 (90 Base) MCG/ACT inhaler 2 puffs, Inhalation, Every 4 hours PRN   ALPRAZolam (XANAX) 1 mg, Oral, Every 6 hours PRN, for anxiety   amLODipine (NORVASC) 5 mg, Oral, Daily   carvedilol (COREG) 12.5 MG tablet TAKE 1 TABLET BY MOUTH TWICE A DAY WITH A MEAL   Cholecalciferol (VITAMIN D) 125 MCG (5000 UT) CAPS 1 capsule, Oral, Weekly   dextromethorphan-guaiFENesin (MUCINEX DM) 30-600 MG 12hr tablet 1 tablet, Oral, 2 times daily   diclofenac (VOLTAREN) 75 mg, Oral, 2 times daily   fexofenadine (ALLEGRA) 60 mg, Oral, 2 times daily   fluticasone (FLONASE) 50 MCG/ACT nasal spray 1 spray, Each Nare, 2 times daily   gabapentin (NEURONTIN) 300 mg, Oral, 3 times daily   halobetasol (ULTRAVATE) 0.05 % cream Topical, 2 times daily, APPLY TOPICALLY TWO TIMES A DAY   losartan-hydrochlorothiazide (HYZAAR) 100-25 MG tablet 1 tablet, Oral, Daily   montelukast (SINGULAIR) 10 mg, Oral, Daily at  bedtime   omeprazole (PRILOSEC) 40 mg, Oral, Daily   ondansetron (ZOFRAN) 4 mg, Oral, Every 8 hours PRN   SYRINGE-NEEDLE, DISP, 3 ML (SAFETY SYRINGES/NEEDLE) 20G X 1-1/2" 3 ML MISC 1 application , Does not apply, Every 14 days   tadalafil (CIALIS) 20 mg, Oral, Daily PRN   temazepam (RESTORIL) 15 MG capsule TAKE TWO CAPSULES BY MOUTH EVERY NIGHT AT BEDTIME   testosterone cypionate (DEPOTESTOSTERONE CYPIONATE) 200 MG/ML injection INJECT 1 ML INTRAMUSCULARLY EVERY 7 DAYS   triamcinolone cream (KENALOG) 0.1 % 1 Application, Topical, 2 times daily PRN    Patient Active Problem List   Diagnosis Date Noted   Environmental and seasonal allergies 02/18/2022   Depression with anxiety 11/28/2020   Arthritis 04/06/2020   Hypogonadism in male 09/01/2018   Bilateral leg edema 02/17/2017   Hypothyroidism 11/26/2016   GERD 05/30/2010   PSORIASIS 05/30/2010   Essential hypertension 09/25/2008      Review of Systems  Gastrointestinal:  Positive for abdominal pain.  Psychiatric/Behavioral:  Positive for depression.   All other systems reviewed and are negative.     Objective:     BP 124/80 (BP Location: Left Arm, Patient Position: Sitting, Cuff Size: Large)   Pulse 85   Temp 99 F (37.2 C) (Oral)   SpO2 97%  {Vitals History (Optional):23777}  Physical Exam Constitutional:      General:  He is not in acute distress.    Appearance: He is well-developed. He is obese.  Neurological:     Mental Status: He is alert.      No results found for any visits on 01/23/23.  {Labs (Optional):23779}  The 10-year ASCVD risk score (Arnett DK, et al., 2019) is: 22.4%    Assessment & Plan:  Nausea -     Comprehensive metabolic panel -     Lipase -     CBC with Differential/Platelet -     US ABDOMEN LIMITED RUQ (LIVER/GB); Future -     Ondansetron HCl; Take 1 tablet (4 mg total) by mouth every 8 (eight) hours as needed for nausea or vomiting.  Dispense: 30 tablet; Refill: 2     Return if  symptoms worsen or fail to improve.    Karie Georges, MD

## 2023-01-26 ENCOUNTER — Ambulatory Visit (HOSPITAL_BASED_OUTPATIENT_CLINIC_OR_DEPARTMENT_OTHER)
Admission: RE | Admit: 2023-01-26 | Discharge: 2023-01-26 | Disposition: A | Discharge status: Home / Self Care | Payer: Medicare Other | Source: Ambulatory Visit | Attending: Family Medicine | Admitting: Family Medicine

## 2023-01-26 DIAGNOSIS — R110 Nausea: Secondary | ICD-10-CM | POA: Diagnosis not present

## 2023-01-27 ENCOUNTER — Encounter: Payer: Self-pay | Admitting: Family Medicine

## 2023-01-27 NOTE — Telephone Encounter (Signed)
Patient informed-see results note. 

## 2023-02-03 DIAGNOSIS — K080 Exfoliation of teeth due to systemic causes: Secondary | ICD-10-CM | POA: Diagnosis not present

## 2023-02-11 ENCOUNTER — Inpatient Hospital Stay (HOSPITAL_COMMUNITY)
Admission: EM | Admit: 2023-02-11 | Discharge: 2023-02-13 | DRG: 280 | Disposition: A | Discharge status: Home / Self Care | Payer: Medicare Other | Attending: Internal Medicine | Admitting: Internal Medicine

## 2023-02-11 ENCOUNTER — Other Ambulatory Visit: Payer: Self-pay

## 2023-02-11 ENCOUNTER — Emergency Department (HOSPITAL_COMMUNITY): Payer: Medicare Other

## 2023-02-11 ENCOUNTER — Encounter (HOSPITAL_COMMUNITY): Payer: Self-pay

## 2023-02-11 DIAGNOSIS — R7982 Elevated C-reactive protein (CRP): Secondary | ICD-10-CM | POA: Diagnosis not present

## 2023-02-11 DIAGNOSIS — E785 Hyperlipidemia, unspecified: Secondary | ICD-10-CM | POA: Diagnosis not present

## 2023-02-11 DIAGNOSIS — I161 Hypertensive emergency: Secondary | ICD-10-CM | POA: Diagnosis present

## 2023-02-11 DIAGNOSIS — N1831 Chronic kidney disease, stage 3a: Secondary | ICD-10-CM | POA: Diagnosis present

## 2023-02-11 DIAGNOSIS — I2510 Atherosclerotic heart disease of native coronary artery without angina pectoris: Secondary | ICD-10-CM | POA: Diagnosis present

## 2023-02-11 DIAGNOSIS — Z6841 Body Mass Index (BMI) 40.0 and over, adult: Secondary | ICD-10-CM

## 2023-02-11 DIAGNOSIS — I5031 Acute diastolic (congestive) heart failure: Secondary | ICD-10-CM | POA: Diagnosis not present

## 2023-02-11 DIAGNOSIS — Z83438 Family history of other disorder of lipoprotein metabolism and other lipidemia: Secondary | ICD-10-CM

## 2023-02-11 DIAGNOSIS — Z91148 Patient's other noncompliance with medication regimen for other reason: Secondary | ICD-10-CM | POA: Diagnosis not present

## 2023-02-11 DIAGNOSIS — F418 Other specified anxiety disorders: Secondary | ICD-10-CM | POA: Diagnosis present

## 2023-02-11 DIAGNOSIS — Z8249 Family history of ischemic heart disease and other diseases of the circulatory system: Secondary | ICD-10-CM

## 2023-02-11 DIAGNOSIS — N179 Acute kidney failure, unspecified: Secondary | ICD-10-CM | POA: Diagnosis present

## 2023-02-11 DIAGNOSIS — F419 Anxiety disorder, unspecified: Secondary | ICD-10-CM | POA: Diagnosis not present

## 2023-02-11 DIAGNOSIS — E039 Hypothyroidism, unspecified: Secondary | ICD-10-CM | POA: Diagnosis not present

## 2023-02-11 DIAGNOSIS — Z79899 Other long term (current) drug therapy: Secondary | ICD-10-CM | POA: Diagnosis not present

## 2023-02-11 DIAGNOSIS — F32A Depression, unspecified: Secondary | ICD-10-CM | POA: Diagnosis present

## 2023-02-11 DIAGNOSIS — I214 Non-ST elevation (NSTEMI) myocardial infarction: Secondary | ICD-10-CM | POA: Diagnosis not present

## 2023-02-11 DIAGNOSIS — K219 Gastro-esophageal reflux disease without esophagitis: Secondary | ICD-10-CM | POA: Diagnosis present

## 2023-02-11 DIAGNOSIS — I130 Hypertensive heart and chronic kidney disease with heart failure and stage 1 through stage 4 chronic kidney disease, or unspecified chronic kidney disease: Secondary | ICD-10-CM | POA: Diagnosis not present

## 2023-02-11 DIAGNOSIS — I10 Essential (primary) hypertension: Secondary | ICD-10-CM | POA: Diagnosis not present

## 2023-02-11 DIAGNOSIS — F17290 Nicotine dependence, other tobacco product, uncomplicated: Secondary | ICD-10-CM | POA: Diagnosis not present

## 2023-02-11 DIAGNOSIS — I252 Old myocardial infarction: Secondary | ICD-10-CM

## 2023-02-11 DIAGNOSIS — R079 Chest pain, unspecified: Secondary | ICD-10-CM | POA: Diagnosis not present

## 2023-02-11 LAB — CBC WITH DIFFERENTIAL/PLATELET
Abs Immature Granulocytes: 0.11 10*3/uL — ABNORMAL HIGH (ref 0.00–0.07)
Basophils Absolute: 0.1 10*3/uL (ref 0.0–0.1)
Basophils Relative: 1 %
Eosinophils Absolute: 0.1 10*3/uL (ref 0.0–0.5)
Eosinophils Relative: 2 %
HCT: 48.6 % (ref 39.0–52.0)
Hemoglobin: 16.4 g/dL (ref 13.0–17.0)
Immature Granulocytes: 1 %
Lymphocytes Relative: 14 %
Lymphs Abs: 1.3 10*3/uL (ref 0.7–4.0)
MCH: 33.8 pg (ref 26.0–34.0)
MCHC: 33.7 g/dL (ref 30.0–36.0)
MCV: 100.2 fL — ABNORMAL HIGH (ref 80.0–100.0)
Monocytes Absolute: 1 10*3/uL (ref 0.1–1.0)
Monocytes Relative: 11 %
Neutro Abs: 6.9 10*3/uL (ref 1.7–7.7)
Neutrophils Relative %: 71 %
Platelets: 276 10*3/uL (ref 150–400)
RBC: 4.85 MIL/uL (ref 4.22–5.81)
RDW: 14.7 % (ref 11.5–15.5)
WBC: 9.6 10*3/uL (ref 4.0–10.5)
nRBC: 0 % (ref 0.0–0.2)

## 2023-02-11 LAB — PROTIME-INR
INR: 1 (ref 0.8–1.2)
Prothrombin Time: 13.3 seconds (ref 11.4–15.2)

## 2023-02-11 LAB — HEMOGLOBIN A1C
Hgb A1c MFr Bld: 5.2 % (ref 4.8–5.6)
Mean Plasma Glucose: 102.54 mg/dL

## 2023-02-11 LAB — MRSA NEXT GEN BY PCR, NASAL: MRSA by PCR Next Gen: NOT DETECTED

## 2023-02-11 LAB — LIPID PANEL
Cholesterol: 186 mg/dL (ref 0–200)
HDL: 34 mg/dL — ABNORMAL LOW (ref >40)
LDL Cholesterol: 135 mg/dL — ABNORMAL HIGH (ref 0–99)
Total CHOL/HDL Ratio: 5.5 RATIO
Triglycerides: 87 mg/dL (ref <150)
VLDL: 17 mg/dL (ref 0–40)

## 2023-02-11 LAB — I-STAT CHEM 8, ED
BUN: 10 mg/dL (ref 8–23)
Calcium, Ion: 1.23 mmol/L (ref 1.15–1.40)
Chloride: 100 mmol/L (ref 98–111)
Creatinine, Ser: 1.7 mg/dL — ABNORMAL HIGH (ref 0.61–1.24)
Glucose, Bld: 118 mg/dL — ABNORMAL HIGH (ref 70–99)
HCT: 50 % (ref 39.0–52.0)
Hemoglobin: 17 g/dL (ref 13.0–17.0)
Potassium: 4.3 mmol/L (ref 3.5–5.1)
Sodium: 137 mmol/L (ref 135–145)
TCO2: 27 mmol/L (ref 22–32)

## 2023-02-11 LAB — HEPATIC FUNCTION PANEL
ALT: 24 U/L (ref 0–44)
AST: 27 U/L (ref 15–41)
Albumin: 3.8 g/dL (ref 3.5–5.0)
Alkaline Phosphatase: 65 U/L (ref 38–126)
Bilirubin, Direct: 0.2 mg/dL (ref 0.0–0.2)
Indirect Bilirubin: 0.9 mg/dL (ref 0.3–0.9)
Total Bilirubin: 1.1 mg/dL (ref 0.3–1.2)
Total Protein: 7.7 g/dL (ref 6.5–8.1)

## 2023-02-11 LAB — TROPONIN I (HIGH SENSITIVITY)
Troponin I (High Sensitivity): 33 ng/L — ABNORMAL HIGH (ref <18)
Troponin I (High Sensitivity): 300 ng/L (ref <18)
Troponin I (High Sensitivity): 1077 ng/L (ref <18)
Troponin I (High Sensitivity): 1923 ng/L (ref <18)
Troponin I (High Sensitivity): 3505 ng/L (ref <18)
Troponin I (High Sensitivity): 5928 ng/L (ref <18)

## 2023-02-11 LAB — BASIC METABOLIC PANEL
Anion gap: 8 (ref 5–15)
BUN: 12 mg/dL (ref 8–23)
CO2: 26 mmol/L (ref 22–32)
Calcium: 9.2 mg/dL (ref 8.9–10.3)
Chloride: 101 mmol/L (ref 98–111)
Creatinine, Ser: 1.72 mg/dL — ABNORMAL HIGH (ref 0.61–1.24)
GFR, Estimated: 43 mL/min — ABNORMAL LOW (ref >60)
Glucose, Bld: 123 mg/dL — ABNORMAL HIGH (ref 70–99)
Potassium: 4.3 mmol/L (ref 3.5–5.1)
Sodium: 135 mmol/L (ref 135–145)

## 2023-02-11 LAB — ECHOCARDIOGRAM COMPLETE
AR max vel: 2.78 cm2
AV Area VTI: 2.77 cm2
AV Area mean vel: 2.62 cm2
AV Mean grad: 5 mmHg
AV Peak grad: 9.2 mmHg
Ao pk vel: 1.52 m/s
Area-P 1/2: 3.74 cm2
S' Lateral: 2.8 cm

## 2023-02-11 LAB — MAGNESIUM: Magnesium: 1.7 mg/dL (ref 1.7–2.4)

## 2023-02-11 LAB — HEPARIN LEVEL (UNFRACTIONATED): Heparin Unfractionated: 0.13 IU/mL — ABNORMAL LOW (ref 0.30–0.70)

## 2023-02-11 LAB — CBG MONITORING, ED: Glucose-Capillary: 117 mg/dL — ABNORMAL HIGH (ref 70–99)

## 2023-02-11 LAB — LIPASE, BLOOD: Lipase: 39 U/L (ref 11–51)

## 2023-02-11 MED ORDER — LIDOCAINE VISCOUS HCL 2 % MT SOLN
15.0000 mL | Freq: Once | OROMUCOSAL | Status: AC
  Administered 2023-02-11: 15 mL via ORAL
  Filled 2023-02-11: qty 15

## 2023-02-11 MED ORDER — ALBUTEROL SULFATE (2.5 MG/3ML) 0.083% IN NEBU: 2.5000 mg | INHALATION_SOLUTION | RESPIRATORY_TRACT | Status: DC | PRN

## 2023-02-11 MED ORDER — ONDANSETRON HCL 4 MG PO TABS: 4.0000 mg | ORAL_TABLET | Freq: Four times a day (QID) | ORAL | Status: DC | PRN

## 2023-02-11 MED ORDER — PERFLUTREN LIPID MICROSPHERE
1.0000 mL | INTRAVENOUS | Status: AC | PRN
  Administered 2023-02-11: 3 mL via INTRAVENOUS

## 2023-02-11 MED ORDER — HEPARIN (PORCINE) 25000 UT/250ML-% IV SOLN
1800.0000 [IU]/h | INTRAVENOUS | Status: DC
  Administered 2023-02-11: 1200 [IU]/h via INTRAVENOUS
  Administered 2023-02-12: 1500 [IU]/h via INTRAVENOUS
  Filled 2023-02-11 (×3): qty 250

## 2023-02-11 MED ORDER — ASPIRIN 81 MG PO CHEW
324.0000 mg | CHEWABLE_TABLET | Freq: Once | ORAL | Status: AC
  Administered 2023-02-11: 324 mg via ORAL
  Filled 2023-02-11: qty 4

## 2023-02-11 MED ORDER — AMLODIPINE BESYLATE 5 MG PO TABS
5.0000 mg | ORAL_TABLET | Freq: Every day | ORAL | Status: DC
  Administered 2023-02-11: 5 mg via ORAL
  Filled 2023-02-11: qty 1

## 2023-02-11 MED ORDER — ATORVASTATIN CALCIUM 40 MG PO TABS
20.0000 mg | ORAL_TABLET | Freq: Every day | ORAL | Status: DC
  Administered 2023-02-11 – 2023-02-13 (×2): 20 mg via ORAL
  Filled 2023-02-11: qty 1
  Filled 2023-02-11: qty 2

## 2023-02-11 MED ORDER — ORAL CARE MOUTH RINSE: 15.0000 mL | OROMUCOSAL | Status: DC | PRN

## 2023-02-11 MED ORDER — TRAZODONE HCL 50 MG PO TABS: 25.0000 mg | ORAL_TABLET | Freq: Every evening | ORAL | Status: DC | PRN

## 2023-02-11 MED ORDER — ACETAMINOPHEN 650 MG RE SUPP: 650.0000 mg | Freq: Four times a day (QID) | RECTAL | Status: DC | PRN

## 2023-02-11 MED ORDER — ALPRAZOLAM 0.5 MG PO TABS
1.0000 mg | ORAL_TABLET | Freq: Once | ORAL | Status: AC
  Administered 2023-02-11: 1 mg via ORAL
  Filled 2023-02-11: qty 2

## 2023-02-11 MED ORDER — POTASSIUM CHLORIDE IN NACL 20-0.9 MEQ/L-% IV SOLN
INTRAVENOUS | Status: DC
  Filled 2023-02-11 (×3): qty 1000

## 2023-02-11 MED ORDER — NITROGLYCERIN 0.4 MG SL SUBL
0.4000 mg | SUBLINGUAL_TABLET | SUBLINGUAL | Status: AC | PRN
  Administered 2023-02-11 – 2023-02-12 (×3): 0.4 mg via SUBLINGUAL
  Filled 2023-02-11 (×2): qty 1

## 2023-02-11 MED ORDER — ASPIRIN 81 MG PO CHEW: 81.0000 mg | CHEWABLE_TABLET | Freq: Every day | ORAL | Status: DC

## 2023-02-11 MED ORDER — FENTANYL CITRATE PF 50 MCG/ML IJ SOSY
50.0000 ug | PREFILLED_SYRINGE | Freq: Once | INTRAMUSCULAR | Status: AC
  Administered 2023-02-11: 50 ug via INTRAVENOUS
  Filled 2023-02-11: qty 1

## 2023-02-11 MED ORDER — ALPRAZOLAM 0.5 MG PO TABS
1.0000 mg | ORAL_TABLET | Freq: Four times a day (QID) | ORAL | Status: DC | PRN
  Administered 2023-02-11: 1 mg via ORAL
  Filled 2023-02-11: qty 2

## 2023-02-11 MED ORDER — MORPHINE SULFATE (PF) 2 MG/ML IV SOLN
1.0000 mg | INTRAVENOUS | Status: AC | PRN
  Administered 2023-02-12: 1 mg via INTRAVENOUS
  Filled 2023-02-11: qty 1

## 2023-02-11 MED ORDER — SENNOSIDES-DOCUSATE SODIUM 8.6-50 MG PO TABS: 1.0000 | ORAL_TABLET | Freq: Every evening | ORAL | Status: DC | PRN

## 2023-02-11 MED ORDER — ONDANSETRON HCL 4 MG/2ML IJ SOLN
4.0000 mg | Freq: Four times a day (QID) | INTRAMUSCULAR | Status: DC | PRN
  Administered 2023-02-12: 4 mg via INTRAVENOUS

## 2023-02-11 MED ORDER — CARVEDILOL 12.5 MG PO TABS
12.5000 mg | ORAL_TABLET | Freq: Two times a day (BID) | ORAL | Status: DC
  Administered 2023-02-11: 12.5 mg via ORAL
  Filled 2023-02-11: qty 1

## 2023-02-11 MED ORDER — ALUM & MAG HYDROXIDE-SIMETH 200-200-20 MG/5ML PO SUSP
30.0000 mL | Freq: Once | ORAL | Status: AC
  Administered 2023-02-11: 30 mL via ORAL
  Filled 2023-02-11: qty 30

## 2023-02-11 MED ORDER — ACETAMINOPHEN 325 MG PO TABS: 650.0000 mg | ORAL_TABLET | Freq: Four times a day (QID) | ORAL | Status: DC | PRN

## 2023-02-11 MED ORDER — METOCLOPRAMIDE HCL 5 MG/ML IJ SOLN
10.0000 mg | Freq: Once | INTRAMUSCULAR | Status: AC
  Administered 2023-02-11: 10 mg via INTRAVENOUS
  Filled 2023-02-11: qty 2

## 2023-02-11 MED ORDER — ONDANSETRON HCL 4 MG/2ML IJ SOLN
4.0000 mg | Freq: Once | INTRAMUSCULAR | Status: AC
  Administered 2023-02-11: 4 mg via INTRAVENOUS
  Filled 2023-02-11: qty 2

## 2023-02-11 MED ORDER — CHLORHEXIDINE GLUCONATE CLOTH 2 % EX PADS
6.0000 | MEDICATED_PAD | Freq: Every day | CUTANEOUS | Status: DC
  Administered 2023-02-11: 6 via TOPICAL

## 2023-02-11 MED ORDER — FAMOTIDINE IN NACL 20-0.9 MG/50ML-% IV SOLN
20.0000 mg | Freq: Once | INTRAVENOUS | Status: AC
  Administered 2023-02-11: 20 mg via INTRAVENOUS
  Filled 2023-02-11: qty 50

## 2023-02-11 MED ORDER — HEPARIN BOLUS VIA INFUSION
2800.0000 [IU] | Freq: Once | INTRAVENOUS | Status: AC
  Administered 2023-02-11: 2800 [IU] via INTRAVENOUS
  Filled 2023-02-11: qty 2800

## 2023-02-11 MED ORDER — PNEUMOCOCCAL 20-VAL CONJ VACC 0.5 ML IM SUSY
0.5000 mL | PREFILLED_SYRINGE | INTRAMUSCULAR | Status: DC
  Filled 2023-02-11: qty 0.5

## 2023-02-11 MED ORDER — NITROGLYCERIN 2 % TD OINT
1.0000 [in_us] | TOPICAL_OINTMENT | Freq: Once | TRANSDERMAL | Status: AC
  Administered 2023-02-11: 1 [in_us] via TOPICAL
  Filled 2023-02-11: qty 1

## 2023-02-11 MED ORDER — HEPARIN BOLUS VIA INFUSION
4000.0000 [IU] | Freq: Once | INTRAVENOUS | Status: AC
  Administered 2023-02-11: 4000 [IU] via INTRAVENOUS
  Filled 2023-02-11: qty 4000

## 2023-02-11 NOTE — H&P (View-Only) (Signed)
Cardiology Consultation   Patient ID: EULOJIO SCALZO MRN: [id number]; DOB: 1956-08-15  Admit date: 02/11/2023 Date of Consult: 02/11/2023  PCP:  Nelwyn Salisbury, MD   [facility] Providers Cardiologist:  None   {:1  Patient Profile:   Richard Sellers is a 67 y.o. male with a hx of CKD, hyperlipidemia, hypertension, hypothyroidism, GERD who is being seen 02/11/2023 for the evaluation of chest pain at the request of Dr. Durwin Nora.  History of Present Illness:   Richard Sellers has no prior cardiac history.  Patient was recently seen on Jan 23, 2023 by his primary care provider with complaints of right upper quadrant pain, nausea.  He had a right upper quadrant ultrasound that was negative for cholecystitis or cholelithiasis. Notes state that his nausea is a recurrent problem. Has history of a previously normal echocardiogram with LVEF of 60 to 65% in 2021.  Since May 3 patient has had ongoing complaints of right upper quadrant and epigastric pain associated with nausea.  He states that last night at approximately 1 AM unclear if it woke him up for sleep or not. He describes the pain as a consistent heaviness and mild discomfort.  He states that he has accompanying nausea but denies any radiation or shortness of breath with the pain.  This pain is nonexertional, has never happened before, reproducible, persistent and has been lasting since this morning at 1 AM.  Aside from this 1 episode patient has not had prior history of chest pain.  Patient is not diabetic, does not smoke, does not have significant family history of cardiac disease.  He did mention a mother who may have died of a heart attack in her 24s.  Patient denies any peripheral edema, palpitations, shortness of breath, palpitations, orthopnea. 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  Also has been on Testerone for 7-8 years.  Chest x-ray negative.  EKG showing normal sinus rhythm, heart rate 83.   Some minimal ST depression in lateral leads). No acute ischemic changes. Diffuse nonspecific T wave changes. Troponins 33-300. No other significant labs or imaging. He's been given aspirin load, nitro, fentanyl and been started on heparin.  Past Medical History:  Diagnosis Date   Allergy    seasonal allergies   Anxiety    on meds   Chronic kidney disease    hx of kidney stones   Depression    on meds   ED (erectile dysfunction)    GERD (gastroesophageal reflux disease)    not on meds-diet controlled-uses OTC meds for tx   Heart murmur    at age 89 years old, heard x1 , never again   History of kidney stones    Hyperlipidemia    on meds   Hypertension    on meds   Hypothyroidism    hx of-was on meds-   PONV (postoperative nausea and vomiting)    Warts, genital     Past Surgical History:  Procedure Laterality Date   APPENDECTOMY  1975   COLONOSCOPY  11/08/2007   per Dr. Jarold Motto, diverticulosis but no polyps, repeat in 10 yrs    CYSTECTOMY     EXTRACORPOREAL SHOCK WAVE LITHOTRIPSY Left 10/29/2017   Procedure: LEFT EXTRACORPOREAL SHOCK WAVE LITHOTRIPSY (ESWL);  Surgeon: Marcine Matar, MD;  Location: [facility];  Service: Urology;  Laterality: Left;   HERNIA REPAIR  07/2017   umbilical hernia   KNEE ARTHROSCOPY Right 1993  TONSILLECTOMY     WISDOM TOOTH EXTRACTION  1990   Inpatient Medications: Scheduled Meds:  Continuous Infusions:  heparin 1,200 Units/hr (02/11/23 1105)   PRN Meds: perflutren lipid microspheres (DEFINITY) IV suspension  Allergies:   No Known Allergies  Social History:   Social History   Socioeconomic History   Marital status: Divorced    Spouse name: Not on file   Number of children: Not on file   Years of education: Not on file   Highest education level: Not on file  Occupational History   Not on file  Tobacco Use   Smoking status: Former    Types: Cigarettes    Quit date: 09/22/2002    Years since quitting: 20.4   Smokeless tobacco:  Never  Vaping Use   Vaping Use: Every day  Substance and Sexual Activity   Alcohol use: Yes    Alcohol/week: 28.0 standard drinks of alcohol    Types: 7 Standard drinks or equivalent, 21 Cans of beer per week    Comment: 2-3 beers per day   Drug use: No   Sexual activity: Not on file  Other Topics Concern   Not on file  Social History Narrative   Not on file   Social Determinants of Health   Financial Resource Strain: Not on file  Food Insecurity: Not on file  Transportation Needs: Not on file  Physical Activity: Not on file  Stress: Not on file  Social Connections: Not on file  Intimate Partner Violence: Not on file    Family History:   Family History  Problem Relation Age of Onset   Hypertension Other        family hx   Hyperlipidemia Other        family hx   Colon polyps Neg Hx    Colon cancer Neg Hx    Esophageal cancer Neg Hx    Stomach cancer Neg Hx    Rectal cancer Neg Hx      ROS:  Please see the history of present illness.  All other ROS reviewed and negative.     Physical Exam/Data:   Vitals:   02/11/23 0800 02/11/23 0815 02/11/23 0945 02/11/23 1252  BP: (!) 153/109 (!) 126/97 (!) 158/99 120/77  Pulse: 96 100 94 87  Resp: (!) 24  (!) 21 19  Temp:   98.8 F (37.1 C) 98.6 F (37 C)  TempSrc:    Oral  SpO2: 93% 91% 91% 93%  Weight:      Height:       No intake or output data in the 24 hours ending 02/11/23 1308    02/11/2023    7:05 AM  Last 3 Weights  Weight (lbs) 265 lb  Weight (kg) 120.203 kg     Body mass index is 41.5 kg/m.  General:  Well nourished, well developed, in no acute distress HEENT: normal Neck: no JVD Vascular: No carotid bruits; Distal pulses 2+ bilaterally Cardiac:  normal S1, S2; RRR; no murmur. Tenderness to palpation. Lungs:  clear to auscultation bilaterally, no wheezing, rhonchi or rales  Abd: + RUQ and epigastric pain. Ext: no edema Musculoskeletal:  No deformities, BUE and BLE strength normal and equal Skin:  warm and dry  Neuro:  CNs 2-12 intact, no focal abnormalities noted Psych:  Normal affect   EKG:  The EKG was personally reviewed and demonstrates:  EKG showing normal sinus rhythm, heart rate 83.  No acute ischemic changes.Some minimal ST depression in lateral leads). Diffuse  nonspecific T wave changes Telemetry:  Telemetry was personally reviewed and demonstrates:  not hooked up on telemetry.  Relevant CV Studies:   Laboratory Data:  High Sensitivity Troponin:   Recent Labs  Lab 02/11/23 0710 02/11/23 0910 02/11/23 1110  TROPONINIHS 33* 300* 1,077*     Chemistry Recent Labs  Lab 02/11/23 0710 02/11/23 0727  NA 135 137  K 4.3 4.3  CL 101 100  CO2 26  --   GLUCOSE 123* 118*  BUN 12 10  CREATININE 1.72* 1.70*  CALCIUM 9.2  --   MG 1.7  --   GFRNONAA 43*  --   ANIONGAP 8  --     Recent Labs  Lab 02/11/23 0710  PROT 7.7  ALBUMIN 3.8  AST 27  ALT 24  ALKPHOS 65  BILITOT 1.1   Lipids  Recent Labs  Lab 02/11/23 0710  CHOL 186  TRIG 87  HDL 34*  LDLCALC 135*  CHOLHDL 5.5    Hematology Recent Labs  Lab 02/11/23 0710 02/11/23 0727  WBC 9.6  --   RBC 4.85  --   HGB 16.4 17.0  HCT 48.6 50.0  MCV 100.2*  --   MCH 33.8  --   MCHC 33.7  --   RDW 14.7  --   PLT 276  --    Thyroid No results for input(s): "TSH", "FREET4" in the last 168 hours.  BNPNo results for input(s): "BNP", "PROBNP" in the last 168 hours.  DDimer No results for input(s): "DDIMER" in the last 168 hours.   Radiology/Studies:  ECHOCARDIOGRAM COMPLETE  Result Date: 02/11/2023    ECHOCARDIOGRAM REPORT   Patient Name:   DORSEY HAROLDSON Date of Exam: 02/11/2023 Medical Rec #:  [id number]        Height:       67.0 in Accession #:    [id number]       Weight:       265.0 lb Date of Birth:  April 25, 1956       BSA:          2.279 m Patient Age:    27 years         BP:           158/99 mmHg Patient Gender: M                HR:           91 bpm. Exam Location:  Inpatient Procedure: 2D Echo, Color  Doppler, Cardiac Doppler and Intracardiac            Opacification Agent STAT ECHO Indications:    Chest Pain  History:        Patient has prior history of Echocardiogram examinations, most                 recent 08/27/2020. Risk Factors:Hypertension.  Sonographer:    Milbert Coulter Referring Phys: [id number] SHENG L HALEY  Sonographer Comments: Technically difficult study due to poor echo windows, suboptimal apical window, suboptimal parasternal window and patient is obese. Image acquisition challenging due to patient body habitus. IMPRESSIONS  1. Left ventricular ejection fraction, by estimation, is 55 to 60%. The left ventricle has normal function. The left ventricle demonstrates regional wall motion abnormalities (see scoring diagram/findings for description). There is mild left ventricular  hypertrophy. Left ventricular diastolic parameters are consistent with Grade I diastolic dysfunction (impaired relaxation).  2. Right ventricular systolic function is normal. The right ventricular size is normal.  3.  Left atrial size was mildly dilated.  4. Right atrial size was mildly dilated.  5. The mitral valve is normal in structure. No evidence of mitral valve regurgitation. No evidence of mitral stenosis.  6. The aortic valve is normal in structure. Aortic valve regurgitation is not visualized. No aortic stenosis is present.  7. The inferior vena cava is normal in size with greater than 50% respiratory variability, suggesting right atrial pressure of 3 mmHg. FINDINGS  Left Ventricle: Left ventricular ejection fraction, by estimation, is 55 to 60%. The left ventricle has normal function. The left ventricle demonstrates regional wall motion abnormalities. Definity contrast agent was given IV to delineate the left ventricular endocardial borders. The left ventricular internal cavity size was normal in size. There is mild left ventricular hypertrophy. Left ventricular diastolic parameters are consistent with Grade I diastolic  dysfunction (impaired relaxation).  LV Wall Scoring: The apical anterior segment is akinetic. Right Ventricle: The right ventricular size is normal. No increase in right ventricular wall thickness. Right ventricular systolic function is normal. Left Atrium: Left atrial size was mildly dilated. Right Atrium: Right atrial size was mildly dilated. Pericardium: There is no evidence of pericardial effusion. Mitral Valve: The mitral valve is normal in structure. No evidence of mitral valve regurgitation. No evidence of mitral valve stenosis. Tricuspid Valve: The tricuspid valve is normal in structure. Tricuspid valve regurgitation is not demonstrated. No evidence of tricuspid stenosis. Aortic Valve: The aortic valve is normal in structure. Aortic valve regurgitation is not visualized. No aortic stenosis is present. Aortic valve mean gradient measures 5.0 mmHg. Aortic valve peak gradient measures 9.2 mmHg. Aortic valve area, by VTI measures 2.77 cm. Pulmonic Valve: The pulmonic valve was normal in structure. Pulmonic valve regurgitation is not visualized. No evidence of pulmonic stenosis. Aorta: The aortic root is normal in size and structure. Venous: The inferior vena cava is normal in size with greater than 50% respiratory variability, suggesting right atrial pressure of 3 mmHg. IAS/Shunts: No atrial level shunt detected by color flow Doppler.  LEFT VENTRICLE PLAX 2D LVIDd:         4.00 cm   Diastology LVIDs:         2.80 cm   LV e' lateral:   7.94 cm/s LV PW:         1.40 cm   LV E/e' lateral: 7.7 LV IVS:        1.30 cm LVOT diam:     2.20 cm LV SV:         70 LV SV Index:   31 LVOT Area:     3.80 cm  RIGHT VENTRICLE RV S prime:     17.40 cm/s TAPSE (M-mode): 2.7 cm LEFT ATRIUM             Index        RIGHT ATRIUM           Index LA diam:        3.60 cm 1.58 cm/m   RA Area:     23.50 cm LA Vol (A2C):   74.1 ml 32.51 ml/m  RA Volume:   65.70 ml  28.83 ml/m LA Vol (A4C):   78.9 ml 34.62 ml/m LA Biplane Vol: 78.6  ml 34.49 ml/m  AORTIC VALVE AV Area (Vmax):    2.78 cm AV Area (Vmean):   2.62 cm AV Area (VTI):     2.77 cm AV Vmax:           152.00 cm/s  AV Vmean:          100.000 cm/s AV VTI:            0.251 m AV Peak Grad:      9.2 mmHg AV Mean Grad:      5.0 mmHg LVOT Vmax:         111.00 cm/s LVOT Vmean:        69.000 cm/s LVOT VTI:          0.183 m LVOT/AV VTI ratio: 0.73  AORTA Ao Root diam: 3.60 cm Ao Asc diam:  3.60 cm MITRAL VALVE MV Area (PHT): 3.74 cm    SHUNTS MV Decel Time: 203 msec    Systemic VTI:  0.18 m MV E velocity: 61.30 cm/s  Systemic Diam: 2.20 cm MV A velocity: 89.10 cm/s MV E/A ratio:  0.69 Donato Schultz MD Electronically signed by Donato Schultz MD Signature Date/Time: 02/11/2023/11:39:11 AM    Final    DG Chest Portable 1 View  Result Date: 02/11/2023 CLINICAL DATA:  Central chest pain beginning 0130 hours EXAM: PORTABLE CHEST 1 VIEW COMPARISON:  04/04/2022 FINDINGS: Artifact overlies the chest. Heart size upper limits of normal. Chronic aortic atherosclerotic calcification. Upper lungs are clear. No pulmonary edema. Lung bases are poorly seen due portable nature of the film and patient positioning. Consider two-view chest radiography if concern persists regarding the potential for basilar lung disease. No regional bone abnormality. IMPRESSION: No active disease suspected. Lung bases poorly seen due to portable nature of the film and patient positioning. Consider two-view chest radiography if concern persists. Electronically Signed   By: Paulina Fusi M.D.   On: 02/11/2023 07:59     Assessment and Plan:   Atypical chest pain with elevated troponins Patient presenting with 1st time occurrence of nonexertional, non radiating, reproducible chest pain with other complaints of nausea that has been going on for the past few weeks.  EKG not showing any ischemic changes (some minimal ST depression in lateral leads).  Troponin have increased from 33-300. He has been started on heparin. Will plan for  cardiac catherization.   Shared Decision Making/Informed Consent{ The risks [stroke (1 in 1000), death (1 in 1000), kidney failure [usually temporary] (1 in 500), bleeding (1 in 200), allergic reaction [possibly serious] (1 in 200)], benefits (diagnostic support and management of coronary artery disease) and alternatives of a cardiac catheterization were discussed in detail with Richard Sellers and he is willing to proceed.   Ordering STAT echo.  Will trend trops  Has been given aspirin load Will repeat EKG  Will order A1c   HTN  PTA carvedilol 12.5mg  BID (states he takes once a day), losartan-hydrochlorothiazide (HYZAAR) 100-25 MG tablet (hold now prior to cath, can resume after), amlodipine 5mg . Has not had any of his medications today.  CKD Cr 1.7, GFR 43  HLD Not on statin. Will order lipid panel.   Risk Assessment/Risk Scores:    For questions or updates, please contact [facility] Please consult [email or web address] for contact info under    Signed, Thomasene Ripple, DO  02/11/2023 1:08 PM   Patient seen and examined, note reviewed with the signed Advanced Practice Provider. I personally reviewed laboratory data, imaging studies and relevant notes. I independently examined the patient and formulated the important aspects of the plan. I have personally discussed the plan with the patient and/or family. Comments or changes to the note/plan are indicated below.  Patient seen examined his bedside in the emergency department was in  the hospital.  He tells me that he woke up in the middle of the night given the fact that he was experiencing significant chest discomfort more pressure-like sensation.  No radiation but associated nausea and vomiting.  He waited for few minutes hoping that this will improve and it did not.  He was therefore concerned and decided to come to the emergency department to be evaluated.  NSTEMI,Chest discomfort concerning for angina in the setting of elevated  troponin Hypertension,  hyperlipidemia,  CKD Morbid obesity   After speaking with the patient he may have multiple pain which 1 is atypical and reproducible however his pain did recently occur with the chest heaviness overnight waking him up from sleep that is still persistent despite the heparin drip makes me highly suspicious for anginal equivalent.  He is intermediate risk for coronary atherosclerosis therefore I would like to proceed with a ischemic evaluation in this patient.  Ideally heart catheterization would be appropriate.  Start aspirin 81 mg daily, start Lipitor 20 mg daily. Continue his carvedilol 12.5 mg twice daily, continue his amlodipine 5 mg daily. Hold for now on the losartan and his hydrochlorothiazide we can restart that post heart catheterization tomorrow. Please get an echocardiogram to assess his LV function. He will benefit from getting hemoglobin A1c as well as lipid profile while he is in the hospital. He is very anxious he takes daily Xanax will defer to the primary team for restarting this.  The patient understands that risks include but are not limited to stroke (1 in 1000), death (1 in 1000), kidney failure [usually temporary] (1 in 500), bleeding (1 in 200), allergic reaction [possibly serious] (1 in 200), and agrees to proceed.   Thomasene Ripple DO, MS [facility] Attending Cardiologist [facility]  [address] [place], [place] [address] [phone] Website: [email or web address]

## 2023-02-11 NOTE — Progress Notes (Signed)
Pt c/o epigastric pain 7/10 that radiates to left lower chest, describing it as a combination of aching and pressure. He is scheduled for cardiac cath in the morning, troponin elevated. BP 149/103, HR 80, SpO2 95 on RA, RR 17. Pt also c/o anxiety, xanax given earlier.  Virgel Manifold, NP notified. New orders for nitro placed. Pt placed on 2L of O2 via nasal cannula for comfort BP 154/100, first nitro given, pain remains at 7/10. BP 144/92, second nitro given, pain 5/10 and decreasing per pt. BP 126/74, pt denied third dose of nitro, pain has resolved.  EKG completed, Virgel Manifold, NP notified.  Pt resting comfortably in bed, no signs of distress, no chest pain, no SOB. WIll continue to monitor pt closely.

## 2023-02-11 NOTE — ED Notes (Signed)
CRITICAL VALUE STICKER  CRITICAL VALUE: Troponin 300  RECEIVER (on-site recipient of call):  DATE & TIME NOTIFIED: 1004, 02/11/2023  MESSENGER (representative from lab):  MD NOTIFIED: Dr. Durwin Nora  TIME OF NOTIFICATION: 1004  RESPONSE: Orders received

## 2023-02-11 NOTE — H&P (Signed)
History and Physical  Richard Sellers ZOX:[id number] DOB: April 26, 1956 DOA: 02/11/2023  PCP: Nelwyn Salisbury, MD   Chief Complaint: abd pain, nausea, chest pressure   HPI: Richard Sellers is a 67 y.o. male with medical history significant for hypertension, GERD, hypothyroidism being admitted to the hospital with NSTEMI.  Apparently the patient for the last 2 to 3 weeks has been having intermittent nausea and right upper quadrant abdominal discomfort.  He had right upper quadrant ultrasound which was unremarkable.  Recurrent is the nausea, this morning he decided to come to the ER for evaluation because of the nausea and right upper quadrant pressure and discomfort was worse than ever before.  He also had pressure across sternum.  He has no significant family history of heart disease, mother died of a heart attack in her 66s.  He denies pleuritic chest pain, sharp stabbing chest pain, shortness of breath, orthopnea.  ED Course: Evaluation in the ER revealed troponin 33, 300, 1000.  He was placed on aspirin and IV heparin drip, also placed on Nitro ointment.  Was seen by cardiology who recommends keeping the patient n.p.o., medical admission, and likely heart catheterization in the next 24 hours.  Currently the patient states that he has a small amount of pressure, it never completely went away permanently, but much better than before.  Review of Systems: Please see HPI for pertinent positives and negatives. A complete 10 system review of systems are otherwise negative.  Past Medical History:  Diagnosis Date   Allergy    seasonal allergies   Anxiety    on meds   Chronic kidney disease    hx of kidney stones   Depression    on meds   ED (erectile dysfunction)    GERD (gastroesophageal reflux disease)    not on meds-diet controlled-uses OTC meds for tx   Heart murmur    at age 42 years old, heard x1 , never again   History of kidney stones    Hyperlipidemia    on meds   Hypertension     on meds   Hypothyroidism    hx of-was on meds-   PONV (postoperative nausea and vomiting)    Warts, genital    Past Surgical History:  Procedure Laterality Date   APPENDECTOMY  1975   COLONOSCOPY  11/08/2007   per Dr. Jarold Motto, diverticulosis but no polyps, repeat in 10 yrs    CYSTECTOMY     EXTRACORPOREAL SHOCK WAVE LITHOTRIPSY Left 10/29/2017   Procedure: LEFT EXTRACORPOREAL SHOCK WAVE LITHOTRIPSY (ESWL);  Surgeon: Marcine Matar, MD;  Location: [facility];  Service: Urology;  Laterality: Left;   HERNIA REPAIR  07/2017   umbilical hernia   KNEE ARTHROSCOPY Right 1993   TONSILLECTOMY     WISDOM TOOTH EXTRACTION  1990    Social History:  reports that he quit smoking about 20 years ago. His smoking use included cigarettes. He has never used smokeless tobacco. He reports current alcohol use of about 28.0 standard drinks of alcohol per week. He reports that he does not use drugs.   No Known Allergies  Family History  Problem Relation Age of Onset   Hypertension Other        family hx   Hyperlipidemia Other        family hx   Colon polyps Neg Hx    Colon cancer Neg Hx    Esophageal cancer Neg Hx    Stomach cancer Neg Hx    Rectal cancer  Neg Hx      Prior to Admission medications   Medication Sig Start Date End Date Taking? Authorizing Provider  ALPRAZolam Prudy Feeler) 1 MG tablet Take 1 tablet (1 mg total) by mouth every 6 (six) hours as needed for anxiety. for anxiety 11/28/22  Yes Nelwyn Salisbury, MD  amLODipine (NORVASC) 5 MG tablet TAKE 1 TABLET BY MOUTH DAILY 01/09/23  Yes Nelwyn Salisbury, MD  carvedilol (COREG) 12.5 MG tablet TAKE 1 TABLET BY MOUTH TWICE A DAY WITH A MEAL Patient taking differently: Take 12.5 mg by mouth daily. 01/09/23  Yes Nelwyn Salisbury, MD  diclofenac (VOLTAREN) 75 MG EC tablet TAKE ONE TABLET BY MOUTH TWICE A DAY 12/31/22  Yes Nelwyn Salisbury, MD  losartan-hydrochlorothiazide [facility]) 100-25 MG tablet TAKE 1 TABLET BY MOUTH DAILY 01/09/23  Yes Nelwyn Salisbury, MD   omeprazole (PRILOSEC) 40 MG capsule TAKE 1 CAPSULE BY MOUTH DAILY 08/11/22  Yes Nelwyn Salisbury, MD  ondansetron (ZOFRAN) 4 MG tablet Take 1 tablet (4 mg total) by mouth every 8 (eight) hours as needed for nausea or vomiting. 01/23/23  Yes Karie Georges, MD  testosterone cypionate (DEPOTESTOSTERONE CYPIONATE) 200 MG/ML injection INJECT 1 ML INTRAMUSCULARLY EVERY 7 DAYS Patient taking differently: Inject 200 mg into the muscle every 7 (seven) days. 10/15/22  Yes Nelwyn Salisbury, MD  triamcinolone cream (KENALOG) 0.1 % Apply 1 Application topically 2 (two) times daily as needed. 09/03/22  Yes Nelwyn Salisbury, MD  albuterol (VENTOLIN HFA) 108 (90 Base) MCG/ACT inhaler Inhale 2 puffs into the lungs every 4 (four) hours as needed for wheezing or shortness of breath. Patient not taking: Reported on 02/11/2023 10/24/22   Nelwyn Salisbury, MD  Cholecalciferol (VITAMIN D) 125 MCG (5000 UT) CAPS Take 1 capsule by mouth once a week. Patient not taking: Reported on 02/11/2023 02/27/20   Nelwyn Salisbury, MD  dextromethorphan-guaiFENesin [facility] DM) 30-600 MG 12hr tablet Take 1 tablet by mouth 2 (two) times daily. Patient not taking: Reported on 02/11/2023 09/29/22   Karie Georges, MD  fluticasone [facility]) 50 MCG/ACT nasal spray Place 1 spray into both nostrils 2 (two) times daily. Patient not taking: Reported on 02/11/2023 03/26/22   [place], Betty G, MD  gabapentin (NEURONTIN) 300 MG capsule Take 1 capsule (300 mg total) by mouth 3 (three) times daily. Patient not taking: Reported on 02/11/2023 08/30/21   Nelwyn Salisbury, MD  halobetasol (ULTRAVATE) 0.05 % cream Apply topically 2 (two) times daily. APPLY TOPICALLY TWO TIMES A DAY Patient not taking: Reported on 02/11/2023 03/22/20   Nelwyn Salisbury, MD  montelukast (SINGULAIR) 10 MG tablet TAKE 1 TABLET BY MOUTH AT BEDTIME 04/22/22   Nelwyn Salisbury, MD  SYRINGE-NEEDLE, DISP, 3 ML (SAFETY SYRINGES/NEEDLE) 20G X 1-1/2" 3 ML MISC 1 application by Does not apply route every 14  (fourteen) days. Patient not taking: Reported on 02/11/2023 11/25/16   Nelwyn Salisbury, MD  tadalafil (CIALIS) 20 MG tablet Take 1 tablet (20 mg total) by mouth daily as needed for erectile dysfunction. Patient not taking: Reported on 02/11/2023 03/22/20   Nelwyn Salisbury, MD  temazepam (RESTORIL) 15 MG capsule TAKE TWO CAPSULES BY MOUTH EVERY NIGHT AT BEDTIME Patient not taking: Reported on 02/11/2023 07/20/20   Nelwyn Salisbury, MD    Physical Exam: BP 120/77 (BP Location: Left Arm)   Pulse 87   Temp 98.6 F (37 C) (Oral)   Resp 19   Ht 5\' 7"  (1.702 m)  Wt 120.2 kg   SpO2 93%   BMI 41.50 kg/m   General:  Alert, oriented, calm, in no acute distress  Eyes: EOMI, clear conjuctivae, white sclerea Neck: supple, no masses, trachea mildline  Cardiovascular: RRR, no murmurs or rubs, no peripheral edema  Respiratory: clear to auscultation bilaterally, no wheezes, no crackles  Abdomen: soft, nontender, nondistended, normal bowel tones heard  Skin: dry, no rashes  Musculoskeletal: no joint effusions, normal range of motion  Psychiatric: appropriate affect, normal speech  Neurologic: extraocular muscles intact, clear speech, moving all extremities with intact sensorium          Labs on Admission:  Basic Metabolic Panel: Recent Labs  Lab 02/11/23 0710 02/11/23 0727  NA 135 137  K 4.3 4.3  CL 101 100  CO2 26  --   GLUCOSE 123* 118*  BUN 12 10  CREATININE 1.72* 1.70*  CALCIUM 9.2  --   MG 1.7  --    Liver Function Tests: Recent Labs  Lab 02/11/23 0710  AST 27  ALT 24  ALKPHOS 65  BILITOT 1.1  PROT 7.7  ALBUMIN 3.8   Recent Labs  Lab 02/11/23 0710  LIPASE 39   No results for input(s): "AMMONIA" in the last 168 hours. CBC: Recent Labs  Lab 02/11/23 0710 02/11/23 0727  WBC 9.6  --   NEUTROABS 6.9  --   HGB 16.4 17.0  HCT 48.6 50.0  MCV 100.2*  --   PLT 276  --    Cardiac Enzymes: No results for input(s): "CKTOTAL", "CKMB", "CKMBINDEX", "TROPONINI" in the last 168  hours.  BNP (last 3 results) No results for input(s): "BNP" in the last 8760 hours.  ProBNP (last 3 results) No results for input(s): "PROBNP" in the last 8760 hours.  CBG: Recent Labs  Lab 02/11/23 0720  GLUCAP 117*    Radiological Exams on Admission: ECHOCARDIOGRAM COMPLETE  Result Date: 02/11/2023    ECHOCARDIOGRAM REPORT   Patient Name:   CASSEY BATESON Date of Exam: 02/11/2023 Medical Rec #:  [id number]        Height:       67.0 in Accession #:    [id number]       Weight:       265.0 lb Date of Birth:  Feb 15, 1956       BSA:          2.279 m Patient Age:    48 years         BP:           158/99 mmHg Patient Gender: M                HR:           91 bpm. Exam Location:  Inpatient Procedure: 2D Echo, Color Doppler, Cardiac Doppler and Intracardiac            Opacification Agent STAT ECHO Indications:    Chest Pain  History:        Patient has prior history of Echocardiogram examinations, most                 recent 08/27/2020. Risk Factors:Hypertension.  Sonographer:    Milbert Coulter Referring Phys: [id number] SHENG L HALEY  Sonographer Comments: Technically difficult study due to poor echo windows, suboptimal apical window, suboptimal parasternal window and patient is obese. Image acquisition challenging due to patient body habitus. IMPRESSIONS  1. Left ventricular ejection fraction, by estimation, is 55 to 60%. The left ventricle  has normal function. The left ventricle demonstrates regional wall motion abnormalities (see scoring diagram/findings for description). There is mild left ventricular  hypertrophy. Left ventricular diastolic parameters are consistent with Grade I diastolic dysfunction (impaired relaxation).  2. Right ventricular systolic function is normal. The right ventricular size is normal.  3. Left atrial size was mildly dilated.  4. Right atrial size was mildly dilated.  5. The mitral valve is normal in structure. No evidence of mitral valve regurgitation. No evidence of mitral  stenosis.  6. The aortic valve is normal in structure. Aortic valve regurgitation is not visualized. No aortic stenosis is present.  7. The inferior vena cava is normal in size with greater than 50% respiratory variability, suggesting right atrial pressure of 3 mmHg. FINDINGS  Left Ventricle: Left ventricular ejection fraction, by estimation, is 55 to 60%. The left ventricle has normal function. The left ventricle demonstrates regional wall motion abnormalities. Definity contrast agent was given IV to delineate the left ventricular endocardial borders. The left ventricular internal cavity size was normal in size. There is mild left ventricular hypertrophy. Left ventricular diastolic parameters are consistent with Grade I diastolic dysfunction (impaired relaxation).  LV Wall Scoring: The apical anterior segment is akinetic. Right Ventricle: The right ventricular size is normal. No increase in right ventricular wall thickness. Right ventricular systolic function is normal. Left Atrium: Left atrial size was mildly dilated. Right Atrium: Right atrial size was mildly dilated. Pericardium: There is no evidence of pericardial effusion. Mitral Valve: The mitral valve is normal in structure. No evidence of mitral valve regurgitation. No evidence of mitral valve stenosis. Tricuspid Valve: The tricuspid valve is normal in structure. Tricuspid valve regurgitation is not demonstrated. No evidence of tricuspid stenosis. Aortic Valve: The aortic valve is normal in structure. Aortic valve regurgitation is not visualized. No aortic stenosis is present. Aortic valve mean gradient measures 5.0 mmHg. Aortic valve peak gradient measures 9.2 mmHg. Aortic valve area, by VTI measures 2.77 cm. Pulmonic Valve: The pulmonic valve was normal in structure. Pulmonic valve regurgitation is not visualized. No evidence of pulmonic stenosis. Aorta: The aortic root is normal in size and structure. Venous: The inferior vena cava is normal in size  with greater than 50% respiratory variability, suggesting right atrial pressure of 3 mmHg. IAS/Shunts: No atrial level shunt detected by color flow Doppler.  LEFT VENTRICLE PLAX 2D LVIDd:         4.00 cm   Diastology LVIDs:         2.80 cm   LV e' lateral:   7.94 cm/s LV PW:         1.40 cm   LV E/e' lateral: 7.7 LV IVS:        1.30 cm LVOT diam:     2.20 cm LV SV:         70 LV SV Index:   31 LVOT Area:     3.80 cm  RIGHT VENTRICLE RV S prime:     17.40 cm/s TAPSE (M-mode): 2.7 cm LEFT ATRIUM             Index        RIGHT ATRIUM           Index LA diam:        3.60 cm 1.58 cm/m   RA Area:     23.50 cm LA Vol (A2C):   74.1 ml 32.51 ml/m  RA Volume:   65.70 ml  28.83 ml/m LA Vol (A4C):  78.9 ml 34.62 ml/m LA Biplane Vol: 78.6 ml 34.49 ml/m  AORTIC VALVE AV Area (Vmax):    2.78 cm AV Area (Vmean):   2.62 cm AV Area (VTI):     2.77 cm AV Vmax:           152.00 cm/s AV Vmean:          100.000 cm/s AV VTI:            0.251 m AV Peak Grad:      9.2 mmHg AV Mean Grad:      5.0 mmHg LVOT Vmax:         111.00 cm/s LVOT Vmean:        69.000 cm/s LVOT VTI:          0.183 m LVOT/AV VTI ratio: 0.73  AORTA Ao Root diam: 3.60 cm Ao Asc diam:  3.60 cm MITRAL VALVE MV Area (PHT): 3.74 cm    SHUNTS MV Decel Time: 203 msec    Systemic VTI:  0.18 m MV E velocity: 61.30 cm/s  Systemic Diam: 2.20 cm MV A velocity: 89.10 cm/s MV E/A ratio:  0.69 Donato Schultz MD Electronically signed by Donato Schultz MD Signature Date/Time: 02/11/2023/11:39:11 AM    Final    DG Chest Portable 1 View  Result Date: 02/11/2023 CLINICAL DATA:  Central chest pain beginning 0130 hours EXAM: PORTABLE CHEST 1 VIEW COMPARISON:  04/04/2022 FINDINGS: Artifact overlies the chest. Heart size upper limits of normal. Chronic aortic atherosclerotic calcification. Upper lungs are clear. No pulmonary edema. Lung bases are poorly seen due portable nature of the film and patient positioning. Consider two-view chest radiography if concern persists regarding the  potential for basilar lung disease. No regional bone abnormality. IMPRESSION: No active disease suspected. Lung bases poorly seen due to portable nature of the film and patient positioning. Consider two-view chest radiography if concern persists. Electronically Signed   By: Paulina Fusi M.D.   On: 02/11/2023 07:59    Assessment/Plan This is a pleasant 67 year old gentleman with a history of GERD, hypertension, anxiety/depression being admitted to the hospital with an NSTEMI.  No acute EKG changes, has been seen in consultation by cardiology. Given ASA load. -Inpatient admission to stepdown unit  -IV heparin drip  -Continue home amlodipine, Coreg, statin, ASA daily as recommended by cardiology -Keep n.p.o. for anticipated cardiac cath -Will continue to trend troponin until peak -Anticipate diagnostic cardiac cath in the next 24 hours  Essential hypertension-as above    GERD   Hypothyroidism    Depression with anxiety    AKI (acute kidney injury) (HCC)-no prior history of kidney disease, unclear etiology for his elevated creatinine.  Will hydrate him, avoid nephrotoxins in anticipation of catheterization.  Follow renal function with daily labs.  DVT prophylaxis: IV heparin drip    Code Status: Full Code  Consults called: None  Admission status: The appropriate patient status for this patient is INPATIENT. Inpatient status is judged to be reasonable and necessary in order to provide the required intensity of service to ensure the patient's safety. The patient's presenting symptoms, physical exam findings, and initial radiographic and laboratory data in the context of their chronic comorbidities is felt to place them at high risk for further clinical deterioration. Furthermore, it is not anticipated that the patient will be medically stable for discharge from the hospital within 2 midnights of admission.    I certify that at the point of admission it is my clinical judgment that the patient  will require inpatient hospital care spanning beyond 2 midnights from the point of admission due to high intensity of service, high risk for further deterioration and high frequency of surveillance required  Time spent: 49 minutes  Delesa Kawa Sharlette Dense MD [facility] Pager [phone]  If 7PM-7AM, please contact night-coverage [email or web address] Password [facility]  02/11/2023, 1:58 PM

## 2023-02-11 NOTE — Progress Notes (Signed)
ANTICOAGULATION CONSULT NOTE - Initial Consult  Pharmacy Consult for IV heparin Indication: chest pain/ACS  No Known Allergies  Patient Measurements: Height: 5\' 7"  (170.2 cm) Weight: 120.2 kg (265 lb) IBW/kg (Calculated) : 66.1 Heparin Dosing Weight: 93 kg  Vital Signs: Temp: 98.8 F (37.1 C) (05/22 0945) Temp Source: Oral (05/22 0705) BP: 158/99 (05/22 0945) Pulse Rate: 94 (05/22 0945)  Labs: Recent Labs    02/11/23 0710 02/11/23 0727 02/11/23 0910  HGB 16.4 17.0  --   HCT 48.6 50.0  --   PLT 276  --   --   LABPROT 13.3  --   --   INR 1.0  --   --   CREATININE 1.72* 1.70*  --   TROPONINIHS 33*  --  300*    Estimated Creatinine Clearance: 53 mL/min (A) (by C-G formula based on SCr of 1.7 mg/dL (H)).   Medical History: Past Medical History:  Diagnosis Date   Allergy    seasonal allergies   Anxiety    on meds   Chronic kidney disease    hx of kidney stones   Depression    on meds   ED (erectile dysfunction)    GERD (gastroesophageal reflux disease)    not on meds-diet controlled-uses OTC meds for tx   Heart murmur    at age 67 years old, heard x1 , never again   History of kidney stones    Hyperlipidemia    on meds   Hypertension    on meds   Hypothyroidism    hx of-was on meds-   PONV (postoperative nausea and vomiting)    Warts, genital     Medications:  (Not in a hospital admission)  Scheduled:   heparin  4,000 Units Intravenous Once   PRN:   Assessment: 67 yoM with PMh CKD, HLD, HTN, hypothyroid, presenting 5/22 with chest pain. No EKG changes & mild troponin increase. Pharmacy to dose IV heparin for ACS. Plan is for cardiac cath in next 24 hr.  Baseline INR WNL Prior anticoagulation: none Note pt prescribed testosterone injections at home (increased thromboembolic risk)  Significant events:  Today, 02/11/2023: CBC: WNL SCr acutely elevated above CKD baseline (~1.4 earlier this month) No bleeding or infusion issues per  nursing  Goal of Therapy: Heparin level 0.3-0.7 units/ml Monitor platelets by anticoagulation protocol: Yes  Plan: Heparin 4000 units IV bolus x 1 Heparin 1200 units/hr IV infusion (slightly less aggressive dosing d/t AKI) Check heparin level 6-8 hrs after start Daily CBC, daily heparin level once stable Monitor for signs of bleeding or thrombosis  Bernadene Person, PharmD, BCPS [phone] 02/11/2023, 10:55 AM

## 2023-02-11 NOTE — Consult Note (Addendum)
 Cardiology Consultation   Patient ID: Richard Sellers MRN: [id number]; DOB: 03/11/1956  Admit date: 02/11/2023 Date of Consult: 02/11/2023  PCP:  Fry, Stephen A, MD    HeartCare Providers Cardiologist:  None   {:1  Patient Profile:   Richard Sellers is a 67 y.o. male with a hx of CKD, hyperlipidemia, hypertension, hypothyroidism, GERD who is being seen 02/11/2023 for the evaluation of chest pain at the request of Dr. Dixon.  History of Present Illness:   Mr. Zeoli has no prior cardiac history.  Patient was recently seen on Jan 23, 2023 by his primary care provider with complaints of right upper quadrant pain, nausea.  He had a right upper quadrant ultrasound that was negative for cholecystitis or cholelithiasis. Notes state that his nausea is a recurrent problem. Has history of a previously normal echocardiogram with LVEF of 60 to 65% in 2021.  Since May 3 patient has had ongoing complaints of right upper quadrant and epigastric pain associated with nausea.  He states that last night at approximately 1 AM unclear if it woke him up for sleep or not. He describes the pain as a consistent heaviness and mild discomfort.  He states that he has accompanying nausea but denies any radiation or shortness of breath with the pain.  This pain is nonexertional, has never happened before, reproducible, persistent and has been lasting since this morning at 1 AM.  Aside from this 1 episode patient has not had prior history of chest pain.  Patient is not diabetic, does not smoke, does not have significant family history of cardiac disease.  He did mention a mother who may have died of a heart attack in her 90s.  Patient denies any peripheral edema, palpitations, shortness of breath, palpitations, orthopnea. 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  Also has been on Testerone for 7-8 years.  Chest x-ray negative.  EKG showing normal sinus rhythm, heart rate 83.   Some minimal ST depression in lateral leads). No acute ischemic changes. Diffuse nonspecific T wave changes. Troponins 33-300. No other significant labs or imaging. He's been given aspirin load, nitro, fentanyl and been started on heparin.  Past Medical History:  Diagnosis Date   Allergy    seasonal allergies   Anxiety    on meds   Chronic kidney disease    hx of kidney stones   Depression    on meds   ED (erectile dysfunction)    GERD (gastroesophageal reflux disease)    not on meds-diet controlled-uses OTC meds for tx   Heart murmur    at age 18 years old, heard x1 , never again   History of kidney stones    Hyperlipidemia    on meds   Hypertension    on meds   Hypothyroidism    hx of-was on meds-   PONV (postoperative nausea and vomiting)    Warts, genital     Past Surgical History:  Procedure Laterality Date   APPENDECTOMY  1975   COLONOSCOPY  11/08/2007   per Dr. Patterson, diverticulosis but no polyps, repeat in 10 yrs    CYSTECTOMY     EXTRACORPOREAL SHOCK WAVE LITHOTRIPSY Left 10/29/2017   Procedure: LEFT EXTRACORPOREAL SHOCK WAVE LITHOTRIPSY (ESWL);  Surgeon: Dahlstedt, Stephen, MD;  Location: [facility];  Service: Urology;  Laterality: Left;   HERNIA REPAIR  07/2017   umbilical hernia   KNEE ARTHROSCOPY Right 1993     TONSILLECTOMY     WISDOM TOOTH EXTRACTION  1990   Inpatient Medications: Scheduled Meds:  Continuous Infusions:  heparin 1,200 Units/hr (02/11/23 1105)   PRN Meds: perflutren lipid microspheres (DEFINITY) IV suspension  Allergies:   No Known Allergies  Social History:   Social History   Socioeconomic History   Marital status: Divorced    Spouse name: Not on file   Number of children: Not on file   Years of education: Not on file   Highest education level: Not on file  Occupational History   Not on file  Tobacco Use   Smoking status: Former    Types: Cigarettes    Quit date: 09/22/2002    Years since quitting: 20.4   Smokeless tobacco:  Never  Vaping Use   Vaping Use: Every day  Substance and Sexual Activity   Alcohol use: Yes    Alcohol/week: 28.0 standard drinks of alcohol    Types: 7 Standard drinks or equivalent, 21 Cans of beer per week    Comment: 2-3 beers per day   Drug use: No   Sexual activity: Not on file  Other Topics Concern   Not on file  Social History Narrative   Not on file   Social Determinants of Health   Financial Resource Strain: Not on file  Food Insecurity: Not on file  Transportation Needs: Not on file  Physical Activity: Not on file  Stress: Not on file  Social Connections: Not on file  Intimate Partner Violence: Not on file    Family History:   Family History  Problem Relation Age of Onset   Hypertension Other        family hx   Hyperlipidemia Other        family hx   Colon polyps Neg Hx    Colon cancer Neg Hx    Esophageal cancer Neg Hx    Stomach cancer Neg Hx    Rectal cancer Neg Hx      ROS:  Please see the history of present illness.  All other ROS reviewed and negative.     Physical Exam/Data:   Vitals:   02/11/23 0800 02/11/23 0815 02/11/23 0945 02/11/23 1252  BP: (!) 153/109 (!) 126/97 (!) 158/99 120/77  Pulse: 96 100 94 87  Resp: (!) 24  (!) 21 19  Temp:   98.8 F (37.1 C) 98.6 F (37 C)  TempSrc:    Oral  SpO2: 93% 91% 91% 93%  Weight:      Height:       No intake or output data in the 24 hours ending 02/11/23 1308    02/11/2023    7:05 AM  Last 3 Weights  Weight (lbs) 265 lb  Weight (kg) 120.203 kg     Body mass index is 41.5 kg/m.  General:  Well nourished, well developed, in no acute distress HEENT: normal Neck: no JVD Vascular: No carotid bruits; Distal pulses 2+ bilaterally Cardiac:  normal S1, S2; RRR; no murmur. Tenderness to palpation. Lungs:  clear to auscultation bilaterally, no wheezing, rhonchi or rales  Abd: + RUQ and epigastric pain. Ext: no edema Musculoskeletal:  No deformities, BUE and BLE strength normal and equal Skin:  warm and dry  Neuro:  CNs 2-12 intact, no focal abnormalities noted Psych:  Normal affect   EKG:  The EKG was personally reviewed and demonstrates:  EKG showing normal sinus rhythm, heart rate 83.  No acute ischemic changes.Some minimal ST depression in lateral leads). Diffuse   nonspecific T wave changes Telemetry:  Telemetry was personally reviewed and demonstrates:  not hooked up on telemetry.  Relevant CV Studies:   Laboratory Data:  High Sensitivity Troponin:   Recent Labs  Lab 02/11/23 0710 02/11/23 0910 02/11/23 1110  TROPONINIHS 33* 300* 1,077*     Chemistry Recent Labs  Lab 02/11/23 0710 02/11/23 0727  NA 135 137  K 4.3 4.3  CL 101 100  CO2 26  --   GLUCOSE 123* 118*  BUN 12 10  CREATININE 1.72* 1.70*  CALCIUM 9.2  --   MG 1.7  --   GFRNONAA 43*  --   ANIONGAP 8  --     Recent Labs  Lab 02/11/23 0710  PROT 7.7  ALBUMIN 3.8  AST 27  ALT 24  ALKPHOS 65  BILITOT 1.1   Lipids  Recent Labs  Lab 02/11/23 0710  CHOL 186  TRIG 87  HDL 34*  LDLCALC 135*  CHOLHDL 5.5    Hematology Recent Labs  Lab 02/11/23 0710 02/11/23 0727  WBC 9.6  --   RBC 4.85  --   HGB 16.4 17.0  HCT 48.6 50.0  MCV 100.2*  --   MCH 33.8  --   MCHC 33.7  --   RDW 14.7  --   PLT 276  --    Thyroid No results for input(s): "TSH", "FREET4" in the last 168 hours.  BNPNo results for input(s): "BNP", "PROBNP" in the last 168 hours.  DDimer No results for input(s): "DDIMER" in the last 168 hours.   Radiology/Studies:  ECHOCARDIOGRAM COMPLETE  Result Date: 02/11/2023    ECHOCARDIOGRAM REPORT   Patient Name:   Anes L Mccaughey Date of Exam: 02/11/2023 Medical Rec #:  [id number]        Height:       67.0 in Accession #:    [id number]       Weight:       265.0 lb Date of Birth:  12/26/1955       BSA:          2.279 m Patient Age:    66 years         BP:           158/99 mmHg Patient Gender: M                HR:           91 bpm. Exam Location:  Inpatient Procedure: 2D Echo, Color  Doppler, Cardiac Doppler and Intracardiac            Opacification Agent STAT ECHO Indications:    Chest Pain  History:        Patient has prior history of Echocardiogram examinations, most                 recent 08/27/2020. Risk Factors:Hypertension.  Sonographer:    Melissa Kafa Referring Phys: [id number] SHENG L HALEY  Sonographer Comments: Technically difficult study due to poor echo windows, suboptimal apical window, suboptimal parasternal window and patient is obese. Image acquisition challenging due to patient body habitus. IMPRESSIONS  1. Left ventricular ejection fraction, by estimation, is 55 to 60%. The left ventricle has normal function. The left ventricle demonstrates regional wall motion abnormalities (see scoring diagram/findings for description). There is mild left ventricular  hypertrophy. Left ventricular diastolic parameters are consistent with Grade I diastolic dysfunction (impaired relaxation).  2. Right ventricular systolic function is normal. The right ventricular size is normal.  3.   Left atrial size was mildly dilated.  4. Right atrial size was mildly dilated.  5. The mitral valve is normal in structure. No evidence of mitral valve regurgitation. No evidence of mitral stenosis.  6. The aortic valve is normal in structure. Aortic valve regurgitation is not visualized. No aortic stenosis is present.  7. The inferior vena cava is normal in size with greater than 50% respiratory variability, suggesting right atrial pressure of 3 mmHg. FINDINGS  Left Ventricle: Left ventricular ejection fraction, by estimation, is 55 to 60%. The left ventricle has normal function. The left ventricle demonstrates regional wall motion abnormalities. Definity contrast agent was given IV to delineate the left ventricular endocardial borders. The left ventricular internal cavity size was normal in size. There is mild left ventricular hypertrophy. Left ventricular diastolic parameters are consistent with Grade I diastolic  dysfunction (impaired relaxation).  LV Wall Scoring: The apical anterior segment is akinetic. Right Ventricle: The right ventricular size is normal. No increase in right ventricular wall thickness. Right ventricular systolic function is normal. Left Atrium: Left atrial size was mildly dilated. Right Atrium: Right atrial size was mildly dilated. Pericardium: There is no evidence of pericardial effusion. Mitral Valve: The mitral valve is normal in structure. No evidence of mitral valve regurgitation. No evidence of mitral valve stenosis. Tricuspid Valve: The tricuspid valve is normal in structure. Tricuspid valve regurgitation is not demonstrated. No evidence of tricuspid stenosis. Aortic Valve: The aortic valve is normal in structure. Aortic valve regurgitation is not visualized. No aortic stenosis is present. Aortic valve mean gradient measures 5.0 mmHg. Aortic valve peak gradient measures 9.2 mmHg. Aortic valve area, by VTI measures 2.77 cm. Pulmonic Valve: The pulmonic valve was normal in structure. Pulmonic valve regurgitation is not visualized. No evidence of pulmonic stenosis. Aorta: The aortic root is normal in size and structure. Venous: The inferior vena cava is normal in size with greater than 50% respiratory variability, suggesting right atrial pressure of 3 mmHg. IAS/Shunts: No atrial level shunt detected by color flow Doppler.  LEFT VENTRICLE PLAX 2D LVIDd:         4.00 cm   Diastology LVIDs:         2.80 cm   LV e' lateral:   7.94 cm/s LV PW:         1.40 cm   LV E/e' lateral: 7.7 LV IVS:        1.30 cm LVOT diam:     2.20 cm LV SV:         70 LV SV Index:   31 LVOT Area:     3.80 cm  RIGHT VENTRICLE RV S prime:     17.40 cm/s TAPSE (M-mode): 2.7 cm LEFT ATRIUM             Index        RIGHT ATRIUM           Index LA diam:        3.60 cm 1.58 cm/m   RA Area:     23.50 cm LA Vol (A2C):   74.1 ml 32.51 ml/m  RA Volume:   65.70 ml  28.83 ml/m LA Vol (A4C):   78.9 ml 34.62 ml/m LA Biplane Vol: 78.6  ml 34.49 ml/m  AORTIC VALVE AV Area (Vmax):    2.78 cm AV Area (Vmean):   2.62 cm AV Area (VTI):     2.77 cm AV Vmax:           152.00 cm/s   AV Vmean:          100.000 cm/s AV VTI:            0.251 m AV Peak Grad:      9.2 mmHg AV Mean Grad:      5.0 mmHg LVOT Vmax:         111.00 cm/s LVOT Vmean:        69.000 cm/s LVOT VTI:          0.183 m LVOT/AV VTI ratio: 0.73  AORTA Ao Root diam: 3.60 cm Ao Asc diam:  3.60 cm MITRAL VALVE MV Area (PHT): 3.74 cm    SHUNTS MV Decel Time: 203 msec    Systemic VTI:  0.18 m MV E velocity: 61.30 cm/s  Systemic Diam: 2.20 cm MV A velocity: 89.10 cm/s MV E/A ratio:  0.69 Mark Skains MD Electronically signed by Mark Skains MD Signature Date/Time: 02/11/2023/11:39:11 AM    Final    DG Chest Portable 1 View  Result Date: 02/11/2023 CLINICAL DATA:  Central chest pain beginning 0130 hours EXAM: PORTABLE CHEST 1 VIEW COMPARISON:  04/04/2022 FINDINGS: Artifact overlies the chest. Heart size upper limits of normal. Chronic aortic atherosclerotic calcification. Upper lungs are clear. No pulmonary edema. Lung bases are poorly seen due portable nature of the film and patient positioning. Consider two-view chest radiography if concern persists regarding the potential for basilar lung disease. No regional bone abnormality. IMPRESSION: No active disease suspected. Lung bases poorly seen due to portable nature of the film and patient positioning. Consider two-view chest radiography if concern persists. Electronically Signed   By: Mark  Shogry M.D.   On: 02/11/2023 07:59     Assessment and Plan:   Atypical chest pain with elevated troponins Patient presenting with 1st time occurrence of nonexertional, non radiating, reproducible chest pain with other complaints of nausea that has been going on for the past few weeks.  EKG not showing any ischemic changes (some minimal ST depression in lateral leads).  Troponin have increased from 33-300. He has been started on heparin. Will plan for  cardiac catherization.   Shared Decision Making/Informed Consent{ The risks [stroke (1 in 1000), death (1 in 1000), kidney failure [usually temporary] (1 in 500), bleeding (1 in 200), allergic reaction [possibly serious] (1 in 200)], benefits (diagnostic support and management of coronary artery disease) and alternatives of a cardiac catheterization were discussed in detail with Mr. Juenger and he is willing to proceed.   Ordering STAT echo.  Will trend trops  Has been given aspirin load Will repeat EKG  Will order A1c   HTN  PTA carvedilol 12.5mg BID (states he takes once a day), losartan-hydrochlorothiazide (HYZAAR) 100-25 MG tablet (hold now prior to cath, can resume after), amlodipine 5mg. Has not had any of his medications today.  CKD Cr 1.7, GFR 43  HLD Not on statin. Will order lipid panel.   Risk Assessment/Risk Scores:    For questions or updates, please contact [facility] Please consult [email or web address] for contact info under    Signed, Dearia Wilmouth, DO  02/11/2023 1:08 PM   Patient seen and examined, note reviewed with the signed Advanced Practice Provider. I personally reviewed laboratory data, imaging studies and relevant notes. I independently examined the patient and formulated the important aspects of the plan. I have personally discussed the plan with the patient and/or family. Comments or changes to the note/plan are indicated below.  Patient seen examined his bedside in the emergency department was in   the hospital.  He tells me that he woke up in the middle of the night given the fact that he was experiencing significant chest discomfort more pressure-like sensation.  No radiation but associated nausea and vomiting.  He waited for few minutes hoping that this will improve and it did not.  He was therefore concerned and decided to come to the emergency department to be evaluated.  NSTEMI,Chest discomfort concerning for angina in the setting of elevated  troponin Hypertension,  hyperlipidemia,  CKD Morbid obesity   After speaking with the patient he may have multiple pain which 1 is atypical and reproducible however his pain did recently occur with the chest heaviness overnight waking him up from sleep that is still persistent despite the heparin drip makes me highly suspicious for anginal equivalent.  He is intermediate risk for coronary atherosclerosis therefore I would like to proceed with a ischemic evaluation in this patient.  Ideally heart catheterization would be appropriate.  Start aspirin 81 mg daily, start Lipitor 20 mg daily. Continue his carvedilol 12.5 mg twice daily, continue his amlodipine 5 mg daily. Hold for now on the losartan and his hydrochlorothiazide we can restart that post heart catheterization tomorrow. Please get an echocardiogram to assess his LV function. He will benefit from getting hemoglobin A1c as well as lipid profile while he is in the hospital. He is very anxious he takes daily Xanax will defer to the primary team for restarting this.  The patient understands that risks include but are not limited to stroke (1 in 1000), death (1 in 1000), kidney failure [usually temporary] (1 in 500), bleeding (1 in 200), allergic reaction [possibly serious] (1 in 200), and agrees to proceed.   Orien Mayhall DO, MS FACC Attending Cardiologist [place]  [facility]  [address] [place], [place] [address] [phone] Website: [email or web address]  

## 2023-02-11 NOTE — ED Triage Notes (Signed)
Pt arrives POV c/o midsternal chest "heaviness" that started around 0130 today. Also states that he is having nausea and sweating. Took a diclofenac without relief. Denies cardiac hx. Denies SOB.

## 2023-02-11 NOTE — ED Provider Notes (Signed)
[place] EMERGENCY DEPARTMENT AT [facility] Provider Note   CSN: [id number] Arrival date & time: 02/11/23  2952     History  Chief Complaint  Patient presents with   Chest Pain    Richard Sellers is a 67 y.o. male.   Chest Pain Associated symptoms: diaphoresis and nausea   Patient presents for chest pain.  Medical history includes HTN, GERD, depression.  He has never seen a cardiologist.  He does not smoke but he does vape.  Last night, he was in his normal state of health.  At 1:30 AM, he woke from sleep.  He had a central chest pressure.  He also experienced nausea and diaphoresis.  He was able to doze back off to sleep but the pain never went away.  He currently endorses ongoing chest pain/pressure and some mild nausea.  He denies any other current symptoms.     Home Medications Prior to Admission medications   Medication Sig Start Date End Date Taking? Authorizing Provider  ALPRAZolam Prudy Feeler) 1 MG tablet Take 1 tablet (1 mg total) by mouth every 6 (six) hours as needed for anxiety. for anxiety 11/28/22  Yes Nelwyn Salisbury, MD  amLODipine (NORVASC) 5 MG tablet TAKE 1 TABLET BY MOUTH DAILY 01/09/23  Yes Nelwyn Salisbury, MD  carvedilol (COREG) 12.5 MG tablet TAKE 1 TABLET BY MOUTH TWICE A DAY WITH A MEAL Patient taking differently: Take 12.5 mg by mouth daily. 01/09/23  Yes Nelwyn Salisbury, MD  diclofenac (VOLTAREN) 75 MG EC tablet TAKE ONE TABLET BY MOUTH TWICE A DAY 12/31/22  Yes Nelwyn Salisbury, MD  losartan-hydrochlorothiazide [facility]) 100-25 MG tablet TAKE 1 TABLET BY MOUTH DAILY 01/09/23  Yes Nelwyn Salisbury, MD  omeprazole (PRILOSEC) 40 MG capsule TAKE 1 CAPSULE BY MOUTH DAILY 08/11/22  Yes Nelwyn Salisbury, MD  ondansetron (ZOFRAN) 4 MG tablet Take 1 tablet (4 mg total) by mouth every 8 (eight) hours as needed for nausea or vomiting. 01/23/23  Yes Karie Georges, MD  testosterone cypionate (DEPOTESTOSTERONE CYPIONATE) 200 MG/ML injection INJECT 1 ML INTRAMUSCULARLY  EVERY 7 DAYS Patient taking differently: Inject 200 mg into the muscle every 7 (seven) days. 10/15/22  Yes Nelwyn Salisbury, MD  triamcinolone cream (KENALOG) 0.1 % Apply 1 Application topically 2 (two) times daily as needed. 09/03/22  Yes Nelwyn Salisbury, MD  albuterol (VENTOLIN HFA) 108 (90 Base) MCG/ACT inhaler Inhale 2 puffs into the lungs every 4 (four) hours as needed for wheezing or shortness of breath. Patient not taking: Reported on 02/11/2023 10/24/22   Nelwyn Salisbury, MD  Cholecalciferol (VITAMIN D) 125 MCG (5000 UT) CAPS Take 1 capsule by mouth once a week. Patient not taking: Reported on 02/11/2023 02/27/20   Nelwyn Salisbury, MD  dextromethorphan-guaiFENesin [facility]) 30-600 MG 12hr tablet Take 1 tablet by mouth 2 (two) times daily. Patient not taking: Reported on 02/11/2023 09/29/22   Karie Georges, MD  fluticasone [facility]) 50 MCG/ACT nasal spray Place 1 spray into both nostrils 2 (two) times daily. Patient not taking: Reported on 02/11/2023 03/26/22   [place], Betty G, MD  gabapentin (NEURONTIN) 300 MG capsule Take 1 capsule (300 mg total) by mouth 3 (three) times daily. Patient not taking: Reported on 02/11/2023 08/30/21   Nelwyn Salisbury, MD  halobetasol (ULTRAVATE) 0.05 % cream Apply topically 2 (two) times daily. APPLY TOPICALLY TWO TIMES A DAY Patient not taking: Reported on 02/11/2023 03/22/20   Nelwyn Salisbury, MD  montelukast (SINGULAIR) 10 MG tablet TAKE 1 TABLET BY MOUTH AT BEDTIME 04/22/22   Nelwyn Salisbury, MD  SYRINGE-NEEDLE, DISP, 3 ML (SAFETY SYRINGES/NEEDLE) 20G X 1-1/2" 3 ML MISC 1 application by Does not apply route every 14 (fourteen) days. Patient not taking: Reported on 02/11/2023 11/25/16   Nelwyn Salisbury, MD  tadalafil (CIALIS) 20 MG tablet Take 1 tablet (20 mg total) by mouth daily as needed for erectile dysfunction. Patient not taking: Reported on 02/11/2023 03/22/20   Nelwyn Salisbury, MD  temazepam (RESTORIL) 15 MG capsule TAKE TWO CAPSULES BY MOUTH EVERY NIGHT AT BEDTIME Patient  not taking: Reported on 02/11/2023 07/20/20   Nelwyn Salisbury, MD      Allergies    Patient has no known allergies.    Review of Systems   Review of Systems  Constitutional:  Positive for diaphoresis.  Cardiovascular:  Positive for chest pain.  Gastrointestinal:  Positive for nausea.  All other systems reviewed and are negative.   Physical Exam Updated Vital Signs BP 120/77 (BP Location: Left Arm)   Pulse 87   Temp 98.6 F (37 C) (Oral)   Resp 19   Ht 5\' 7"  (1.702 m)   Wt 120.2 kg   SpO2 93%   BMI 41.50 kg/m  Physical Exam Vitals and nursing note reviewed.  Constitutional:      General: He is not in acute distress.    Appearance: He is well-developed. He is diaphoretic. He is not ill-appearing or toxic-appearing.  HENT:     Head: Normocephalic and atraumatic.  Eyes:     Conjunctiva/sclera: Conjunctivae normal.  Cardiovascular:     Rate and Rhythm: Normal rate and regular rhythm.     Heart sounds: No murmur heard. Pulmonary:     Effort: Pulmonary effort is normal. No respiratory distress.     Breath sounds: Normal breath sounds. No decreased breath sounds, wheezing, rhonchi or rales.  Chest:     Chest wall: No tenderness.  Abdominal:     Palpations: Abdomen is soft.     Tenderness: There is abdominal tenderness (Mild, epigastric).  Musculoskeletal:        General: No swelling. Normal range of motion.     Cervical back: Normal range of motion and neck supple.     Right lower leg: No edema.     Left lower leg: No edema.  Skin:    General: Skin is warm.     Capillary Refill: Capillary refill takes less than 2 seconds.     Coloration: Skin is not cyanotic or pale.  Neurological:     General: No focal deficit present.     Mental Status: He is alert and oriented to person, place, and time.  Psychiatric:        Mood and Affect: Mood normal.        Behavior: Behavior normal.     ED Results / Procedures / Treatments   Labs (all labs ordered are listed, but only  abnormal results are displayed) Labs Reviewed  BASIC METABOLIC PANEL - Abnormal; Notable for the following components:      Result Value   Glucose, Bld 123 (*)    Creatinine, Ser 1.72 (*)    GFR, Estimated 43 (*)    All other components within normal limits  CBC WITH DIFFERENTIAL/PLATELET - Abnormal; Notable for the following components:   MCV 100.2 (*)    Abs Immature Granulocytes 0.11 (*)    All other components within normal limits  LIPID PANEL - Abnormal; Notable for the following components:   HDL 34 (*)    LDL Cholesterol 135 (*)    All other components within normal limits  CBG MONITORING, ED - Abnormal; Notable for the following components:   Glucose-Capillary 117 (*)    All other components within normal limits  I-STAT CHEM 8, ED - Abnormal; Notable for the following components:   Creatinine, Ser 1.70 (*)    Glucose, Bld 118 (*)    All other components within normal limits  TROPONIN I (HIGH SENSITIVITY) - Abnormal; Notable for the following components:   Troponin I (High Sensitivity) 33 (*)    All other components within normal limits  TROPONIN I (HIGH SENSITIVITY) - Abnormal; Notable for the following components:   Troponin I (High Sensitivity) 300 (*)    All other components within normal limits  TROPONIN I (HIGH SENSITIVITY) - Abnormal; Notable for the following components:   Troponin I (High Sensitivity) 1,077 (*)    All other components within normal limits  MAGNESIUM  HEPATIC FUNCTION PANEL  LIPASE, BLOOD  PROTIME-INR  HEPARIN LEVEL (UNFRACTIONATED)  HEMOGLOBIN A1C  TROPONIN I (HIGH SENSITIVITY)    EKG EKG Interpretation  Date/Time:  Wednesday Feb 11 2023 07:06:44 EDT Ventricular Rate:  83 PR Interval:  180 QRS Duration: 92 QT Interval:  337 QTC Calculation: 396 R Axis:   133 Text Interpretation: Sinus rhythm Atrial premature complex Low voltage, precordial leads Consider right ventricular hypertrophy Borderline T abnormalities, diffuse leads  Confirmed by Gloris Manchester [phone]) on 02/11/2023 7:57:10 AM  Radiology ECHOCARDIOGRAM COMPLETE  Result Date: 02/11/2023    ECHOCARDIOGRAM REPORT   Patient Name:   Richard Sellers Date of Exam: 02/11/2023 Medical Rec #:  [id number]        Height:       67.0 in Accession #:    [id number]       Weight:       265.0 lb Date of Birth:  06-04-1956       BSA:          2.279 m Patient Age:    66 years         BP:           158/99 mmHg Patient Gender: M                HR:           91 bpm. Exam Location:  Inpatient Procedure: 2D Echo, Color Doppler, Cardiac Doppler and Intracardiac            Opacification Agent STAT ECHO Indications:    Chest Pain  History:        Patient has prior history of Echocardiogram examinations, most                 recent 08/27/2020. Risk Factors:Hypertension.  Sonographer:    Milbert Coulter Referring Phys: [id number] SHENG L HALEY  Sonographer Comments: Technically difficult study due to poor echo windows, suboptimal apical window, suboptimal parasternal window and patient is obese. Image acquisition challenging due to patient body habitus. IMPRESSIONS  1. Left ventricular ejection fraction, by estimation, is 55 to 60%. The left ventricle has normal function. The left ventricle demonstrates regional wall motion abnormalities (see scoring diagram/findings for description). There is mild left ventricular  hypertrophy. Left ventricular diastolic parameters are consistent with Grade I diastolic dysfunction (impaired relaxation).  2. Right ventricular systolic function is normal. The right ventricular size is normal.  3. Left atrial  size was mildly dilated.  4. Right atrial size was mildly dilated.  5. The mitral valve is normal in structure. No evidence of mitral valve regurgitation. No evidence of mitral stenosis.  6. The aortic valve is normal in structure. Aortic valve regurgitation is not visualized. No aortic stenosis is present.  7. The inferior vena cava is normal in size with greater than 50%  respiratory variability, suggesting right atrial pressure of 3 mmHg. FINDINGS  Left Ventricle: Left ventricular ejection fraction, by estimation, is 55 to 60%. The left ventricle has normal function. The left ventricle demonstrates regional wall motion abnormalities. Definity contrast agent was given IV to delineate the left ventricular endocardial borders. The left ventricular internal cavity size was normal in size. There is mild left ventricular hypertrophy. Left ventricular diastolic parameters are consistent with Grade I diastolic dysfunction (impaired relaxation).  LV Wall Scoring: The apical anterior segment is akinetic. Right Ventricle: The right ventricular size is normal. No increase in right ventricular wall thickness. Right ventricular systolic function is normal. Left Atrium: Left atrial size was mildly dilated. Right Atrium: Right atrial size was mildly dilated. Pericardium: There is no evidence of pericardial effusion. Mitral Valve: The mitral valve is normal in structure. No evidence of mitral valve regurgitation. No evidence of mitral valve stenosis. Tricuspid Valve: The tricuspid valve is normal in structure. Tricuspid valve regurgitation is not demonstrated. No evidence of tricuspid stenosis. Aortic Valve: The aortic valve is normal in structure. Aortic valve regurgitation is not visualized. No aortic stenosis is present. Aortic valve mean gradient measures 5.0 mmHg. Aortic valve peak gradient measures 9.2 mmHg. Aortic valve area, by VTI measures 2.77 cm. Pulmonic Valve: The pulmonic valve was normal in structure. Pulmonic valve regurgitation is not visualized. No evidence of pulmonic stenosis. Aorta: The aortic root is normal in size and structure. Venous: The inferior vena cava is normal in size with greater than 50% respiratory variability, suggesting right atrial pressure of 3 mmHg. IAS/Shunts: No atrial level shunt detected by color flow Doppler.  LEFT VENTRICLE PLAX 2D LVIDd:         4.00  cm   Diastology LVIDs:         2.80 cm   LV e' lateral:   7.94 cm/s LV PW:         1.40 cm   LV E/e' lateral: 7.7 LV IVS:        1.30 cm LVOT diam:     2.20 cm LV SV:         70 LV SV Index:   31 LVOT Area:     3.80 cm  RIGHT VENTRICLE RV S prime:     17.40 cm/s TAPSE (M-mode): 2.7 cm LEFT ATRIUM             Index        RIGHT ATRIUM           Index LA diam:        3.60 cm 1.58 cm/m   RA Area:     23.50 cm LA Vol (A2C):   74.1 ml 32.51 ml/m  RA Volume:   65.70 ml  28.83 ml/m LA Vol (A4C):   78.9 ml 34.62 ml/m LA Biplane Vol: 78.6 ml 34.49 ml/m  AORTIC VALVE AV Area (Vmax):    2.78 cm AV Area (Vmean):   2.62 cm AV Area (VTI):     2.77 cm AV Vmax:           152.00 cm/s AV Vmean:  100.000 cm/s AV VTI:            0.251 m AV Peak Grad:      9.2 mmHg AV Mean Grad:      5.0 mmHg LVOT Vmax:         111.00 cm/s LVOT Vmean:        69.000 cm/s LVOT VTI:          0.183 m LVOT/AV VTI ratio: 0.73  AORTA Ao Root diam: 3.60 cm Ao Asc diam:  3.60 cm MITRAL VALVE MV Area (PHT): 3.74 cm    SHUNTS MV Decel Time: 203 msec    Systemic VTI:  0.18 m MV E velocity: 61.30 cm/s  Systemic Diam: 2.20 cm MV A velocity: 89.10 cm/s MV E/A ratio:  0.69 Donato Schultz MD Electronically signed by Donato Schultz MD Signature Date/Time: 02/11/2023/11:39:11 AM    Final    DG Chest Portable 1 View  Result Date: 02/11/2023 CLINICAL DATA:  Central chest pain beginning 0130 hours EXAM: PORTABLE CHEST 1 VIEW COMPARISON:  04/04/2022 FINDINGS: Artifact overlies the chest. Heart size upper limits of normal. Chronic aortic atherosclerotic calcification. Upper lungs are clear. No pulmonary edema. Lung bases are poorly seen due portable nature of the film and patient positioning. Consider two-view chest radiography if concern persists regarding the potential for basilar lung disease. No regional bone abnormality. IMPRESSION: No active disease suspected. Lung bases poorly seen due to portable nature of the film and patient positioning. Consider  two-view chest radiography if concern persists. Electronically Signed   By: Paulina Fusi M.D.   On: 02/11/2023 07:59    Procedures Procedures    Medications Ordered in ED Medications  heparin ADULT infusion 100 units/mL (25000 units/278mL) (1,200 Units/hr Intravenous New Bag/Given 02/11/23 1105)  perflutren lipid microspheres (DEFINITY) IV suspension (3 mLs Intravenous Given 02/11/23 1151)  aspirin chewable tablet 81 mg (has no administration in time range)  atorvastatin (LIPITOR) tablet 20 mg (has no administration in time range)  carvedilol (COREG) tablet 12.5 mg (has no administration in time range)  amLODipine (NORVASC) tablet 5 mg (has no administration in time range)  aspirin chewable tablet 324 mg (324 mg Oral Given 02/11/23 0727)  nitroGLYCERIN (NITROGLYN) 2 % ointment 1 inch (1 inch Topical Given 02/11/23 0727)  fentaNYL (SUBLIMAZE) injection 50 mcg (50 mcg Intravenous Given 02/11/23 0727)  ondansetron (ZOFRAN) injection 4 mg (4 mg Intravenous Given 02/11/23 0727)  metoCLOPramide (REGLAN) injection 10 mg (10 mg Intravenous Given 02/11/23 0942)  alum & mag hydroxide-simeth (MAALOX/MYLANTA) 200-200-20 MG/5ML suspension 30 mL (30 mLs Oral Given 02/11/23 0951)    And  lidocaine (XYLOCAINE) 2 % viscous mouth solution 15 mL (15 mLs Oral Given 02/11/23 0951)  famotidine (PEPCID) IVPB 20 mg premix (0 mg Intravenous Stopped 02/11/23 1020)  heparin bolus via infusion 4,000 Units (4,000 Units Intravenous Bolus from Bag 02/11/23 1105)  ALPRAZolam (XANAX) tablet 1 mg (1 mg Oral Given 02/11/23 1149)    ED Course/ Medical Decision Making/ A&P                             Medical Decision Making Amount and/or Complexity of Data Reviewed Labs: ordered. Radiology: ordered.  Risk OTC drugs. Prescription drug management.   This patient presents to the ED for concern of chest pain, this involves an extensive number of treatment options, and is a complaint that carries with it a high risk of  complications and morbidity.  The differential  diagnosis includes ACS, GERD, PE, panic attack, gastritis, cholecystitis, pancreatitis   Co morbidities that complicate the patient evaluation  HTN, GERD, depression   Additional history obtained:  Additional history obtained from N/A External records from outside source obtained and reviewed including EMR   Lab Tests:  I Ordered, and personally interpreted labs.  The pertinent results include: Normal hemoglobin, no leukocytosis, normal electrolytes, baseline CKD, progressive elevation of troponin: 33-->300-->1077 suggestive of NSTEMI   Imaging Studies ordered:  I ordered imaging studies including chest x-ray  I independently visualized and interpreted imaging which showed  no acute findings I agree with the radiologist interpretation   Cardiac Monitoring: / EKG:  The patient was maintained on a cardiac monitor.  I personally viewed and interpreted the cardiac monitored which showed an underlying rhythm of:  sinus rhythm   Consultations Obtained:  I requested consultation with the cardiologist, Dr. Servando Salina,  and discussed lab and imaging findings as well as pertinent plan - they recommend: Admission to [facility].  Keep n.p.o. for planned cardiac cath   Problem List / ED Course / Critical interventions / Medication management  Patient presenting for chest pain/pressure since 1:30 AM.  He also had nausea and diaphoresis at home.  He continues to experience some mild nausea.  Vital signs on arrival are notable for hypertension.  EKG does not show ST segment elevations.  Patient was given 324 of ASA for concern of ACS.  NTG was ordered for chest pain and hypertension.  Fentanyl was ordered for analgesia.  Zofran was ordered for nausea.  Laboratory workup was initiated.  Initial troponin was elevated at 33.  Cardiology was consulted.  Remaining lab work was unremarkable.  Cardiology evaluated the patient in the ED.  Initially they felt  that symptoms were likely secondary to GI etiology.  Second troponin was further elevated at 300.  Patient was started on heparin.  Cardiology was reengaged.  Cardiology will plan on cardiac cath today or tomorrow.  They advised to keep patient n.p.o. for now.  Patient had improved pain while in the ED.  He remained hemodynamically stable.  He was admitted to medicine for further management. I ordered medication including NTG for hypertension and chest pain, ASA and heparin for NSTEMI; Zofran and Reglan for nausea; GI cocktail for epigastric discomfort; Xanax for anxiolysis Reevaluation of the patient after these medicines showed that the patient improved I have reviewed the patients home medicines and have made adjustments as needed   Social Determinants of Health:  Has PCP  CRITICAL CARE Performed by: Gloris Manchester   Total critical care time: 35 minutes  Critical care time was exclusive of separately billable procedures and treating other patients.  Critical care was necessary to treat or prevent imminent or life-threatening deterioration.  Critical care was time spent personally by me on the following activities: development of treatment plan with patient and/or surrogate as well as nursing, discussions with consultants, evaluation of patient's response to treatment, examination of patient, obtaining history from patient or surrogate, ordering and performing treatments and interventions, ordering and review of laboratory studies, ordering and review of radiographic studies, pulse oximetry and re-evaluation of patient's condition.         Final Clinical Impression(s) / ED Diagnoses Final diagnoses:  NSTEMI (non-ST elevated myocardial infarction) [facility])    Rx / DC Orders ED Discharge Orders     None         Gloris Manchester, MD 02/11/23 1332

## 2023-02-11 NOTE — ED Notes (Signed)
ED TO INPATIENT HANDOFF REPORT  ED Nurse Name and Phone #: Dorene Sorrow Name/Age/Gender Richard Sellers 67 y.o. male Room/Bed: WA10/WA10  Code Status   Code Status: Full Code  Home/SNF/Other Home Patient oriented to: self, place, time, and situation Is this baseline? Yes   Triage Complete: Triage complete  Chief Complaint NSTEMI (non-ST elevated myocardial infarction) [facility]) [I21.4]  Triage Note Pt arrives POV c/o midsternal chest "heaviness" that started around 0130 today. Also states that he is having nausea and sweating. Took a diclofenac without relief. Denies cardiac hx. Denies SOB.   Allergies No Known Allergies  Level of Care/Admitting Diagnosis ED Disposition     ED Disposition  Admit   Condition  --   Comment  Hospital Area: [facility] [[id number]]  Level of Care: Stepdown [14]  Admit to SDU based on following criteria: Cardiac Instability:  Patients experiencing chest pain, unconfirmed MI and stable, arrhythmias and CHF requiring medical management and potentially compromising patient's stability  May admit patient to [facility] or [facility] if equivalent level of care is available:: Yes  Covid Evaluation: Asymptomatic - no recent exposure (last 10 days) testing not required  Diagnosis: NSTEMI (non-ST elevated myocardial infarction) [facility]) [[id number]]  Admitting Physician: Maryln Gottron [[id number]]  Attending Physician: Olexa.Dam, MIR Jaxson.Roy [[id number]]  Certification:: I certify this patient will need inpatient services for at least 2 midnights  Estimated Length of Stay: 3          B Medical/Surgery History Past Medical History:  Diagnosis Date   Allergy    seasonal allergies   Anxiety    on meds   Chronic kidney disease    hx of kidney stones   Depression    on meds   ED (erectile dysfunction)    GERD (gastroesophageal reflux disease)    not on meds-diet controlled-uses OTC meds for tx   Heart murmur    at age 82 years old, heard  x1 , never again   History of kidney stones    Hyperlipidemia    on meds   Hypertension    on meds   Hypothyroidism    hx of-was on meds-   PONV (postoperative nausea and vomiting)    Warts, genital    Past Surgical History:  Procedure Laterality Date   APPENDECTOMY  1975   COLONOSCOPY  11/08/2007   per Dr. Jarold Motto, diverticulosis but no polyps, repeat in 10 yrs    CYSTECTOMY     EXTRACORPOREAL SHOCK WAVE LITHOTRIPSY Left 10/29/2017   Procedure: LEFT EXTRACORPOREAL SHOCK WAVE LITHOTRIPSY (ESWL);  Surgeon: Marcine Matar, MD;  Location: [facility];  Service: Urology;  Laterality: Left;   HERNIA REPAIR  07/2017   umbilical hernia   KNEE ARTHROSCOPY Right 1993   TONSILLECTOMY     WISDOM TOOTH EXTRACTION  1990     A IV Location/Drains/Wounds Patient Lines/Drains/Airways Status     Active Line/Drains/Airways     Name Placement date Placement time Site Days   Peripheral IV 02/11/23 20 G Left Antecubital 02/11/23  0717  Antecubital  less than 1            Intake/Output Last 24 hours No intake or output data in the 24 hours ending 02/11/23 1413  Labs/Imaging Results for orders placed or performed during the hospital encounter of 02/11/23 (from the past 48 hour(s))  Basic metabolic panel     Status: Abnormal   Collection Time: 02/11/23  7:10 AM  Result Value Ref Range  Sodium 135 135 - 145 mmol/L   Potassium 4.3 3.5 - 5.1 mmol/L   Chloride 101 98 - 111 mmol/L   CO2 26 22 - 32 mmol/L   Glucose, Bld 123 (H) 70 - 99 mg/dL    Comment: Glucose reference range applies only to samples taken after fasting for at least 8 hours.   BUN 12 8 - 23 mg/dL   Creatinine, Ser 1.61 (H) 0.61 - 1.24 mg/dL   Calcium 9.2 8.9 - 09.6 mg/dL   GFR, Estimated 43 (L) >60 mL/min    Comment: (NOTE) Calculated using the CKD-EPI Creatinine Equation (2021)    Anion gap 8 5 - 15    Comment: Performed at [facility], [address] [place], [place] [address]  Troponin I (High  Sensitivity)     Status: Abnormal   Collection Time: 02/11/23  7:10 AM  Result Value Ref Range   Troponin I (High Sensitivity) 33 (H) <18 ng/L    Comment: (NOTE) Elevated high sensitivity troponin I (hsTnI) values and significant  changes across serial measurements may suggest ACS but many other  chronic and acute conditions are known to elevate hsTnI results.  Refer to the "Links" section for chest pain algorithms and additional  guidance. Performed at [facility], [address], [place], [place] [address]   Magnesium     Status: None   Collection Time: 02/11/23  7:10 AM  Result Value Ref Range   Magnesium 1.7 1.7 - 2.4 mg/dL    Comment: Performed at [facility], [address] [place], [place] [address]  Hepatic function panel     Status: None   Collection Time: 02/11/23  7:10 AM  Result Value Ref Range   Total Protein 7.7 6.5 - 8.1 g/dL   Albumin 3.8 3.5 - 5.0 g/dL   AST 27 15 - 41 U/L   ALT 24 0 - 44 U/L   Alkaline Phosphatase 65 38 - 126 U/L   Total Bilirubin 1.1 0.3 - 1.2 mg/dL   Bilirubin, Direct 0.2 0.0 - 0.2 mg/dL   Indirect Bilirubin 0.9 0.3 - 0.9 mg/dL    Comment: Performed at [facility], [address] [place], [place] [address]  Lipase, blood     Status: None   Collection Time: 02/11/23  7:10 AM  Result Value Ref Range   Lipase 39 11 - 51 U/L    Comment: Performed at [facility], [address], [place], [place] [address]  CBC with Differential/Platelet     Status: Abnormal   Collection Time: 02/11/23  7:10 AM  Result Value Ref Range   WBC 9.6 4.0 - 10.5 K/uL   RBC 4.85 4.22 - 5.81 MIL/uL   Hemoglobin 16.4 13.0 - 17.0 g/dL   HCT 78.4 69.6 - 29.5 %   MCV 100.2 (H) 80.0 - 100.0 fL   MCH 33.8 26.0 - 34.0 pg   MCHC 33.7 30.0 - 36.0 g/dL   RDW 28.4 13.2 - 44.0 %   Platelets 276 150 - 400 K/uL   nRBC 0.0 0.0 - 0.2 %   Neutrophils Relative % 71 %   Neutro Abs 6.9 1.7 - 7.7 K/uL    Lymphocytes Relative 14 %   Lymphs Abs 1.3 0.7 - 4.0 K/uL   Monocytes Relative 11 %   Monocytes Absolute 1.0 0.1 - 1.0 K/uL   Eosinophils Relative 2 %   Eosinophils Absolute 0.1 0.0 - 0.5 K/uL  Basophils Relative 1 %   Basophils Absolute 0.1 0.0 - 0.1 K/uL   Immature Granulocytes 1 %   Abs Immature Granulocytes 0.11 (H) 0.00 - 0.07 K/uL    Comment: Performed at [facility], [address], [place], [place] [address]  Protime-INR     Status: None   Collection Time: 02/11/23  7:10 AM  Result Value Ref Range   Prothrombin Time 13.3 11.4 - 15.2 seconds   INR 1.0 0.8 - 1.2    Comment: (NOTE) INR goal varies based on device and disease states. Performed at [facility], [address], [place], [place] [address]   Lipid panel     Status: Abnormal   Collection Time: 02/11/23  7:10 AM  Result Value Ref Range   Cholesterol 186 0 - 200 mg/dL   Triglycerides 87 <098 mg/dL   HDL 34 (L) >11 mg/dL   Total CHOL/HDL Ratio 5.5 RATIO   VLDL 17 0 - 40 mg/dL   LDL Cholesterol 914 (H) 0 - 99 mg/dL    Comment:        Total Cholesterol/HDL:CHD Risk Coronary Heart Disease Risk Table                     Men   Women  1/2 Average Risk   3.4   3.3  Average Risk       5.0   4.4  2 X Average Risk   9.6   7.1  3 X Average Risk  23.4   11.0        Use the calculated Patient Ratio above and the CHD Risk Table to determine the patient's CHD Risk.        ATP III CLASSIFICATION (LDL):  <100     mg/dL   Optimal  782-956  mg/dL   Near or Above                    Optimal  130-159  mg/dL   Borderline  213-086  mg/dL   High  >578     mg/dL   Very High Performed at [facility], [address], [place], [place] [address]   CBG monitoring, ED     Status: Abnormal   Collection Time: 02/11/23  7:20 AM  Result Value Ref Range   Glucose-Capillary 117 (H) 70 - 99 mg/dL    Comment: Glucose reference range applies only to samples taken after fasting  for at least 8 hours.  I-stat chem 8, ED     Status: Abnormal   Collection Time: 02/11/23  7:27 AM  Result Value Ref Range   Sodium 137 135 - 145 mmol/L   Potassium 4.3 3.5 - 5.1 mmol/L   Chloride 100 98 - 111 mmol/L   BUN 10 8 - 23 mg/dL   Creatinine, Ser 9.52 (H) 0.61 - 1.24 mg/dL   Glucose, Bld 841 (H) 70 - 99 mg/dL    Comment: Glucose reference range applies only to samples taken after fasting for at least 8 hours.   Calcium, Ion 1.23 1.15 - 1.40 mmol/L   TCO2 27 22 - 32 mmol/L   Hemoglobin 17.0 13.0 - 17.0 g/dL   HCT 32.4 40.1 - 02.7 %  Troponin I (High Sensitivity)     Status: Abnormal   Collection Time: 02/11/23  9:10 AM  Result Value Ref Range   Troponin I (High Sensitivity) 300 (HH) <18 ng/L    Comment: DELTA CHECK  NOTED CRITICAL RESULT CALLED TO, READ BACK BY AND VERIFIED WITH TEETERS Z. RN @1001  02/11/23 MCLEAN K. (NOTE) Elevated high sensitivity troponin I (hsTnI) values and significant  changes across serial measurements may suggest ACS but many other  chronic and acute conditions are known to elevate hsTnI results.  Refer to the "Links" section for chest pain algorithms and additional  guidance. Performed at [facility], [address], [place], [place] [address]   Troponin I (High Sensitivity)     Status: Abnormal   Collection Time: 02/11/23 11:10 AM  Result Value Ref Range   Troponin I (High Sensitivity) 1,077 (HH) <18 ng/L    Comment: DELTA CHECK NOTED CRITICAL RESULT CALLED TO, READ BACK BY AND VERIFIED WITH Eulanda Dorion I. RN @1216  02/11/23 MCLEAN K. (NOTE) Elevated high sensitivity troponin I (hsTnI) values and significant  changes across serial measurements may suggest ACS but many other  chronic and acute conditions are known to elevate hsTnI results.  Refer to the "Links" section for chest pain algorithms and additional  guidance. Performed at [facility], [address], [place], [place] [address]     ECHOCARDIOGRAM COMPLETE  Result Date: 02/11/2023    ECHOCARDIOGRAM REPORT   Patient Name:   Richard Sellers Date of Exam: 02/11/2023 Medical Rec #:  [id number]        Height:       67.0 in Accession #:    [id number]       Weight:       265.0 lb Date of Birth:  Sep 21, 1956       BSA:          2.279 m Patient Age:    66 years         BP:           158/99 mmHg Patient Gender: M                HR:           91 bpm. Exam Location:  Inpatient Procedure: 2D Echo, Color Doppler, Cardiac Doppler and Intracardiac            Opacification Agent STAT ECHO Indications:    Chest Pain  History:        Patient has prior history of Echocardiogram examinations, most                 recent 08/27/2020. Risk Factors:Hypertension.  Sonographer:    Milbert Coulter Referring Phys: [id number] SHENG L HALEY  Sonographer Comments: Technically difficult study due to poor echo windows, suboptimal apical window, suboptimal parasternal window and patient is obese. Image acquisition challenging due to patient body habitus. IMPRESSIONS  1. Left ventricular ejection fraction, by estimation, is 55 to 60%. The left ventricle has normal function. The left ventricle demonstrates regional wall motion abnormalities (see scoring diagram/findings for description). There is mild left ventricular  hypertrophy. Left ventricular diastolic parameters are consistent with Grade I diastolic dysfunction (impaired relaxation).  2. Right ventricular systolic function is normal. The right ventricular size is normal.  3. Left atrial size was mildly dilated.  4. Right atrial size was mildly dilated.  5. The mitral valve is normal in structure. No evidence of mitral valve regurgitation. No evidence of mitral stenosis.  6. The aortic valve is normal in structure. Aortic valve regurgitation is not visualized. No aortic stenosis is present.  7. The inferior vena cava is normal in size with greater than 50% respiratory variability, suggesting right atrial  pressure of 3 mmHg.  FINDINGS  Left Ventricle: Left ventricular ejection fraction, by estimation, is 55 to 60%. The left ventricle has normal function. The left ventricle demonstrates regional wall motion abnormalities. Definity contrast agent was given IV to delineate the left ventricular endocardial borders. The left ventricular internal cavity size was normal in size. There is mild left ventricular hypertrophy. Left ventricular diastolic parameters are consistent with Grade I diastolic dysfunction (impaired relaxation).  LV Wall Scoring: The apical anterior segment is akinetic. Right Ventricle: The right ventricular size is normal. No increase in right ventricular wall thickness. Right ventricular systolic function is normal. Left Atrium: Left atrial size was mildly dilated. Right Atrium: Right atrial size was mildly dilated. Pericardium: There is no evidence of pericardial effusion. Mitral Valve: The mitral valve is normal in structure. No evidence of mitral valve regurgitation. No evidence of mitral valve stenosis. Tricuspid Valve: The tricuspid valve is normal in structure. Tricuspid valve regurgitation is not demonstrated. No evidence of tricuspid stenosis. Aortic Valve: The aortic valve is normal in structure. Aortic valve regurgitation is not visualized. No aortic stenosis is present. Aortic valve mean gradient measures 5.0 mmHg. Aortic valve peak gradient measures 9.2 mmHg. Aortic valve area, by VTI measures 2.77 cm. Pulmonic Valve: The pulmonic valve was normal in structure. Pulmonic valve regurgitation is not visualized. No evidence of pulmonic stenosis. Aorta: The aortic root is normal in size and structure. Venous: The inferior vena cava is normal in size with greater than 50% respiratory variability, suggesting right atrial pressure of 3 mmHg. IAS/Shunts: No atrial level shunt detected by color flow Doppler.  LEFT VENTRICLE PLAX 2D LVIDd:         4.00 cm   Diastology LVIDs:         2.80 cm   LV e' lateral:   7.94 cm/s LV  PW:         1.40 cm   LV E/e' lateral: 7.7 LV IVS:        1.30 cm LVOT diam:     2.20 cm LV SV:         70 LV SV Index:   31 LVOT Area:     3.80 cm  RIGHT VENTRICLE RV S prime:     17.40 cm/s TAPSE (M-mode): 2.7 cm LEFT ATRIUM             Index        RIGHT ATRIUM           Index LA diam:        3.60 cm 1.58 cm/m   RA Area:     23.50 cm LA Vol (A2C):   74.1 ml 32.51 ml/m  RA Volume:   65.70 ml  28.83 ml/m LA Vol (A4C):   78.9 ml 34.62 ml/m LA Biplane Vol: 78.6 ml 34.49 ml/m  AORTIC VALVE AV Area (Vmax):    2.78 cm AV Area (Vmean):   2.62 cm AV Area (VTI):     2.77 cm AV Vmax:           152.00 cm/s AV Vmean:          100.000 cm/s AV VTI:            0.251 m AV Peak Grad:      9.2 mmHg AV Mean Grad:      5.0 mmHg LVOT Vmax:         111.00 cm/s LVOT Vmean:        69.000 cm/s LVOT VTI:  0.183 m LVOT/AV VTI ratio: 0.73  AORTA Ao Root diam: 3.60 cm Ao Asc diam:  3.60 cm MITRAL VALVE MV Area (PHT): 3.74 cm    SHUNTS MV Decel Time: 203 msec    Systemic VTI:  0.18 m MV E velocity: 61.30 cm/s  Systemic Diam: 2.20 cm MV A velocity: 89.10 cm/s MV E/A ratio:  0.69 Donato Schultz MD Electronically signed by Donato Schultz MD Signature Date/Time: 02/11/2023/11:39:11 AM    Final    DG Chest Portable 1 View  Result Date: 02/11/2023 CLINICAL DATA:  Central chest pain beginning 0130 hours EXAM: PORTABLE CHEST 1 VIEW COMPARISON:  04/04/2022 FINDINGS: Artifact overlies the chest. Heart size upper limits of normal. Chronic aortic atherosclerotic calcification. Upper lungs are clear. No pulmonary edema. Lung bases are poorly seen due portable nature of the film and patient positioning. Consider two-view chest radiography if concern persists regarding the potential for basilar lung disease. No regional bone abnormality. IMPRESSION: No active disease suspected. Lung bases poorly seen due to portable nature of the film and patient positioning. Consider two-view chest radiography if concern persists. Electronically Signed   By:  Paulina Fusi M.D.   On: 02/11/2023 07:59    Pending Labs Unresulted Labs (From admission, onward)     Start     Ordered   02/12/23 0500  CBC  Daily,   R      02/11/23 1054   02/11/23 1800  Heparin level (unfractionated)  Once-Timed,   URGENT        02/11/23 1054   02/11/23 1109  Hemoglobin A1c  Add-on,   AD        02/11/23 1109   Signed and Held  HIV Antibody (routine testing w rflx)  (HIV Antibody (Routine testing w reflex) panel)  Once,   R        Signed and Held   Signed and Held  Basic metabolic panel  Tomorrow morning,   R        Signed and Held   Signed and Held  CBC  Tomorrow morning,   R        Signed and Held            Vitals/Pain Today's Vitals   02/11/23 0815 02/11/23 0911 02/11/23 0945 02/11/23 1252  BP: (!) 126/97  (!) 158/99 120/77  Pulse: 100  94 87  Resp:   (!) 21 19  Temp:   98.8 F (37.1 C) 98.6 F (37 C)  TempSrc:    Oral  SpO2: 91%  91% 93%  Weight:      Height:      PainSc:  3       Isolation Precautions No active isolations  Medications Medications  heparin ADULT infusion 100 units/mL (25000 units/253mL) (1,200 Units/hr Intravenous New Bag/Given 02/11/23 1105)  perflutren lipid microspheres (DEFINITY) IV suspension (3 mLs Intravenous Given 02/11/23 1151)  aspirin chewable tablet 81 mg (has no administration in time range)  atorvastatin (LIPITOR) tablet 20 mg (20 mg Oral Given 02/11/23 1338)  carvedilol (COREG) tablet 12.5 mg (12.5 mg Oral Given 02/11/23 1338)  amLODipine (NORVASC) tablet 5 mg (5 mg Oral Given 02/11/23 1338)  aspirin chewable tablet 324 mg (324 mg Oral Given 02/11/23 0727)  nitroGLYCERIN (NITROGLYN) 2 % ointment 1 inch (1 inch Topical Given 02/11/23 0727)  fentaNYL (SUBLIMAZE) injection 50 mcg (50 mcg Intravenous Given 02/11/23 0727)  ondansetron (ZOFRAN) injection 4 mg (4 mg Intravenous Given 02/11/23 0727)  metoCLOPramide (REGLAN) injection 10 mg (10  mg Intravenous Given 02/11/23 0942)  alum & mag hydroxide-simeth  (MAALOX/MYLANTA) 200-200-20 MG/5ML suspension 30 mL (30 mLs Oral Given 02/11/23 0951)    And  lidocaine (XYLOCAINE) 2 % viscous mouth solution 15 mL (15 mLs Oral Given 02/11/23 0951)  famotidine (PEPCID) IVPB 20 mg premix (0 mg Intravenous Stopped 02/11/23 1020)  heparin bolus via infusion 4,000 Units (4,000 Units Intravenous Bolus from Bag 02/11/23 1105)  ALPRAZolam (XANAX) tablet 1 mg (1 mg Oral Given 02/11/23 1149)    Mobility walks     Focused Assessments    R Recommendations: See Admitting Provider Note  Report given to:   Additional Notes:

## 2023-02-11 NOTE — Progress Notes (Addendum)
ANTICOAGULATION CONSULT NOTE  Pharmacy Consult for IV heparin Indication: chest pain/ACS  No Known Allergies  Patient Measurements: Height: 5\' 7"  (170.2 cm) Weight: 126.7 kg (279 lb 5.2 oz) IBW/kg (Calculated) : 66.1 Heparin Dosing Weight: 95 kg  Vital Signs: Temp: 98.3 F (36.8 C) (05/22 1750) Temp Source: Oral (05/22 1750) BP: 99/75 (05/22 1600) Pulse Rate: 76 (05/22 1700)  Labs: Recent Labs    02/11/23 0710 02/11/23 0727 02/11/23 0910 02/11/23 1323 02/11/23 1612 02/11/23 1749  HGB 16.4 17.0  --   --   --   --   HCT 48.6 50.0  --   --   --   --   PLT 276  --   --   --   --   --   LABPROT 13.3  --   --   --   --   --   INR 1.0  --   --   --   --   --   HEPARINUNFRC  --   --   --   --   --  0.13*  CREATININE 1.72* 1.70*  --   --   --   --   TROPONINIHS 33*  --    < > 1,923* 3,505* 5,928*   < > = values in this interval not displayed.     Estimated Creatinine Clearance: 54.6 mL/min (A) (by C-G formula based on SCr of 1.7 mg/dL (H)).   Medical History: Past Medical History:  Diagnosis Date   Allergy    seasonal allergies   Anxiety    on meds   Chronic kidney disease    hx of kidney stones   Depression    on meds   ED (erectile dysfunction)    GERD (gastroesophageal reflux disease)    not on meds-diet controlled-uses OTC meds for tx   Heart murmur    at age 67 years old, heard x1 , never again   History of kidney stones    Hyperlipidemia    on meds   Hypertension    on meds   Hypothyroidism    hx of-was on meds-   PONV (postoperative nausea and vomiting)    Warts, genital     Medications:  Medications Prior to Admission  Medication Sig Dispense Refill Last Dose   ALPRAZolam (XANAX) 1 MG tablet Take 1 tablet (1 mg total) by mouth every 6 (six) hours as needed for anxiety. for anxiety 120 tablet 5 02/10/2023   amLODipine (NORVASC) 5 MG tablet TAKE 1 TABLET BY MOUTH DAILY 90 tablet 0 02/10/2023   carvedilol (COREG) 12.5 MG tablet TAKE 1 TABLET BY  MOUTH TWICE A DAY WITH A MEAL (Patient taking differently: Take 12.5 mg by mouth daily.) 180 tablet 0 02/10/2023   diclofenac (VOLTAREN) 75 MG EC tablet TAKE ONE TABLET BY MOUTH TWICE A DAY 60 tablet 5 Past Week   losartan-hydrochlorothiazide (HYZAAR) 100-25 MG tablet TAKE 1 TABLET BY MOUTH DAILY 90 tablet 0 02/10/2023   omeprazole (PRILOSEC) 40 MG capsule TAKE 1 CAPSULE BY MOUTH DAILY 90 capsule 1 Past Week   ondansetron (ZOFRAN) 4 MG tablet Take 1 tablet (4 mg total) by mouth every 8 (eight) hours as needed for nausea or vomiting. 30 tablet 2 Past Week   testosterone cypionate (DEPOTESTOSTERONE CYPIONATE) 200 MG/ML injection INJECT 1 ML INTRAMUSCULARLY EVERY 7 DAYS (Patient taking differently: Inject 200 mg into the muscle every 7 (seven) days.) 4 mL 3 Past Month   triamcinolone cream (KENALOG) 0.1 %  Apply 1 Application topically 2 (two) times daily as needed. 45 g 5 Past Week   albuterol (VENTOLIN HFA) 108 (90 Base) MCG/ACT inhaler Inhale 2 puffs into the lungs every 4 (four) hours as needed for wheezing or shortness of breath. (Patient not taking: Reported on 02/11/2023) 8 g 1 Not Taking   Cholecalciferol (VITAMIN D) 125 MCG (5000 UT) CAPS Take 1 capsule by mouth once a week. (Patient not taking: Reported on 02/11/2023) 12 capsule 3 Not Taking   dextromethorphan-guaiFENesin (MUCINEX DM) 30-600 MG 12hr tablet Take 1 tablet by mouth 2 (two) times daily. (Patient not taking: Reported on 02/11/2023) 30 tablet 0 Not Taking   fluticasone (FLONASE) 50 MCG/ACT nasal spray Place 1 spray into both nostrils 2 (two) times daily. (Patient not taking: Reported on 02/11/2023) 16 g 1 Not Taking   gabapentin (NEURONTIN) 300 MG capsule Take 1 capsule (300 mg total) by mouth 3 (three) times daily. (Patient not taking: Reported on 02/11/2023) 90 capsule 3 Not Taking   halobetasol (ULTRAVATE) 0.05 % cream Apply topically 2 (two) times daily. APPLY TOPICALLY TWO TIMES A DAY (Patient not taking: Reported on 02/11/2023) 50 g 5 Not  Taking   montelukast (SINGULAIR) 10 MG tablet TAKE 1 TABLET BY MOUTH AT BEDTIME 90 tablet 3    SYRINGE-NEEDLE, DISP, 3 ML (SAFETY SYRINGES/NEEDLE) 20G X 1-1/2" 3 ML MISC 1 application by Does not apply route every 14 (fourteen) days. (Patient not taking: Reported on 02/11/2023) 50 each 2 Not Taking   tadalafil (CIALIS) 20 MG tablet Take 1 tablet (20 mg total) by mouth daily as needed for erectile dysfunction. (Patient not taking: Reported on 02/11/2023) 10 tablet 11 Not Taking   temazepam (RESTORIL) 15 MG capsule TAKE TWO CAPSULES BY MOUTH EVERY NIGHT AT BEDTIME (Patient not taking: Reported on 02/11/2023) 60 capsule 5 Not Taking   Scheduled:   amLODipine  5 mg Oral Daily   [START ON 02/12/2023] aspirin  81 mg Oral Daily   atorvastatin  20 mg Oral Daily   carvedilol  12.5 mg Oral BID WC   Chlorhexidine Gluconate Cloth  6 each Topical Daily   heparin  2,800 Units Intravenous Once   [START ON 02/12/2023] pneumococcal 20-valent conjugate vaccine  0.5 mL Intramuscular Tomorrow-1000     Assessment: 67 yoM with PMh CKD, HLD, HTN, hypothyroid, presenting 5/22 with chest pain. No EKG changes & mild troponin increase. Pharmacy to dose IV heparin for ACS. Plan is for cardiac cath in next 24 hr.  Baseline INR WNL Prior anticoagulation: none Note pt prescribed testosterone injections at home (increased thromboembolic risk)  Significant events:  Today, 02/11/2023: Heparin level 0.13 - subtherapeutic on heparin infusion at 1200 units/hr CBC: WNL SCr acutely elevated above CKD baseline (~1.4 earlier this month) No bleeding or infusion issues reported in handoff per nursing  Goal of Therapy: Heparin level 0.3-0.7 units/ml Monitor platelets by anticoagulation protocol: Yes  Plan: Heparin 2800 unit bolus Increase heparin infusion to 1500 units/hr Check heparin level ~ 6 hrs after rate change Daily CBC, daily heparin level once stable Monitor for signs of bleeding or thrombosis  Pricilla Riffle,  PharmD, BCPS Clinical Pharmacist 02/11/2023 7:23 PM

## 2023-02-12 ENCOUNTER — Encounter (HOSPITAL_COMMUNITY)
Admission: EM | Disposition: A | Discharge status: Home / Self Care | Payer: Self-pay | Source: Home / Self Care | Attending: Internal Medicine

## 2023-02-12 DIAGNOSIS — I214 Non-ST elevation (NSTEMI) myocardial infarction: Secondary | ICD-10-CM | POA: Diagnosis not present

## 2023-02-12 DIAGNOSIS — F418 Other specified anxiety disorders: Secondary | ICD-10-CM | POA: Diagnosis not present

## 2023-02-12 DIAGNOSIS — N179 Acute kidney failure, unspecified: Secondary | ICD-10-CM | POA: Diagnosis not present

## 2023-02-12 DIAGNOSIS — I161 Hypertensive emergency: Secondary | ICD-10-CM

## 2023-02-12 HISTORY — PX: LEFT HEART CATH AND CORONARY ANGIOGRAPHY: CATH118249

## 2023-02-12 LAB — CREATININE, SERUM
Creatinine, Ser: 1.42 mg/dL — ABNORMAL HIGH (ref 0.61–1.24)
GFR, Estimated: 54 mL/min — ABNORMAL LOW (ref >60)

## 2023-02-12 LAB — CBC
HCT: 44.6 % (ref 39.0–52.0)
HCT: 52.4 % — ABNORMAL HIGH (ref 39.0–52.0)
Hemoglobin: 14.6 g/dL (ref 13.0–17.0)
Hemoglobin: 16.8 g/dL (ref 13.0–17.0)
MCH: 33 pg (ref 26.0–34.0)
MCH: 32.4 pg (ref 26.0–34.0)
MCHC: 32.7 g/dL (ref 30.0–36.0)
MCHC: 32.1 g/dL (ref 30.0–36.0)
MCV: 100.7 fL — ABNORMAL HIGH (ref 80.0–100.0)
MCV: 101 fL — ABNORMAL HIGH (ref 80.0–100.0)
Platelets: 221 10*3/uL (ref 150–400)
Platelets: 252 10*3/uL (ref 150–400)
RBC: 4.43 MIL/uL (ref 4.22–5.81)
RBC: 5.19 MIL/uL (ref 4.22–5.81)
RDW: 14.5 % (ref 11.5–15.5)
RDW: 14.6 % (ref 11.5–15.5)
WBC: 8.7 10*3/uL (ref 4.0–10.5)
WBC: 7.8 10*3/uL (ref 4.0–10.5)
nRBC: 0 % (ref 0.0–0.2)
nRBC: 0 % (ref 0.0–0.2)

## 2023-02-12 LAB — SEDIMENTATION RATE: Sed Rate: 11 mm/hr (ref 0–16)

## 2023-02-12 LAB — HIV ANTIBODY (ROUTINE TESTING W REFLEX): HIV Screen 4th Generation wRfx: NONREACTIVE

## 2023-02-12 LAB — BASIC METABOLIC PANEL
Anion gap: 9 (ref 5–15)
BUN: 13 mg/dL (ref 8–23)
CO2: 26 mmol/L (ref 22–32)
Calcium: 8.4 mg/dL — ABNORMAL LOW (ref 8.9–10.3)
Chloride: 101 mmol/L (ref 98–111)
Creatinine, Ser: 1.57 mg/dL — ABNORMAL HIGH (ref 0.61–1.24)
GFR, Estimated: 48 mL/min — ABNORMAL LOW (ref >60)
Glucose, Bld: 107 mg/dL — ABNORMAL HIGH (ref 70–99)
Potassium: 3.8 mmol/L (ref 3.5–5.1)
Sodium: 136 mmol/L (ref 135–145)

## 2023-02-12 LAB — TROPONIN I (HIGH SENSITIVITY)
Troponin I (High Sensitivity): 10074 ng/L (ref <18)
Troponin I (High Sensitivity): 7602 ng/L (ref <18)

## 2023-02-12 LAB — HEPARIN LEVEL (UNFRACTIONATED): Heparin Unfractionated: 0.15 IU/mL — ABNORMAL LOW (ref 0.30–0.70)

## 2023-02-12 LAB — C-REACTIVE PROTEIN: CRP: 10.4 mg/dL — ABNORMAL HIGH (ref <1.0)

## 2023-02-12 LAB — TSH: TSH: 2.312 u[IU]/mL (ref 0.350–4.500)

## 2023-02-12 LAB — D-DIMER, QUANTITATIVE: D-Dimer, Quant: 0.53 ug/mL-FEU — ABNORMAL HIGH (ref 0.00–0.50)

## 2023-02-12 SURGERY — LEFT HEART CATH AND CORONARY ANGIOGRAPHY
Anesthesia: LOCAL

## 2023-02-12 MED ORDER — IOHEXOL 350 MG/ML SOLN
INTRAVENOUS | Status: DC | PRN
  Administered 2023-02-12: 55 mL

## 2023-02-12 MED ORDER — SODIUM CHLORIDE 0.9 % WEIGHT BASED INFUSION
3.0000 mL/kg/h | INTRAVENOUS | Status: DC
  Administered 2023-02-12: 3 mL/kg/h via INTRAVENOUS

## 2023-02-12 MED ORDER — ASPIRIN 81 MG PO CHEW
81.0000 mg | CHEWABLE_TABLET | Freq: Every day | ORAL | Status: DC
  Administered 2023-02-13: 81 mg via ORAL
  Filled 2023-02-12: qty 1

## 2023-02-12 MED ORDER — FENTANYL CITRATE (PF) 100 MCG/2ML IJ SOLN
INTRAMUSCULAR | Status: AC
  Filled 2023-02-12: qty 2

## 2023-02-12 MED ORDER — HYDRALAZINE HCL 20 MG/ML IJ SOLN
10.0000 mg | INTRAMUSCULAR | Status: DC | PRN
  Administered 2023-02-12: 10 mg via INTRAVENOUS

## 2023-02-12 MED ORDER — SODIUM CHLORIDE 0.9 % WEIGHT BASED INFUSION: 1.0000 mL/kg/h | INTRAVENOUS | Status: DC

## 2023-02-12 MED ORDER — ALPRAZOLAM 0.5 MG PO TABS
1.0000 mg | ORAL_TABLET | Freq: Four times a day (QID) | ORAL | Status: DC | PRN
  Administered 2023-02-12 – 2023-02-13 (×3): 1 mg via ORAL
  Filled 2023-02-12 (×3): qty 2

## 2023-02-12 MED ORDER — FENTANYL CITRATE (PF) 100 MCG/2ML IJ SOLN
INTRAMUSCULAR | Status: DC | PRN
  Administered 2023-02-12: 25 ug via INTRAVENOUS

## 2023-02-12 MED ORDER — CARVEDILOL 12.5 MG PO TABS
25.0000 mg | ORAL_TABLET | Freq: Two times a day (BID) | ORAL | Status: DC
  Administered 2023-02-12 – 2023-02-13 (×2): 25 mg via ORAL
  Filled 2023-02-12 (×2): qty 2

## 2023-02-12 MED ORDER — LIDOCAINE HCL (PF) 1 % IJ SOLN
INTRAMUSCULAR | Status: DC | PRN
  Administered 2023-02-12: 2 mL

## 2023-02-12 MED ORDER — MIDAZOLAM HCL 2 MG/2ML IJ SOLN
INTRAMUSCULAR | Status: AC
  Filled 2023-02-12: qty 2

## 2023-02-12 MED ORDER — LABETALOL HCL 5 MG/ML IV SOLN: 10.0000 mg | INTRAVENOUS | Status: DC | PRN

## 2023-02-12 MED ORDER — AMLODIPINE BESYLATE 10 MG PO TABS
10.0000 mg | ORAL_TABLET | Freq: Every day | ORAL | Status: DC
  Filled 2023-02-12: qty 1

## 2023-02-12 MED ORDER — SODIUM CHLORIDE 0.9% FLUSH: 3.0000 mL | INTRAVENOUS | Status: DC | PRN

## 2023-02-12 MED ORDER — FUROSEMIDE 10 MG/ML IJ SOLN
40.0000 mg | Freq: Once | INTRAMUSCULAR | Status: AC
  Administered 2023-02-12: 40 mg via INTRAVENOUS
  Filled 2023-02-12: qty 4

## 2023-02-12 MED ORDER — VERAPAMIL HCL 2.5 MG/ML IV SOLN
INTRAVENOUS | Status: DC | PRN
  Administered 2023-02-12: 10 mL via INTRA_ARTERIAL

## 2023-02-12 MED ORDER — HEPARIN SODIUM (PORCINE) 5000 UNIT/ML IJ SOLN
5000.0000 [IU] | Freq: Three times a day (TID) | INTRAMUSCULAR | Status: DC
  Administered 2023-02-12: 5000 [IU] via SUBCUTANEOUS
  Filled 2023-02-12: qty 1

## 2023-02-12 MED ORDER — HEPARIN (PORCINE) IN NACL 1000-0.9 UT/500ML-% IV SOLN
INTRAVENOUS | Status: DC | PRN
  Administered 2023-02-12 (×2): 500 mL

## 2023-02-12 MED ORDER — HYDRALAZINE HCL 25 MG PO TABS: 25.0000 mg | ORAL_TABLET | ORAL | Status: DC | PRN

## 2023-02-12 MED ORDER — VERAPAMIL HCL 2.5 MG/ML IV SOLN
INTRAVENOUS | Status: AC
  Filled 2023-02-12: qty 2

## 2023-02-12 MED ORDER — HYDRALAZINE HCL 20 MG/ML IJ SOLN
INTRAMUSCULAR | Status: AC
  Filled 2023-02-12: qty 1

## 2023-02-12 MED ORDER — HYDRALAZINE HCL 20 MG/ML IJ SOLN
INTRAMUSCULAR | Status: DC | PRN
  Administered 2023-02-12: 10 mg via INTRAVENOUS

## 2023-02-12 MED ORDER — LOSARTAN POTASSIUM 50 MG PO TABS
100.0000 mg | ORAL_TABLET | Freq: Every day | ORAL | Status: DC
  Administered 2023-02-12: 100 mg via ORAL
  Filled 2023-02-12 (×2): qty 2

## 2023-02-12 MED ORDER — ONDANSETRON HCL 4 MG/2ML IJ SOLN
INTRAMUSCULAR | Status: AC
  Filled 2023-02-12: qty 2

## 2023-02-12 MED ORDER — ASPIRIN 81 MG PO CHEW
81.0000 mg | CHEWABLE_TABLET | ORAL | Status: AC
  Administered 2023-02-12: 81 mg via ORAL
  Filled 2023-02-12: qty 1

## 2023-02-12 MED ORDER — HEPARIN BOLUS VIA INFUSION
2800.0000 [IU] | Freq: Once | INTRAVENOUS | Status: AC
  Administered 2023-02-12: 2800 [IU] via INTRAVENOUS
  Filled 2023-02-12: qty 2800

## 2023-02-12 MED ORDER — HEPARIN SODIUM (PORCINE) 1000 UNIT/ML IJ SOLN
INTRAMUSCULAR | Status: DC | PRN
  Administered 2023-02-12: 6000 [IU] via INTRAVENOUS

## 2023-02-12 MED ORDER — LIDOCAINE HCL (PF) 1 % IJ SOLN
INTRAMUSCULAR | Status: AC
  Filled 2023-02-12: qty 30

## 2023-02-12 MED ORDER — SODIUM CHLORIDE 0.9% FLUSH
3.0000 mL | Freq: Two times a day (BID) | INTRAVENOUS | Status: DC
  Administered 2023-02-12 – 2023-02-13 (×2): 3 mL via INTRAVENOUS

## 2023-02-12 MED ORDER — SODIUM CHLORIDE 0.9 % WEIGHT BASED INFUSION
3.0000 mL/kg/h | INTRAVENOUS | Status: DC
Start: 2023-02-13 — End: 2023-02-12

## 2023-02-12 MED ORDER — HEPARIN SODIUM (PORCINE) 1000 UNIT/ML IJ SOLN
INTRAMUSCULAR | Status: AC
  Filled 2023-02-12: qty 10

## 2023-02-12 MED ORDER — SODIUM CHLORIDE 0.9 % IV SOLN: 250.0000 mL | INTRAVENOUS | Status: DC | PRN

## 2023-02-12 MED ORDER — MIDAZOLAM HCL 2 MG/2ML IJ SOLN
INTRAMUSCULAR | Status: DC | PRN
  Administered 2023-02-12: 2 mg via INTRAVENOUS

## 2023-02-12 SURGICAL SUPPLY — 12 items
BAND CMPR LRG ZPHR (HEMOSTASIS) ×1
BAND ZEPHYR COMPRESS 30 LONG (HEMOSTASIS) IMPLANT
CATH 5FR JL3.5 JR4 ANG PIG MP (CATHETERS) IMPLANT
ELECT DEFIB PAD ADLT CADENCE (PAD) IMPLANT
GLIDESHEATH SLEND SS 6F .021 (SHEATH) IMPLANT
GUIDEWIRE INQWIRE 1.5J.035X260 (WIRE) IMPLANT
INQWIRE 1.5J .035X260CM (WIRE) ×1
KIT HEART LEFT (KITS) ×1 IMPLANT
PACK CARDIAC CATHETERIZATION (CUSTOM PROCEDURE TRAY) ×1 IMPLANT
SHEATH PROBE COVER 6X72 (BAG) IMPLANT
TRANSDUCER W/STOPCOCK (MISCELLANEOUS) ×1 IMPLANT
TUBING CIL FLEX 10 FLL-RA (TUBING) ×1 IMPLANT

## 2023-02-12 NOTE — Hospital Course (Addendum)
[name] is a 67 yo male with PMH HTN, hypothyroidism, GERD, medication noncompliance who presents to the hospital with abdominal pain, chest pain, nausea.  He has been having nausea and right upper quadrant abdominal discomfort for approximately 2 to 3 weeks.  Right upper quadrant ultrasound was unremarkable when previously performed. On workup on admission he had rapidly uptrending troponin values.  He was started on aspirin, heparin, and nitroglycerin ointment.  Cardiology was consulted. He underwent left heart cath on 02/12/2023 which showed mild nonobstructive CAD. He was started on aspirin and Plavix prior to discharge. He underwent further workup regarding elevated troponins.  Due to elevated CRP, he was started on colchicine per cardiology as well. Blood pressure regimen was modified and he is continued on Coreg 25 mg twice daily, losartan 100 mg daily.  He was recommended to keep a blood pressure log at discharge and if blood pressures uptrend, he may need to be resumed on amlodipine as well.

## 2023-02-12 NOTE — Progress Notes (Addendum)
Received patient from [facility] via [facility].  Skin warm and dry resp even and unlabored, with chest pain 3/10.  Pt placed on monitor, IVF fluids infusing with Heparin.  Three IV lines patent.  Consent signed .  No acute distress noted at this time. Patient to cath procedure via stretcher.

## 2023-02-12 NOTE — Progress Notes (Signed)
Pt c/o chest pain 5/10 same as previously. Aching and pressure to epigastric region radiating to left lower chest. Nitro given x 1 pain still 5/10, morphine 1 mg given for chest pain. Pt verbalized improvement after dose of morphine. VS stable, no acute distress, pt mental status unchanged.

## 2023-02-12 NOTE — Assessment & Plan Note (Signed)
Asymptomatic at this time 

## 2023-02-12 NOTE — Progress Notes (Signed)
ANTICOAGULATION CONSULT NOTE  Pharmacy Consult for IV heparin Indication: chest pain/ACS  No Known Allergies  Patient Measurements: Height: 5\' 7"  (170.2 cm) Weight: 126.7 kg (279 lb 5.2 oz) IBW/kg (Calculated) : 66.1 Heparin Dosing Weight: 95 kg  Vital Signs: Temp: 98.3 F (36.8 C) (05/23 0400) Temp Source: Oral (05/23 0400) BP: 127/89 (05/23 0000) Pulse Rate: 82 (05/23 0041)  Labs: Recent Labs    02/11/23 0710 02/11/23 0727 02/11/23 0910 02/11/23 1612 02/11/23 1749 02/11/23 2342 02/12/23 0310  HGB 16.4 17.0  --   --   --   --  14.6  HCT 48.6 50.0  --   --   --   --  44.6  PLT 276  --   --   --   --   --  221  LABPROT 13.3  --   --   --   --   --   --   INR 1.0  --   --   --   --   --   --   HEPARINUNFRC  --   --   --   --  0.13*  --  0.15*  CREATININE 1.72* 1.70*  --   --   --   --  1.57*  TROPONINIHS 33*  --    < > 3,505* 5,928* 10,074*  --    < > = values in this interval not displayed.     Estimated Creatinine Clearance: 59.1 mL/min (A) (by C-G formula based on SCr of 1.57 mg/dL (H)).   Medical History: Past Medical History:  Diagnosis Date   Allergy    seasonal allergies   Anxiety    on meds   Chronic kidney disease    hx of kidney stones   Depression    on meds   ED (erectile dysfunction)    GERD (gastroesophageal reflux disease)    not on meds-diet controlled-uses OTC meds for tx   Heart murmur    at age 67 years old, heard x1 , never again   History of kidney stones    Hyperlipidemia    on meds   Hypertension    on meds   Hypothyroidism    hx of-was on meds-   PONV (postoperative nausea and vomiting)    Warts, genital     Medications:  Medications Prior to Admission  Medication Sig Dispense Refill Last Dose   ALPRAZolam (XANAX) 1 MG tablet Take 1 tablet (1 mg total) by mouth every 6 (six) hours as needed for anxiety. for anxiety 120 tablet 5 02/10/2023   amLODipine (NORVASC) 5 MG tablet TAKE 1 TABLET BY MOUTH DAILY 90 tablet 0  02/10/2023   carvedilol (COREG) 12.5 MG tablet TAKE 1 TABLET BY MOUTH TWICE A DAY WITH A MEAL (Patient taking differently: Take 12.5 mg by mouth daily.) 180 tablet 0 02/10/2023   diclofenac (VOLTAREN) 75 MG EC tablet TAKE ONE TABLET BY MOUTH TWICE A DAY 60 tablet 5 Past Week   losartan-hydrochlorothiazide (HYZAAR) 100-25 MG tablet TAKE 1 TABLET BY MOUTH DAILY 90 tablet 0 02/10/2023   omeprazole (PRILOSEC) 40 MG capsule TAKE 1 CAPSULE BY MOUTH DAILY 90 capsule 1 Past Week   ondansetron (ZOFRAN) 4 MG tablet Take 1 tablet (4 mg total) by mouth every 8 (eight) hours as needed for nausea or vomiting. 30 tablet 2 Past Week   testosterone cypionate (DEPOTESTOSTERONE CYPIONATE) 200 MG/ML injection INJECT 1 ML INTRAMUSCULARLY EVERY 7 DAYS (Patient taking differently: Inject 200 mg into the muscle  every 7 (seven) days.) 4 mL 3 Past Month   triamcinolone cream (KENALOG) 0.1 % Apply 1 Application topically 2 (two) times daily as needed. 45 g 5 Past Week   albuterol (VENTOLIN HFA) 108 (90 Base) MCG/ACT inhaler Inhale 2 puffs into the lungs every 4 (four) hours as needed for wheezing or shortness of breath. (Patient not taking: Reported on 02/11/2023) 8 g 1 Not Taking   Cholecalciferol (VITAMIN D) 125 MCG (5000 UT) CAPS Take 1 capsule by mouth once a week. (Patient not taking: Reported on 02/11/2023) 12 capsule 3 Not Taking   dextromethorphan-guaiFENesin (MUCINEX DM) 30-600 MG 12hr tablet Take 1 tablet by mouth 2 (two) times daily. (Patient not taking: Reported on 02/11/2023) 30 tablet 0 Not Taking   fluticasone (FLONASE) 50 MCG/ACT nasal spray Place 1 spray into both nostrils 2 (two) times daily. (Patient not taking: Reported on 02/11/2023) 16 g 1 Not Taking   gabapentin (NEURONTIN) 300 MG capsule Take 1 capsule (300 mg total) by mouth 3 (three) times daily. (Patient not taking: Reported on 02/11/2023) 90 capsule 3 Not Taking   halobetasol (ULTRAVATE) 0.05 % cream Apply topically 2 (two) times daily. APPLY TOPICALLY TWO  TIMES A DAY (Patient not taking: Reported on 02/11/2023) 50 g 5 Not Taking   montelukast (SINGULAIR) 10 MG tablet TAKE 1 TABLET BY MOUTH AT BEDTIME 90 tablet 3    SYRINGE-NEEDLE, DISP, 3 ML (SAFETY SYRINGES/NEEDLE) 20G X 1-1/2" 3 ML MISC 1 application by Does not apply route every 14 (fourteen) days. (Patient not taking: Reported on 02/11/2023) 50 each 2 Not Taking   tadalafil (CIALIS) 20 MG tablet Take 1 tablet (20 mg total) by mouth daily as needed for erectile dysfunction. (Patient not taking: Reported on 02/11/2023) 10 tablet 11 Not Taking   temazepam (RESTORIL) 15 MG capsule TAKE TWO CAPSULES BY MOUTH EVERY NIGHT AT BEDTIME (Patient not taking: Reported on 02/11/2023) 60 capsule 5 Not Taking   Scheduled:   amLODipine  5 mg Oral Daily   aspirin  81 mg Oral Pre-Cath   [START ON 02/13/2023] aspirin  81 mg Oral Daily   atorvastatin  20 mg Oral Daily   carvedilol  12.5 mg Oral BID WC   Chlorhexidine Gluconate Cloth  6 each Topical Daily   heparin  2,800 Units Intravenous Once   pneumococcal 20-valent conjugate vaccine  0.5 mL Intramuscular Tomorrow-1000     Assessment: 67 yoM with PMh CKD, HLD, HTN, hypothyroid, presenting 5/22 with chest pain. No EKG changes & mild troponin increase. Pharmacy to dose IV heparin for ACS. Plan is for cardiac cath in next 24 hr.  Baseline INR WNL Prior anticoagulation: none Note pt prescribed testosterone injections at home (increased thromboembolic risk)  Significant events:  Today, 02/12/2023: Heparin level 0.15 - subtherapeutic on heparin infusion at 1500 units/hr CBC: WNL SCr acutely elevated above CKD baseline (~1.4 earlier this month) but trending now and 1.57 this AM No bleeding or infusion issues reported   Goal of Therapy: Heparin level 0.3-0.7 units/ml Monitor platelets by anticoagulation protocol: Yes  Plan: Rebolus Heparin 2800 unit IV x 1 Increase heparin infusion to 1800 units/hr Check heparin level ~ 6 hrs after rate change Daily CBC,  daily heparin level once stable Monitor for signs of bleeding or thrombosis Plan for cath today  Maryellen Pile, PharmD 02/12/2023 4:30 AM

## 2023-02-12 NOTE — Progress Notes (Signed)
Zephyr band removed. Insertion site CDI. Level zero. Pt instructed to not use right arm.

## 2023-02-12 NOTE — Interval H&P Note (Signed)
History and Physical Interval Note:  02/12/2023 8:52 AM  Richard Sellers  has presented today for surgery, with the diagnosis of chest pain.  The various methods of treatment have been discussed with the patient and family. After consideration of risks, benefits and other options for treatment, the patient has consented to  Procedure(s): LEFT HEART CATH AND CORONARY ANGIOGRAPHY (N/A) as a surgical intervention.  The patient's history has been reviewed, patient examined, no change in status, stable for surgery.  I have reviewed the patient's chart and labs.  Questions were answered to the patient's satisfaction.    Cath Lab Visit (complete for each Cath Lab visit)  Clinical Evaluation Leading to the Procedure:   ACS: Yes.    Non-ACS:    Anginal Classification: CCS III  Anti-ischemic medical therapy: Maximal Therapy (2 or more classes of medications)  Non-Invasive Test Results: No non-invasive testing performed  Prior CABG: No previous CABG       Theron Arista [facility] 02/12/2023 8:52 AM

## 2023-02-12 NOTE — Progress Notes (Signed)
    Patient Name: Richard Sellers           DOB: 09-02-1956  MRN: [id number]      Admission Date: 02/11/2023  Attending Provider: Maryln Gottron, MD  Primary Diagnosis: NSTEMI (non-ST elevated myocardial infarction) [facility])   Level of care: Stepdown    CROSS COVER NOTE   Date of Service   02/12/2023   Richard Sellers, 67 y.o. male, was admitted on 02/11/2023 for NSTEMI (non-ST elevated myocardial infarction) (HCC) in the setting of angina and elevated troponin.  Initial EKG did not show ischemic changes except for minimal ST depression in lateral leads.  Patient is currently on IV heparin.  Cardiology has plans for heart cath tomorrow.   HPI/Events of Note   2320- Patient complaining of chest pain, 7/10 in epigastric region that radiates to left lower chest.  Describing pain as aching and pressure.   Patient is scheduled for cardiac cath in the morning.  Troponin has been elevated.  Hemodynamically stable.  Orders placed for sublingual nitroglycerin, EKG, troponin level.    0015 - chest pain resolved after second nitroglycerin dose. EKG impression-NSR without ST segment changes.    0200-patient remains chest pain-free, hemodynamically stable, however troponin rising.    Spoke with Dr. Juel Burrow (cardiology) regarding uptrending troponin --> 3,505 --> 5,928 --> now 10, 074.  Recommendation made to continue treating patient with therapeutic heparin IV, treat chest pain with sublingual nitroglycerin (if SL nitro not effective, try IV nitro).  As long as patient remains chest pain-free and hemodynamically stable, plan is still for cardiac cath tomorrow.   Interventions/ Plan   Sublingual nitroglycerin EKG Troponin Cardiology recommendation        Anthoney Harada, DNP, ACNPC- AG [facility] [place]

## 2023-02-12 NOTE — Progress Notes (Signed)
Date and time results received: 02/12/23 0039 (use smartphrase ".now" to insert current time)  Test: troponin Critical Value: 10,074  Name of Provider Notified: Liana Crocker, NP at 0040 02/12/23  Orders Received? Or Actions Taken?:  Virgel Manifold, NP confirmed that message was received, no new orders at this time. Pt resting comfortably, no chest pain, SOB, palpitations, no change in mental status.

## 2023-02-12 NOTE — Assessment & Plan Note (Signed)
-   Continue home Xanax.  Verified on database

## 2023-02-12 NOTE — Assessment & Plan Note (Addendum)
-   Patient endorses not taking his home regimen as prescribed.  He has been prescribed amlodipine, Coreg, losartan-HCTZ but does not take these routinely - see HTN

## 2023-02-12 NOTE — Assessment & Plan Note (Addendum)
-   Blood pressure improved after resuming antihypertensives - Regimen at discharge includes Coreg 25 mg twice daily and losartan 100 mg daily -Patient instructed if blood pressure still above goal at discharge, he may need to be resumed back on amlodipine.  He has been told to try and keep a blood pressure journal and bring to his cardiology follow-up

## 2023-02-12 NOTE — Progress Notes (Signed)
Chaplain engaged in an initial visit with Tinnie Gens. Chaplain provided Scientist, water quality, Healthcare POA packet and education. Chaplain provided reflective listening and support as Trey Paula shared about his faith and the grief of losing his mom. Trey Paula has a desire to prepare things correctly for his only son.   Chaplain offered compassionate presence and a blessing of healing and strength as he works to "get things in order." Trey Paula would like to have a more in depth conversation with his son about him becoming his healthcare agent.     02/12/23 1500  Spiritual Encounters  Type of Visit Initial  Care provided to: Patient  Reason for visit Advance directives  Spiritual Framework  Presenting Themes Community and relationships;Goals in life/care;Significant life change

## 2023-02-12 NOTE — Assessment & Plan Note (Addendum)
-   No baseline for comparison - Creatinine 1.57 on admission.  At risk for CKD given uncontrolled hypertension -Creatinine down to 1.37 at discharge

## 2023-02-12 NOTE — Assessment & Plan Note (Addendum)
-   No Synthroid noted on home med rec -TSH checked and is normal, 2.312

## 2023-02-12 NOTE — Progress Notes (Signed)
Progress Note    Richard Sellers   ZOX:[id number]  DOB: 24-Jun-1956  DOA: 02/11/2023     1 PCP: Nelwyn Salisbury, MD  Initial CC: [facility] Course: Mr. Buchmeier is a 67 yo male with PMH HTN, hypothyroidism, GERD, medication noncompliance who presents to the hospital with abdominal pain, chest pain, nausea.  He has been having nausea and right upper quadrant abdominal discomfort for approximately 2 to 3 weeks.  Right upper quadrant ultrasound was unremarkable when previously performed. On workup on admission he had rapidly uptrending troponin values.  He was started on aspirin, heparin, and nitroglycerin ointment.  Cardiology was consulted. He underwent left heart cath on 02/12/2023 which showed mild nonobstructive CAD.  Interval History:  Seen this afternoon after returning from heart cath.  Chest pain much improved and he ranks it 2/10. We discussed lifestyle changes including pursuing some weight loss.  Assessment and Plan: * NSTEMI (non-ST elevated myocardial infarction) (HCC) - presented with CP and uptrending troponins (have not peaked yet; last 10,074) - cardiology following as well - s/p LHC on 5/23, showed mild non-obs CAD and moderately elevated LVEDP - trop leak may be due to uncontrolled BP vs myocarditis of other etiology  - follow up ESR, CRP - currently his CP is 2/10 and much improved; working on BP control as well - heparin discontinued   Hypertensive emergency - Patient endorses not taking his home regimen as prescribed.  He has been prescribed amlodipine, Coreg, losartan-HCTZ but does not take these routinely - Continue current regimen and monitor for BP response - Also has PRN labetalol or hydralazine  AKI (acute kidney injury) (HCC) - No baseline for comparison - Creatinine 1.57 on admission.  At risk for CKD given uncontrolled hypertension - Continue trending renal function on fluids  Depression with anxiety - Continue home Xanax.  Verified on  database  Hypothyroidism - No Synthroid noted on home med rec - Repeat TSH  GERD - Asymptomatic at this time  Essential hypertension - See hypertensive emergency - Home regimen to be determined prior to discharge as may need to be adjusted   Old records reviewed in assessment of this patient  Antimicrobials:   DVT prophylaxis:  heparin injection 5,000 Units Start: 02/12/23 2200 SCDs Start: 02/11/23 1422   Code Status:   Code Status: Full Code  Mobility Assessment (last 72 hours)     Mobility Assessment   No documentation.           Barriers to discharge: none Disposition Plan:  Home Status is: Inpt  Objective: Blood pressure (!) 173/134, pulse (!) 107, temperature 98.3 F (36.8 C), temperature source Oral, resp. rate 18, height 5\' 7"  (1.702 m), weight 127.4 kg, SpO2 95 %.  Examination:  Physical Exam Constitutional:      General: He is not in acute distress.    Appearance: Normal appearance.  HENT:     Head: Normocephalic and atraumatic.     Mouth/Throat:     Mouth: Mucous membranes are moist.  Eyes:     Extraocular Movements: Extraocular movements intact.  Cardiovascular:     Rate and Rhythm: Normal rate and regular rhythm.  Pulmonary:     Effort: Pulmonary effort is normal. No respiratory distress.     Breath sounds: Normal breath sounds. No wheezing.  Abdominal:     General: Bowel sounds are normal. There is no distension.     Palpations: Abdomen is soft.     Tenderness: There is no abdominal  tenderness.  Musculoskeletal:        General: Normal range of motion.     Cervical back: Normal range of motion and neck supple.  Skin:    General: Skin is warm and dry.  Neurological:     General: No focal deficit present.     Mental Status: He is alert.  Psychiatric:        Mood and Affect: Mood normal.        Behavior: Behavior normal.      Consultants:  Cardiology  Procedures:  02/12/23: LHC  Data Reviewed: Results for orders placed or  performed during the hospital encounter of 02/11/23 (from the past 24 hour(s))  MRSA Next Gen by PCR, Nasal     Status: None   Collection Time: 02/11/23  3:26 PM   Specimen: Nasal Mucosa; Nasal Swab  Result Value Ref Range   MRSA by PCR Next Gen NOT DETECTED NOT DETECTED  Troponin I (High Sensitivity)     Status: Abnormal   Collection Time: 02/11/23  4:12 PM  Result Value Ref Range   Troponin I (High Sensitivity) 3,505 (HH) <18 ng/L  Heparin level (unfractionated)     Status: Abnormal   Collection Time: 02/11/23  5:49 PM  Result Value Ref Range   Heparin Unfractionated 0.13 (L) 0.30 - 0.70 IU/mL  Troponin I (High Sensitivity)     Status: Abnormal   Collection Time: 02/11/23  5:49 PM  Result Value Ref Range   Troponin I (High Sensitivity) 5,928 (HH) <18 ng/L  Troponin I (High Sensitivity)     Status: Abnormal   Collection Time: 02/11/23 11:42 PM  Result Value Ref Range   Troponin I (High Sensitivity) 10,074 (HH) <18 ng/L  HIV Antibody (routine testing w rflx)     Status: None   Collection Time: 02/12/23  3:10 AM  Result Value Ref Range   HIV Screen 4th Generation wRfx Non Reactive Non Reactive  CBC     Status: Abnormal   Collection Time: 02/12/23  3:10 AM  Result Value Ref Range   WBC 8.7 4.0 - 10.5 K/uL   RBC 4.43 4.22 - 5.81 MIL/uL   Hemoglobin 14.6 13.0 - 17.0 g/dL   HCT 46.9 62.9 - 52.8 %   MCV 100.7 (H) 80.0 - 100.0 fL   MCH 33.0 26.0 - 34.0 pg   MCHC 32.7 30.0 - 36.0 g/dL   RDW 41.3 24.4 - 01.0 %   Platelets 221 150 - 400 K/uL   nRBC 0.0 0.0 - 0.2 %  Basic metabolic panel     Status: Abnormal   Collection Time: 02/12/23  3:10 AM  Result Value Ref Range   Sodium 136 135 - 145 mmol/L   Potassium 3.8 3.5 - 5.1 mmol/L   Chloride 101 98 - 111 mmol/L   CO2 26 22 - 32 mmol/L   Glucose, Bld 107 (H) 70 - 99 mg/dL   BUN 13 8 - 23 mg/dL   Creatinine, Ser 2.72 (H) 0.61 - 1.24 mg/dL   Calcium 8.4 (L) 8.9 - 10.3 mg/dL   GFR, Estimated 48 (L) >60 mL/min   Anion gap 9 5 - 15   Heparin level (unfractionated)     Status: Abnormal   Collection Time: 02/12/23  3:10 AM  Result Value Ref Range   Heparin Unfractionated 0.15 (L) 0.30 - 0.70 IU/mL  D-dimer, quantitative     Status: Abnormal   Collection Time: 02/12/23  2:00 PM  Result Value Ref Range  D-Dimer, Quant 0.53 (H) 0.00 - 0.50 ug/mL-FEU  CBC     Status: Abnormal   Collection Time: 02/12/23  2:00 PM  Result Value Ref Range   WBC 7.8 4.0 - 10.5 K/uL   RBC 5.19 4.22 - 5.81 MIL/uL   Hemoglobin 16.8 13.0 - 17.0 g/dL   HCT 40.9 (H) 81.1 - 91.4 %   MCV 101.0 (H) 80.0 - 100.0 fL   MCH 32.4 26.0 - 34.0 pg   MCHC 32.1 30.0 - 36.0 g/dL   RDW 78.2 95.6 - 21.3 %   Platelets 252 150 - 400 K/uL   nRBC 0.0 0.0 - 0.2 %  Creatinine, serum     Status: Abnormal   Collection Time: 02/12/23  2:00 PM  Result Value Ref Range   Creatinine, Ser 1.42 (H) 0.61 - 1.24 mg/dL   GFR, Estimated 54 (L) >60 mL/min    I have reviewed pertinent nursing notes, vitals, labs, and images as necessary. I have ordered labwork to follow up on as indicated.  I have reviewed the last notes from staff over past 24 hours. I have discussed patient's care plan and test results with nursing staff, CM/SW, and other staff as appropriate.  Time spent: Greater than 50% of the 55 minute visit was spent in counseling/coordination of care for the patient as laid out in the A&P.   LOS: 1 day   Lewie Chamber, MD [facility] 02/12/2023, 3:21 PM

## 2023-02-12 NOTE — Assessment & Plan Note (Addendum)
-   presented with CP and uptrending troponins (have not peaked yet; last 10,074) - cardiology followed - s/p LHC on 5/23, showed mild non-obs CAD and moderately elevated LVEDP - trop leak may be due to uncontrolled BP vs myocarditis of other etiology  - CRP elevated and patient started on colchicine per cardiology as well - asa and plavix continued also at discharge

## 2023-02-12 NOTE — Progress Notes (Addendum)
Rounding Note    Patient Name: Richard Sellers Date of Encounter: 02/12/2023  [facility] Cardiologist: Thomasene Ripple, DO   Subjective   Plans for cardiac catheterization today.  Patient was not able to sleep very much through the night due to intermittent chest pains.  He has been getting morphine with some relief.  No chest pain currently.  Inpatient Medications    Scheduled Meds:  amLODipine  5 mg Oral Daily   [START ON 02/13/2023] aspirin  81 mg Oral Daily   atorvastatin  20 mg Oral Daily   carvedilol  12.5 mg Oral BID WC   Chlorhexidine Gluconate Cloth  6 each Topical Daily   pneumococcal 20-valent conjugate vaccine  0.5 mL Intramuscular Tomorrow-1000   Continuous Infusions:  0.9 % NaCl with KCl 20 mEq / L 100 mL/hr at 02/12/23 0440   sodium chloride 1 mL/kg/hr (02/12/23 0617)   heparin 1,800 Units/hr (02/12/23 0437)   PRN Meds: acetaminophen **OR** acetaminophen, albuterol, ondansetron **OR** ondansetron (ZOFRAN) IV, mouth rinse, mouth rinse, senna-docusate, traZODone   Vital Signs    Vitals:   02/12/23 0436 02/12/23 0444 02/12/23 0500 02/12/23 0600  BP: (!) 146/102  (!) 130/94 (!) 140/72  Pulse: 77 74 85 82  Resp: (!) 21 (!) 22 (!) 25 (!) 27  Temp:      TempSrc:      SpO2: 93% 94% 95% 97%  Weight:      Height:        Intake/Output Summary (Last 24 hours) at 02/12/2023 0749 Last data filed at 02/12/2023 0534 Gross per 24 hour  Intake 1468.19 ml  Output 1050 ml  Net 418.19 ml      02/12/2023    1:58 AM 02/11/2023    3:20 PM 02/11/2023    7:05 AM  Last 3 Weights  Weight (lbs) 280 lb 13.9 oz 279 lb 5.2 oz 265 lb  Weight (kg) 127.4 kg 126.7 kg 120.203 kg      Telemetry    Normal sinus rhythm, heart rate 70 to 80s.  Some strips appear to be sinus arrhythmia.- Personally Reviewed  ECG    No new tracings- Personally Reviewed  Physical Exam   GEN: No acute distress.   Neck: No JVD Cardiac: RRR, no murmurs, rubs, or gallops.  Respiratory:  Clear to auscultation bilaterally. GI: Soft, nontender, non-distended  MS: No edema; No deformity. Neuro:  Nonfocal  Psych: Normal affect   Labs    High Sensitivity Troponin:   Recent Labs  Lab 02/11/23 1110 02/11/23 1323 02/11/23 1612 02/11/23 1749 02/11/23 2342  TROPONINIHS 1,077* 1,923* 3,505* 5,928* 10,074*     Chemistry Recent Labs  Lab 02/11/23 0710 02/11/23 0727 02/12/23 0310  NA 135 137 136  K 4.3 4.3 3.8  CL 101 100 101  CO2 26  --  26  GLUCOSE 123* 118* 107*  BUN 12 10 13   CREATININE 1.72* 1.70* 1.57*  CALCIUM 9.2  --  8.4*  MG 1.7  --   --   PROT 7.7  --   --   ALBUMIN 3.8  --   --   AST 27  --   --   ALT 24  --   --   ALKPHOS 65  --   --   BILITOT 1.1  --   --   GFRNONAA 43*  --  48*  ANIONGAP 8  --  9    Lipids  Recent Labs  Lab 02/11/23 0710  CHOL 186  TRIG 87  HDL 34*  LDLCALC 135*  CHOLHDL 5.5    Hematology Recent Labs  Lab 02/11/23 0710 02/11/23 0727 02/12/23 0310  WBC 9.6  --  8.7  RBC 4.85  --  4.43  HGB 16.4 17.0 14.6  HCT 48.6 50.0 44.6  MCV 100.2*  --  100.7*  MCH 33.8  --  33.0  MCHC 33.7  --  32.7  RDW 14.7  --  14.5  PLT 276  --  221   Thyroid No results for input(s): "TSH", "FREET4" in the last 168 hours.  BNPNo results for input(s): "BNP", "PROBNP" in the last 168 hours.  DDimer No results for input(s): "DDIMER" in the last 168 hours.   Radiology    ECHOCARDIOGRAM COMPLETE  Result Date: 02/11/2023    ECHOCARDIOGRAM REPORT   Patient Name:   Richard Sellers Date of Exam: 02/11/2023 Medical Rec #:  [id number]        Height:       67.0 in Accession #:    [id number]       Weight:       265.0 lb Date of Birth:  1956/02/21       BSA:          2.279 m Patient Age:    67 years         BP:           158/99 mmHg Patient Gender: M                HR:           91 bpm. Exam Location:  Inpatient Procedure: 2D Echo, Color Doppler, Cardiac Doppler and Intracardiac            Opacification Agent STAT ECHO Indications:    Chest Pain   History:        Patient has prior history of Echocardiogram examinations, most                 recent 08/27/2020. Risk Factors:Hypertension.  Sonographer:    Milbert Coulter Referring Phys: [id number] SHENG L HALEY  Sonographer Comments: Technically difficult study due to poor echo windows, suboptimal apical window, suboptimal parasternal window and patient is obese. Image acquisition challenging due to patient body habitus. IMPRESSIONS  1. Left ventricular ejection fraction, by estimation, is 55 to 60%. The left ventricle has normal function. The left ventricle demonstrates regional wall motion abnormalities (see scoring diagram/findings for description). There is mild left ventricular  hypertrophy. Left ventricular diastolic parameters are consistent with Grade I diastolic dysfunction (impaired relaxation).  2. Right ventricular systolic function is normal. The right ventricular size is normal.  3. Left atrial size was mildly dilated.  4. Right atrial size was mildly dilated.  5. The mitral valve is normal in structure. No evidence of mitral valve regurgitation. No evidence of mitral stenosis.  6. The aortic valve is normal in structure. Aortic valve regurgitation is not visualized. No aortic stenosis is present.  7. The inferior vena cava is normal in size with greater than 50% respiratory variability, suggesting right atrial pressure of 3 mmHg. FINDINGS  Left Ventricle: Left ventricular ejection fraction, by estimation, is 55 to 60%. The left ventricle has normal function. The left ventricle demonstrates regional wall motion abnormalities. Definity contrast agent was given IV to delineate the left ventricular endocardial borders. The left ventricular internal cavity size was normal in size. There is mild left ventricular hypertrophy. Left ventricular diastolic parameters are consistent with Grade  I diastolic dysfunction (impaired relaxation).  LV Wall Scoring: The apical anterior segment is akinetic. Right Ventricle:  The right ventricular size is normal. No increase in right ventricular wall thickness. Right ventricular systolic function is normal. Left Atrium: Left atrial size was mildly dilated. Right Atrium: Right atrial size was mildly dilated. Pericardium: There is no evidence of pericardial effusion. Mitral Valve: The mitral valve is normal in structure. No evidence of mitral valve regurgitation. No evidence of mitral valve stenosis. Tricuspid Valve: The tricuspid valve is normal in structure. Tricuspid valve regurgitation is not demonstrated. No evidence of tricuspid stenosis. Aortic Valve: The aortic valve is normal in structure. Aortic valve regurgitation is not visualized. No aortic stenosis is present. Aortic valve mean gradient measures 5.0 mmHg. Aortic valve peak gradient measures 9.2 mmHg. Aortic valve area, by VTI measures 2.77 cm. Pulmonic Valve: The pulmonic valve was normal in structure. Pulmonic valve regurgitation is not visualized. No evidence of pulmonic stenosis. Aorta: The aortic root is normal in size and structure. Venous: The inferior vena cava is normal in size with greater than 50% respiratory variability, suggesting right atrial pressure of 3 mmHg. IAS/Shunts: No atrial level shunt detected by color flow Doppler.  LEFT VENTRICLE PLAX 2D LVIDd:         4.00 cm   Diastology LVIDs:         2.80 cm   LV e' lateral:   7.94 cm/s LV PW:         1.40 cm   LV E/e' lateral: 7.7 LV IVS:        1.30 cm LVOT diam:     2.20 cm LV SV:         70 LV SV Index:   31 LVOT Area:     3.80 cm  RIGHT VENTRICLE RV S prime:     17.40 cm/s TAPSE (M-mode): 2.7 cm LEFT ATRIUM             Index        RIGHT ATRIUM           Index LA diam:        3.60 cm 1.58 cm/m   RA Area:     23.50 cm LA Vol (A2C):   74.1 ml 32.51 ml/m  RA Volume:   65.70 ml  28.83 ml/m LA Vol (A4C):   78.9 ml 34.62 ml/m LA Biplane Vol: 78.6 ml 34.49 ml/m  AORTIC VALVE AV Area (Vmax):    2.78 cm AV Area (Vmean):   2.62 cm AV Area (VTI):     2.77 cm  AV Vmax:           152.00 cm/s AV Vmean:          100.000 cm/s AV VTI:            0.251 m AV Peak Grad:      9.2 mmHg AV Mean Grad:      5.0 mmHg LVOT Vmax:         111.00 cm/s LVOT Vmean:        69.000 cm/s LVOT VTI:          0.183 m LVOT/AV VTI ratio: 0.73  AORTA Ao Root diam: 3.60 cm Ao Asc diam:  3.60 cm MITRAL VALVE MV Area (PHT): 3.74 cm    SHUNTS MV Decel Time: 203 msec    Systemic VTI:  0.18 m MV E velocity: 61.30 cm/s  Systemic Diam: 2.20 cm MV A velocity: 89.10 cm/s MV E/A  ratio:  0.69 Donato Schultz MD Electronically signed by Donato Schultz MD Signature Date/Time: 02/11/2023/11:39:11 AM    Final    DG Chest Portable 1 View  Result Date: 02/11/2023 CLINICAL DATA:  Central chest pain beginning 0130 hours EXAM: PORTABLE CHEST 1 VIEW COMPARISON:  04/04/2022 FINDINGS: Artifact overlies the chest. Heart size upper limits of normal. Chronic aortic atherosclerotic calcification. Upper lungs are clear. No pulmonary edema. Lung bases are poorly seen due portable nature of the film and patient positioning. Consider two-view chest radiography if concern persists regarding the potential for basilar lung disease. No regional bone abnormality. IMPRESSION: No active disease suspected. Lung bases poorly seen due to portable nature of the film and patient positioning. Consider two-view chest radiography if concern persists. Electronically Signed   By: Paulina Fusi M.D.   On: 02/11/2023 07:59    Cardiac Studies   Echocardiogram 02/11/2023 1. Left ventricular ejection fraction, by estimation, is 55 to 60%. The  left ventricle has normal function. The left ventricle demonstrates  regional wall motion abnormalities (see scoring diagram/findings for  description). There is mild left ventricular   hypertrophy. Left ventricular diastolic parameters are consistent with  Grade I diastolic dysfunction (impaired relaxation).   2. Right ventricular systolic function is normal. The right ventricular  size is normal.   3.  Left atrial size was mildly dilated.   4. Right atrial size was mildly dilated.   5. The mitral valve is normal in structure. No evidence of mitral valve  regurgitation. No evidence of mitral stenosis.   6. The aortic valve is normal in structure. Aortic valve regurgitation is  not visualized. No aortic stenosis is present.   7. The inferior vena cava is normal in size with greater than 50%  respiratory variability, suggesting right atrial pressure of 3 mmHg.   Patient Profile     67 y.o. male CKD, hyperlipidemia, hypertension, hypothyroidism, GERD who has been admitted for NSTEMI.  Assessment & Plan    NSTEMI Plans for cardiac catheterization today.  Patient continues to have intermittent chest pain that is relieved by morphine and nitroglycerin.  We discussed about possible plans for DAPT therapy if there is a culprit lesion that requirs stenting.  Patient agreeable to plan and ready to proceed.  Currently being transferred to [facility].  Troponins have increased from 33-10,000+ Echocardiogram LVEF 55 to 60% with regional wall motion abnormalities (apical anterior segment akinesis).  Mild dilatation of the left and right atria. Patient has received his aspirin dose today A1c 5.7%  Hypertension PTA carvedilol 12.5mg  BID (states he takes once a day), losartan-hydrochlorothiazide (HYZAAR) 100-25 MG tablet (hold now prior to cath, can resume after), amlodipine 5mg .   CKD IIIa Creatinine 1.57, GFR 48 slightly improved from yesterday  Hyperlipidemia LDL 135.  Will start atorvastatin 20 mg daily.  Addendum: Patient's catheterization results showed mild nonobstructive coronary artery disease.  He had 20% stenosis of the proximal and mid RCA.  Elevated LVEDP of 28 mmHg.  Will give one-time dose of IV Lasix 40 mg.  Given significant elevation of troponins and atypical presentation, questioning if due to other underlying causes.  Will order ESR, CRP for possible myopericarditis.   Echocardiogram did not note any pericardial effusions.  Will also order a D-dimer to rule out PE. For questions or updates, please contact [facility] Please consult [email or web address] for contact info under    Signed, Abagail Kitchens, PA-C  02/12/2023, 7:49 AM     Patient seen and examined,  note reviewed with the signed Advanced Practice Provider. I personally reviewed laboratory data, imaging studies and relevant notes. I independently examined the patient and formulated the important aspects of the plan. I have personally discussed the plan with the patient and/or family. Comments or changes to the note/plan are indicated below.   Heart catheterization does not show any evidence of significant coronary artery disease.  Significant burden of troponin.  Cannot rule out myopericarditis.  Will get lab work which pending ESR, CRP.  If negative will plan for cardiac MRI.  If positive we will go ahead and start treating the patient with colchicine.  Restart his losartan 25 mg daily.  Will continue to follow with you.  Thomasene Ripple DO, MS [facility] Attending Cardiologist [facility]  [address] [place], [place] [address] [phone] Website: [email or web address]

## 2023-02-13 ENCOUNTER — Encounter: Payer: Self-pay | Admitting: Internal Medicine

## 2023-02-13 ENCOUNTER — Telehealth: Payer: Self-pay | Admitting: Physician Assistant

## 2023-02-13 ENCOUNTER — Other Ambulatory Visit (HOSPITAL_COMMUNITY): Payer: Self-pay

## 2023-02-13 ENCOUNTER — Encounter (HOSPITAL_COMMUNITY): Payer: Self-pay | Admitting: Cardiology

## 2023-02-13 DIAGNOSIS — I161 Hypertensive emergency: Secondary | ICD-10-CM | POA: Diagnosis not present

## 2023-02-13 DIAGNOSIS — I214 Non-ST elevation (NSTEMI) myocardial infarction: Secondary | ICD-10-CM | POA: Diagnosis not present

## 2023-02-13 DIAGNOSIS — I10 Essential (primary) hypertension: Secondary | ICD-10-CM

## 2023-02-13 DIAGNOSIS — N179 Acute kidney failure, unspecified: Secondary | ICD-10-CM | POA: Diagnosis not present

## 2023-02-13 DIAGNOSIS — F418 Other specified anxiety disorders: Secondary | ICD-10-CM | POA: Diagnosis not present

## 2023-02-13 LAB — CBC
HCT: 46.4 % (ref 39.0–52.0)
Hemoglobin: 15.2 g/dL (ref 13.0–17.0)
MCH: 33.2 pg (ref 26.0–34.0)
MCHC: 32.8 g/dL (ref 30.0–36.0)
MCV: 101.3 fL — ABNORMAL HIGH (ref 80.0–100.0)
Platelets: 235 10*3/uL (ref 150–400)
RBC: 4.58 MIL/uL (ref 4.22–5.81)
RDW: 14.6 % (ref 11.5–15.5)
WBC: 9.1 10*3/uL (ref 4.0–10.5)
nRBC: 0 % (ref 0.0–0.2)

## 2023-02-13 LAB — BASIC METABOLIC PANEL
Anion gap: 13 (ref 5–15)
BUN: 16 mg/dL (ref 8–23)
CO2: 24 mmol/L (ref 22–32)
Calcium: 8.6 mg/dL — ABNORMAL LOW (ref 8.9–10.3)
Chloride: 100 mmol/L (ref 98–111)
Creatinine, Ser: 1.37 mg/dL — ABNORMAL HIGH (ref 0.61–1.24)
GFR, Estimated: 57 mL/min — ABNORMAL LOW (ref >60)
Glucose, Bld: 95 mg/dL (ref 70–99)
Potassium: 3.5 mmol/L (ref 3.5–5.1)
Sodium: 137 mmol/L (ref 135–145)

## 2023-02-13 LAB — MAGNESIUM: Magnesium: 1.8 mg/dL (ref 1.7–2.4)

## 2023-02-13 MED ORDER — CLOPIDOGREL BISULFATE 75 MG PO TABS
75.0000 mg | ORAL_TABLET | Freq: Every day | ORAL | 3 refills | Status: DC
  Filled 2023-02-13: qty 30, 30d supply, fill #0

## 2023-02-13 MED ORDER — MAGNESIUM OXIDE -MG SUPPLEMENT 400 (240 MG) MG PO TABS
800.0000 mg | ORAL_TABLET | Freq: Once | ORAL | Status: AC
  Administered 2023-02-13: 800 mg via ORAL
  Filled 2023-02-13: qty 2

## 2023-02-13 MED ORDER — COLCHICINE 0.6 MG PO TABS
0.6000 mg | ORAL_TABLET | Freq: Every day | ORAL | Status: DC
  Administered 2023-02-13: 0.6 mg via ORAL
  Filled 2023-02-13: qty 1

## 2023-02-13 MED ORDER — COLCHICINE 0.6 MG PO TABS
0.6000 mg | ORAL_TABLET | Freq: Every day | ORAL | 1 refills | Status: DC
  Filled 2023-02-13: qty 30, 30d supply, fill #0

## 2023-02-13 MED ORDER — ATORVASTATIN CALCIUM 20 MG PO TABS
20.0000 mg | ORAL_TABLET | Freq: Every day | ORAL | 3 refills | Status: DC
  Filled 2023-02-13: qty 30, 30d supply, fill #0

## 2023-02-13 MED ORDER — CARVEDILOL 25 MG PO TABS
25.0000 mg | ORAL_TABLET | Freq: Two times a day (BID) | ORAL | 3 refills | Status: DC
  Filled 2023-02-13: qty 60, 30d supply, fill #0

## 2023-02-13 MED ORDER — POTASSIUM CHLORIDE CRYS ER 20 MEQ PO TBCR
40.0000 meq | EXTENDED_RELEASE_TABLET | Freq: Once | ORAL | Status: AC
  Administered 2023-02-13: 40 meq via ORAL
  Filled 2023-02-13: qty 2

## 2023-02-13 MED ORDER — CLOPIDOGREL BISULFATE 75 MG PO TABS
300.0000 mg | ORAL_TABLET | Freq: Once | ORAL | Status: AC
  Administered 2023-02-13: 300 mg via ORAL
  Filled 2023-02-13: qty 4

## 2023-02-13 MED ORDER — CLOPIDOGREL BISULFATE 75 MG PO TABS: 75.0000 mg | ORAL_TABLET | Freq: Every day | ORAL | Status: DC

## 2023-02-13 MED ORDER — ASPIRIN 81 MG PO CHEW: 81.0000 mg | CHEWABLE_TABLET | Freq: Every day | ORAL | Status: DC

## 2023-02-13 MED ORDER — LOSARTAN POTASSIUM 100 MG PO TABS
100.0000 mg | ORAL_TABLET | Freq: Every day | ORAL | 3 refills | Status: DC
  Filled 2023-02-13: qty 30, 30d supply, fill #0

## 2023-02-13 NOTE — Discharge Summary (Signed)
Physician Discharge Summary   Richard Sellers ZOX:[id number] DOB: 01-30-1956 DOA: 02/11/2023  PCP: Nelwyn Salisbury, MD  Admit date: 02/11/2023 Discharge date: 02/13/2023  Admitted From: Home Disposition:  Home Discharging physician: Lewie Chamber, MD Barriers to discharge: none  Recommendations at discharge: Follow-up with cardiology Follow-up blood pressure log and adjust antihypertensives. Repeat BMP to see if creat has normalized vs underlying undiagnosed CKD  Discharge Condition: stable CODE STATUS: Full Diet recommendation:  Diet Orders (From admission, onward)     Start     Ordered   02/13/23 0000  Diet - low sodium heart healthy        02/13/23 1348   02/12/23 1348  Diet Heart Room service appropriate? Yes; Fluid consistency: Thin  Diet effective now       Question Answer Comment  Room service appropriate? Yes   Fluid consistency: Thin      02/12/23 1347            Hospital Course: Richard Sellers is a 67 yo male with PMH HTN, hypothyroidism, GERD, medication noncompliance who presents to the hospital with abdominal pain, chest pain, nausea.  He has been having nausea and right upper quadrant abdominal discomfort for approximately 2 to 3 weeks.  Right upper quadrant ultrasound was unremarkable when previously performed. On workup on admission he had rapidly uptrending troponin values.  He was started on aspirin, heparin, and nitroglycerin ointment.  Cardiology was consulted. He underwent left heart cath on 02/12/2023 which showed mild nonobstructive CAD. He was started on aspirin and Plavix prior to discharge. He underwent further workup regarding elevated troponins.  Due to elevated CRP, he was started on colchicine per cardiology as well. Blood pressure regimen was modified and he is continued on Coreg 25 mg twice daily, losartan 100 mg daily.  He was recommended to keep a blood pressure log at discharge and if blood pressures uptrend, he may need to be resumed on  amlodipine as well.  Assessment and Plan: * NSTEMI (non-ST elevated myocardial infarction) (HCC) - presented with CP and uptrending troponins (have not peaked yet; last 10,074) - cardiology followed - s/p LHC on 5/23, showed mild non-obs CAD and moderately elevated LVEDP - trop leak may be due to uncontrolled BP vs myocarditis of other etiology  - CRP elevated and patient started on colchicine per cardiology as well - asa and plavix continued also at discharge  Hypertensive emergency-resolved as of 02/13/2023 - Patient endorses not taking his home regimen as prescribed.  He has been prescribed amlodipine, Coreg, losartan-HCTZ but does not take these routinely - see HTN  AKI (acute kidney injury) (HCC) - No baseline for comparison - Creatinine 1.57 on admission.  At risk for CKD given uncontrolled hypertension -Creatinine down to 1.37 at discharge  Essential hypertension - Blood pressure improved after resuming antihypertensives - Regimen at discharge includes Coreg 25 mg twice daily and losartan 100 mg daily -Patient instructed if blood pressure still above goal at discharge, he may need to be resumed back on amlodipine.  He has been told to try and keep a blood pressure journal and bring to his cardiology follow-up  Depression with anxiety - Continue home Xanax.  Verified on database  Hypothyroidism - No Synthroid noted on home med rec -TSH checked and is normal, 2.312  GERD - Asymptomatic at this time   The patient's chronic medical conditions were treated accordingly per the patient's home medication regimen except as noted.  On day of discharge, patient was felt  deemed stable for discharge. Patient/family member advised to call PCP or come back to ER if needed.   Principal Diagnosis: NSTEMI (non-ST elevated myocardial infarction) [facility])  Discharge Diagnoses: Active Hospital Problems   Diagnosis Date Noted   NSTEMI (non-ST elevated myocardial infarction) (HCC) 02/11/2023     Priority: 1.   AKI (acute kidney injury) (HCC) 02/11/2023    Priority: 3.   Essential hypertension 09/25/2008    Priority: 4.   Depression with anxiety 11/28/2020   Hypothyroidism 11/26/2016   GERD 05/30/2010    Resolved Hospital Problems   Diagnosis Date Noted Date Resolved   Hypertensive emergency 02/12/2023 02/13/2023    Priority: 2.     Discharge Instructions     AMB referral to Phase II Cardiac Rehabilitation   Complete by: As directed    Diagnosis: NSTEMI   After initial evaluation and assessments completed: Virtual Based Care may be provided alone or in conjunction with Phase 2 Cardiac Rehab based on patient barriers.: Yes   Intensive Cardiac Rehabilitation (ICR) [facility] location only OR Traditional Cardiac Rehabilitation (TCR) *If criteria for ICR are not met will enroll in TCR [facility] only): Yes   Diet - low sodium heart healthy   Complete by: As directed    Increase activity slowly   Complete by: As directed       Allergies as of 02/13/2023   No Known Allergies      Medication List     STOP taking these medications    albuterol 108 (90 Base) MCG/ACT inhaler Commonly known as: VENTOLIN HFA   amLODipine 5 MG tablet Commonly known as: NORVASC   dextromethorphan-guaiFENesin 30-600 MG 12hr tablet Commonly known as: MUCINEX DM   diclofenac 75 MG EC tablet Commonly known as: VOLTAREN   fluticasone 50 MCG/ACT nasal spray Commonly known as: FLONASE   gabapentin 300 MG capsule Commonly known as: NEURONTIN   halobetasol 0.05 % cream Commonly known as: ULTRAVATE   losartan-hydrochlorothiazide 100-25 MG tablet Commonly known as: HYZAAR   omeprazole 40 MG capsule Commonly known as: PRILOSEC   SYRINGE-NEEDLE (DISP) 3 ML 20G X 1-1/2" 3 ML Misc Commonly known as: Safety Syringes/Needle   tadalafil 20 MG tablet Commonly known as: CIALIS   temazepam 15 MG capsule Commonly known as: RESTORIL   Vitamin D 125 MCG (5000 UT) Caps       TAKE these  medications    ALPRAZolam 1 MG tablet Commonly known as: XANAX Take 1 tablet (1 mg total) by mouth every 6 (six) hours as needed for anxiety. for anxiety   aspirin 81 MG chewable tablet Chew 1 tablet (81 mg total) by mouth daily. Start taking on: Feb 14, 2023   atorvastatin 20 MG tablet Commonly known as: LIPITOR Take 1 tablet (20 mg total) by mouth daily. Start taking on: Feb 14, 2023   carvedilol 25 MG tablet Commonly known as: COREG Take 1 tablet (25 mg total) by mouth 2 (two) times daily with a meal. What changed:  medication strength See the new instructions.   clopidogrel 75 MG tablet Commonly known as: PLAVIX Take 1 tablet (75 mg total) by mouth daily. Start taking on: Feb 14, 2023   colchicine 0.6 MG tablet Take 1 tablet (0.6 mg total) by mouth daily. Start taking on: Feb 14, 2023   losartan 100 MG tablet Commonly known as: COZAAR Take 1 tablet (100 mg total) by mouth daily. Start taking on: Feb 14, 2023   montelukast 10 MG tablet Commonly known as: SINGULAIR TAKE  1 TABLET BY MOUTH AT BEDTIME   ondansetron 4 MG tablet Commonly known as: Zofran Take 1 tablet (4 mg total) by mouth every 8 (eight) hours as needed for nausea or vomiting.   testosterone cypionate 200 MG/ML injection Commonly known as: DEPOTESTOSTERONE CYPIONATE INJECT 1 ML INTRAMUSCULARLY EVERY 7 DAYS What changed:  how much to take how to take this when to take this additional instructions   triamcinolone cream 0.1 % Commonly known as: KENALOG Apply 1 Application topically 2 (two) times daily as needed.        Follow-up Information     Monge, Petra Kuba, NP Follow up.   Specialties: Cardiology, Family Medicine Why: [facility] - [facility] location - cardiology follow-up arranged Thursday February 26, 2023 at 1:55 PM (Arrive by 1:40 PM). Irving Burton is one of our nurse practitioners that works with Dr. Servando Salina. Contact information: [address] [place]  [address] [phone]                No Known Allergies  Consultations: Cardiology  Procedures: 02/12/2023: LHC  Discharge Exam: BP (!) 83/58   Pulse 83   Temp 98.3 F (36.8 C) (Oral)   Resp 12   Ht 5\' 7"  (1.702 m)   Wt 127.4 kg   SpO2 97%   BMI 43.99 kg/m  Physical Exam Constitutional:      General: He is not in acute distress.    Appearance: Normal appearance. He is obese.  HENT:     Head: Normocephalic and atraumatic.     Mouth/Throat:     Mouth: Mucous membranes are moist.  Eyes:     Extraocular Movements: Extraocular movements intact.  Cardiovascular:     Rate and Rhythm: Normal rate and regular rhythm.  Pulmonary:     Effort: Pulmonary effort is normal. No respiratory distress.     Breath sounds: Normal breath sounds. No wheezing.  Abdominal:     General: Bowel sounds are normal. There is no distension.     Palpations: Abdomen is soft.     Tenderness: There is no abdominal tenderness.  Musculoskeletal:        General: Normal range of motion.     Cervical back: Normal range of motion and neck supple.  Skin:    General: Skin is warm and dry.  Neurological:     General: No focal deficit present.     Mental Status: He is alert.  Psychiatric:        Mood and Affect: Mood normal.        Behavior: Behavior normal.      The results of significant diagnostics from this hospitalization (including imaging, microbiology, ancillary and laboratory) are listed below for reference.   Microbiology: Recent Results (from the past 240 hour(s))  MRSA Next Gen by PCR, Nasal     Status: None   Collection Time: 02/11/23  3:26 PM   Specimen: Nasal Mucosa; Nasal Swab  Result Value Ref Range Status   MRSA by PCR Next Gen NOT DETECTED NOT DETECTED Final    Comment: (NOTE) The GeneXpert MRSA Assay (FDA approved for NASAL specimens only), is one component of a comprehensive MRSA colonization surveillance program. It is not intended to diagnose MRSA infection nor to  guide or monitor treatment for MRSA infections. Test performance is not FDA approved in patients less than 67 years old. Performed at [facility], [address], [place], [place] [address]      Labs: BNP (last 3  results) No results for input(s): "BNP" in the last 8760 hours. Basic Metabolic Panel: Recent Labs  Lab 02/11/23 0710 02/11/23 0727 02/12/23 0310 02/12/23 1400 02/13/23 0302  NA 135 137 136  --  137  K 4.3 4.3 3.8  --  3.5  CL 101 100 101  --  100  CO2 26  --  26  --  24  GLUCOSE 123* 118* 107*  --  95  BUN 12 10 13   --  16  CREATININE 1.72* 1.70* 1.57* 1.42* 1.37*  CALCIUM 9.2  --  8.4*  --  8.6*  MG 1.7  --   --   --  1.8   Liver Function Tests: Recent Labs  Lab 02/11/23 0710  AST 27  ALT 24  ALKPHOS 65  BILITOT 1.1  PROT 7.7  ALBUMIN 3.8   Recent Labs  Lab 02/11/23 0710  LIPASE 39   No results for input(s): "AMMONIA" in the last 168 hours. CBC: Recent Labs  Lab 02/11/23 0710 02/11/23 0727 02/12/23 0310 02/12/23 1400 02/13/23 0302  WBC 9.6  --  8.7 7.8 9.1  NEUTROABS 6.9  --   --   --   --   HGB 16.4 17.0 14.6 16.8 15.2  HCT 48.6 50.0 44.6 52.4* 46.4  MCV 100.2*  --  100.7* 101.0* 101.3*  PLT 276  --  221 252 235   Cardiac Enzymes: No results for input(s): "CKTOTAL", "CKMB", "CKMBINDEX", "TROPONINI" in the last 168 hours. BNP: Invalid input(s): "POCBNP" CBG: Recent Labs  Lab 02/11/23 0720  GLUCAP 117*   D-Dimer Recent Labs    02/12/23 1400  DDIMER 0.53*   Hgb A1c Recent Labs    02/11/23 0710  HGBA1C 5.2   Lipid Profile Recent Labs    02/11/23 0710  CHOL 186  HDL 34*  LDLCALC 135*  TRIG 87  CHOLHDL 5.5   Thyroid function studies Recent Labs    02/12/23 1530  TSH 2.312   Anemia work up No results for input(s): "VITAMINB12", "FOLATE", "FERRITIN", "TIBC", "IRON", "RETICCTPCT" in the last 72 hours. Urinalysis    Component Value Date/Time   COLORURINE YELLOW 10/26/2017 1521   APPEARANCEUR  CLEAR 10/26/2017 1521   LABSPEC 1.019 10/26/2017 1521   PHURINE 5.0 10/26/2017 1521   GLUCOSEU NEGATIVE 10/26/2017 1521   HGBUR NEGATIVE 10/26/2017 1521   HGBUR negative 09/03/2007 1110   BILIRUBINUR negative 04/26/2018 1459   KETONESUR NEGATIVE 10/26/2017 1521   PROTEINUR Negative 04/26/2018 1459   PROTEINUR NEGATIVE 10/26/2017 1521   UROBILINOGEN 0.2 04/26/2018 1459   UROBILINOGEN 0.2 01/01/2010 0524   NITRITE negative 04/26/2018 1459   NITRITE NEGATIVE 10/26/2017 1521   LEUKOCYTESUR Negative 04/26/2018 1459   Sepsis Labs Recent Labs  Lab 02/11/23 0710 02/12/23 0310 02/12/23 1400 02/13/23 0302  WBC 9.6 8.7 7.8 9.1   Microbiology Recent Results (from the past 240 hour(s))  MRSA Next Gen by PCR, Nasal     Status: None   Collection Time: 02/11/23  3:26 PM   Specimen: Nasal Mucosa; Nasal Swab  Result Value Ref Range Status   MRSA by PCR Next Gen NOT DETECTED NOT DETECTED Final    Comment: (NOTE) The GeneXpert MRSA Assay (FDA approved for NASAL specimens only), is one component of a comprehensive MRSA colonization surveillance program. It is not intended to diagnose MRSA infection nor to guide or monitor treatment for MRSA infections. Test performance is not FDA approved in patients less than 66 years old. Performed at [facility],  [address], [place], [place] [address]     Procedures/Studies: CARDIAC CATHETERIZATION  Result Date: 02/13/2023   Prox RCA to Mid RCA lesion is 20% stenosed.   LV end diastolic pressure is moderately elevated. Mild nonobstructive CAD Moderately elevated LVEDP 28 mm Hg Plan: patient has MINOCA possibly related to severe uncontrolled HTN. LVEDP is elevated. Will manage medically.   ECHOCARDIOGRAM COMPLETE  Result Date: 02/11/2023    ECHOCARDIOGRAM REPORT   Patient Name:   Richard Sellers Date of Exam: 02/11/2023 Medical Rec #:  [id number]        Height:       67.0 in Accession #:    [id number]       Weight:       265.0 lb  Date of Birth:  [birth year]       BSA:          2.279 m Patient Age:    67 years         BP:           158/99 mmHg Patient Gender: M                HR:           91 bpm. Exam Location:  Inpatient Procedure: 2D Echo, Color Doppler, Cardiac Doppler and Intracardiac            Opacification Agent STAT ECHO Indications:    Chest Pain  History:        Patient has prior history of Echocardiogram examinations, most                 recent 08/27/2020. Risk Factors:Hypertension.  Sonographer:    Milbert Coulter Referring Phys: [id number] SHENG L HALEY  Sonographer Comments: Technically difficult study due to poor echo windows, suboptimal apical window, suboptimal parasternal window and patient is obese. Image acquisition challenging due to patient body habitus. IMPRESSIONS  1. Left ventricular ejection fraction, by estimation, is 55 to 60%. The left ventricle has normal function. The left ventricle demonstrates regional wall motion abnormalities (see scoring diagram/findings for description). There is mild left ventricular  hypertrophy. Left ventricular diastolic parameters are consistent with Grade I diastolic dysfunction (impaired relaxation).  2. Right ventricular systolic function is normal. The right ventricular size is normal.  3. Left atrial size was mildly dilated.  4. Right atrial size was mildly dilated.  5. The mitral valve is normal in structure. No evidence of mitral valve regurgitation. No evidence of mitral stenosis.  6. The aortic valve is normal in structure. Aortic valve regurgitation is not visualized. No aortic stenosis is present.  7. The inferior vena cava is normal in size with greater than 50% respiratory variability, suggesting right atrial pressure of 3 mmHg. FINDINGS  Left Ventricle: Left ventricular ejection fraction, by estimation, is 55 to 60%. The left ventricle has normal function. The left ventricle demonstrates regional wall motion abnormalities. Definity contrast agent was given IV to delineate the  left ventricular endocardial borders. The left ventricular internal cavity size was normal in size. There is mild left ventricular hypertrophy. Left ventricular diastolic parameters are consistent with Grade I diastolic dysfunction (impaired relaxation).  LV Wall Scoring: The apical anterior segment is akinetic. Right Ventricle: The right ventricular size is normal. No increase in right ventricular wall thickness. Right ventricular systolic function is normal. Left Atrium: Left atrial size was mildly dilated. Right Atrium: Right atrial size was mildly dilated. Pericardium: There is no evidence of pericardial effusion. Mitral Valve:  The mitral valve is normal in structure. No evidence of mitral valve regurgitation. No evidence of mitral valve stenosis. Tricuspid Valve: The tricuspid valve is normal in structure. Tricuspid valve regurgitation is not demonstrated. No evidence of tricuspid stenosis. Aortic Valve: The aortic valve is normal in structure. Aortic valve regurgitation is not visualized. No aortic stenosis is present. Aortic valve mean gradient measures 5.0 mmHg. Aortic valve peak gradient measures 9.2 mmHg. Aortic valve area, by VTI measures 2.77 cm. Pulmonic Valve: The pulmonic valve was normal in structure. Pulmonic valve regurgitation is not visualized. No evidence of pulmonic stenosis. Aorta: The aortic root is normal in size and structure. Venous: The inferior vena cava is normal in size with greater than 50% respiratory variability, suggesting right atrial pressure of 3 mmHg. IAS/Shunts: No atrial level shunt detected by color flow Doppler.  LEFT VENTRICLE PLAX 2D LVIDd:         4.00 cm   Diastology LVIDs:         2.80 cm   LV e' lateral:   7.94 cm/s LV PW:         1.40 cm   LV E/e' lateral: 7.7 LV IVS:        1.30 cm LVOT diam:     2.20 cm LV SV:         70 LV SV Index:   31 LVOT Area:     3.80 cm  RIGHT VENTRICLE RV S prime:     17.40 cm/s TAPSE (M-mode): 2.7 cm LEFT ATRIUM             Index         RIGHT ATRIUM           Index LA diam:        3.60 cm 1.58 cm/m   RA Area:     23.50 cm LA Vol (A2C):   74.1 ml 32.51 ml/m  RA Volume:   65.70 ml  28.83 ml/m LA Vol (A4C):   78.9 ml 34.62 ml/m LA Biplane Vol: 78.6 ml 34.49 ml/m  AORTIC VALVE AV Area (Vmax):    2.78 cm AV Area (Vmean):   2.62 cm AV Area (VTI):     2.77 cm AV Vmax:           152.00 cm/s AV Vmean:          100.000 cm/s AV VTI:            0.251 m AV Peak Grad:      9.2 mmHg AV Mean Grad:      5.0 mmHg LVOT Vmax:         111.00 cm/s LVOT Vmean:        69.000 cm/s LVOT VTI:          0.183 m LVOT/AV VTI ratio: 0.73  AORTA Ao Root diam: 3.60 cm Ao Asc diam:  3.60 cm MITRAL VALVE MV Area (PHT): 3.74 cm    SHUNTS MV Decel Time: 203 msec    Systemic VTI:  0.18 m MV E velocity: 61.30 cm/s  Systemic Diam: 2.20 cm MV A velocity: 89.10 cm/s MV E/A ratio:  0.69 Donato Schultz MD Electronically signed by Donato Schultz MD Signature Date/Time: 02/11/2023/11:39:11 AM    Final    DG Chest Portable 1 View  Result Date: 02/11/2023 CLINICAL DATA:  Central chest pain beginning 0130 hours EXAM: PORTABLE CHEST 1 VIEW COMPARISON:  04/04/2022 FINDINGS: Artifact overlies the chest. Heart size upper limits of normal. Chronic aortic atherosclerotic  calcification. Upper lungs are clear. No pulmonary edema. Lung bases are poorly seen due portable nature of the film and patient positioning. Consider two-view chest radiography if concern persists regarding the potential for basilar lung disease. No regional bone abnormality. IMPRESSION: No active disease suspected. Lung bases poorly seen due to portable nature of the film and patient positioning. Consider two-view chest radiography if concern persists. Electronically Signed   By: Paulina Fusi M.D.   On: 02/11/2023 07:59   US Abdomen Limited RUQ (LIVER/GB)  Result Date: 01/26/2023 CLINICAL DATA:  Chronic nausea EXAM: ULTRASOUND ABDOMEN LIMITED RIGHT UPPER QUADRANT COMPARISON:  CT renal stone 10/26/2017 FINDINGS: Gallbladder:  No gallstones or wall thickening visualized. No sonographic Murphy sign noted by sonographer. Common bile duct: Diameter: 3 mm Liver: Increased echogenicity. No focal lesion. Portal vein is patent on color Doppler imaging with normal direction of blood flow towards the liver. Other: None. IMPRESSION: 1. Increased hepatic parenchymal echogenicity suggestive of steatosis. 2. No cholelithiasis or sonographic evidence for acute cholecystitis. Electronically Signed   By: Annia Belt M.D.   On: 01/26/2023 19:59     Time coordinating discharge: Over 30 minutes    Lewie Chamber, MD  [facility] 02/13/2023, 2:27 PM

## 2023-02-13 NOTE — Telephone Encounter (Signed)
   Transition of Care Follow-up Phone Call Request    Patient Name: Richard Sellers Date of Birth: 1956/07/05 Date of Encounter: 02/13/2023  Primary Care Provider:  Nelwyn Salisbury, MD Primary Cardiologist:  Thomasene Ripple, DO  Richard Sellers has been scheduled for a transition of care follow up appointment with a [facility] provider: 6/6 with Bernadene Person, NP    Please reach out to Richard Sellers within 48 hours to confirm appointment and review transition of care protocol questionnaire.  Laurann Montana, PA-C  02/13/2023, 11:20 AM

## 2023-02-13 NOTE — Discharge Instructions (Signed)
No lifting more than 5-10 pounds until cleared by cardiology.  Go to cardiology appointment on 02/26/23  Check blood pressure daily. If greater than 130/90 then resume taking your amlodipine

## 2023-02-13 NOTE — Progress Notes (Addendum)
Progress Note  Patient Name: Richard Sellers Date of Encounter: 02/13/2023  Primary Cardiologist: Thomasene Ripple, DO  Subjective   Feeling good. No recurrent CP. No SOB, edema, orthopnea. Reports good UOP with IV Lasix. 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 We discussed that you are usually out for a week but will need to review final rec with MD.  Inpatient Medications    Scheduled Meds:  amLODipine  10 mg Oral Daily   aspirin  81 mg Oral Daily   atorvastatin  20 mg Oral Daily   carvedilol  25 mg Oral BID WC   Chlorhexidine Gluconate Cloth  6 each Topical Daily   heparin  5,000 Units Subcutaneous Q8H   losartan  100 mg Oral Daily   magnesium oxide  800 mg Oral Once   pneumococcal 20-valent conjugate vaccine  0.5 mL Intramuscular Tomorrow-1000   potassium chloride  40 mEq Oral Once   sodium chloride flush  3 mL Intravenous Q12H   Continuous Infusions:  sodium chloride     PRN Meds: sodium chloride, acetaminophen **OR** acetaminophen, albuterol, ALPRAZolam, hydrALAZINE, labetalol, ondansetron **OR** ondansetron (ZOFRAN) IV, mouth rinse, mouth rinse, senna-docusate, sodium chloride flush, traZODone   Vital Signs    Vitals:   02/12/23 2000 02/12/23 2125 02/13/23 0009 02/13/23 0400  BP:  138/78 (!) 132/97   Pulse:  86 95   Resp:  (!) 22 20   Temp: 98.4 F (36.9 C)  98.5 F (36.9 C) 98.5 F (36.9 C)  TempSrc: Oral  Oral Oral  SpO2:      Weight:      Height:        Intake/Output Summary (Last 24 hours) at 02/13/2023 0740 Last data filed at 02/12/2023 2200 Gross per 24 hour  Intake 628.33 ml  Output 1600 ml  Net -971.67 ml      02/12/2023    1:58 AM 02/11/2023    3:20 PM 02/11/2023    7:05 AM  Last 3 Weights  Weight (lbs) 280 lb 13.9 oz 279 lb 5.2 oz 265 lb  Weight (kg) 127.4 kg 126.7 kg 120.203 kg     Telemetry    NSR - Personally Reviewed  ECG    No new tracings - Personally  Reviewed  Physical Exam   GEN: No acute distress.  HEENT: Normocephalic, atraumatic, sclera non-icteric. Neck: No JVD or bruits. Cardiac: RRR no murmurs, rubs, or gallops.  Respiratory: Clear to auscultation bilaterally. Breathing is unlabored. GI: Soft, nontender, non-distended, BS +x 4. MS: no deformity. Extremities: No clubbing or cyanosis. No edema. Distal pedal pulses are 2+ and equal bilaterally. Right radial cath site without hematoma or ecchymosis; good pulse. Has small firm mass at base of right wrist which he reports is a chronic Ganglion cyst without acute change Neuro:  AAOx3. Follows commands. Psych:  Responds to questions appropriately with a normal affect.  Labs    High Sensitivity Troponin:   Recent Labs  Lab 02/11/23 1323 02/11/23 1612 02/11/23 1749 02/11/23 2342 02/12/23 1404  TROPONINIHS 1,923* 3,505* 5,928* 10,074* 7,602*      Cardiac EnzymesNo results for input(s): "TROPONINI" in the last 168 hours. No results for input(s): "TROPIPOC" in the last 168 hours.   Chemistry Recent Labs  Lab 02/11/23 0710 02/11/23 0727 02/12/23 0310 02/12/23 1400 02/13/23 0302  NA 135 137 136  --  137  K 4.3 4.3 3.8  --  3.5  CL 101 100 101  --  100  CO2 26  --  26  --  24  GLUCOSE 123* 118* 107*  --  95  BUN 12 10 13   --  16  CREATININE 1.72* 1.70* 1.57* 1.42* 1.37*  CALCIUM 9.2  --  8.4*  --  8.6*  PROT 7.7  --   --   --   --   ALBUMIN 3.8  --   --   --   --   AST 27  --   --   --   --   ALT 24  --   --   --   --   ALKPHOS 65  --   --   --   --   BILITOT 1.1  --   --   --   --   GFRNONAA 43*  --  48* 54* 57*  ANIONGAP 8  --  9  --  13     Hematology Recent Labs  Lab 02/12/23 0310 02/12/23 1400 02/13/23 0302  WBC 8.7 7.8 9.1  RBC 4.43 5.19 4.58  HGB 14.6 16.8 15.2  HCT 44.6 52.4* 46.4  MCV 100.7* 101.0* 101.3*  MCH 33.0 32.4 33.2  MCHC 32.7 32.1 32.8  RDW 14.5 14.6 14.6  PLT 221 252 235    BNPNo results for input(s): "BNP", "PROBNP" in the last  168 hours.   DDimer  Recent Labs  Lab 02/12/23 1400  DDIMER 0.53*     Radiology    CARDIAC CATHETERIZATION  Result Date: 02/13/2023   Prox RCA to Mid RCA lesion is 20% stenosed.   LV end diastolic pressure is moderately elevated. Mild nonobstructive CAD Moderately elevated LVEDP 28 mm Hg Plan: patient has MINOCA possibly related to severe uncontrolled HTN. LVEDP is elevated. Will manage medically.   ECHOCARDIOGRAM COMPLETE  Result Date: 02/11/2023    ECHOCARDIOGRAM REPORT   Patient Name:   Richard Sellers Date of Exam: 02/11/2023 Medical Rec #:  [id number]        Height:       67.0 in Accession #:    [id number]       Weight:       265.0 lb Date of Birth:  July 29, 1956       BSA:          2.279 m Patient Age:    66 years         BP:           158/99 mmHg Patient Gender: M                HR:           91 bpm. Exam Location:  Inpatient Procedure: 2D Echo, Color Doppler, Cardiac Doppler and Intracardiac            Opacification Agent STAT ECHO Indications:    Chest Pain  History:        Patient has prior history of Echocardiogram examinations, most                 recent 08/27/2020. Risk Factors:Hypertension.  Sonographer:    Milbert Coulter Referring Phys: [id number] SHENG L HALEY  Sonographer Comments: Technically difficult study due to poor echo windows, suboptimal apical window, suboptimal parasternal window and patient is obese. Image acquisition challenging due to patient body habitus. IMPRESSIONS  1. Left ventricular ejection fraction, by estimation, is 55 to 60%. The left ventricle has normal function. The left ventricle demonstrates  regional wall motion abnormalities (see scoring diagram/findings for description). There is mild left ventricular  hypertrophy. Left ventricular diastolic parameters are consistent with Grade I diastolic dysfunction (impaired relaxation).  2. Right ventricular systolic function is normal. The right ventricular size is normal.  3. Left atrial size was mildly dilated.  4.  Right atrial size was mildly dilated.  5. The mitral valve is normal in structure. No evidence of mitral valve regurgitation. No evidence of mitral stenosis.  6. The aortic valve is normal in structure. Aortic valve regurgitation is not visualized. No aortic stenosis is present.  7. The inferior vena cava is normal in size with greater than 50% respiratory variability, suggesting right atrial pressure of 3 mmHg. FINDINGS  Left Ventricle: Left ventricular ejection fraction, by estimation, is 55 to 60%. The left ventricle has normal function. The left ventricle demonstrates regional wall motion abnormalities. Definity contrast agent was given IV to delineate the left ventricular endocardial borders. The left ventricular internal cavity size was normal in size. There is mild left ventricular hypertrophy. Left ventricular diastolic parameters are consistent with Grade I diastolic dysfunction (impaired relaxation).  LV Wall Scoring: The apical anterior segment is akinetic. Right Ventricle: The right ventricular size is normal. No increase in right ventricular wall thickness. Right ventricular systolic function is normal. Left Atrium: Left atrial size was mildly dilated. Right Atrium: Right atrial size was mildly dilated. Pericardium: There is no evidence of pericardial effusion. Mitral Valve: The mitral valve is normal in structure. No evidence of mitral valve regurgitation. No evidence of mitral valve stenosis. Tricuspid Valve: The tricuspid valve is normal in structure. Tricuspid valve regurgitation is not demonstrated. No evidence of tricuspid stenosis. Aortic Valve: The aortic valve is normal in structure. Aortic valve regurgitation is not visualized. No aortic stenosis is present. Aortic valve mean gradient measures 5.0 mmHg. Aortic valve peak gradient measures 9.2 mmHg. Aortic valve area, by VTI measures 2.77 cm. Pulmonic Valve: The pulmonic valve was normal in structure. Pulmonic valve regurgitation is not  visualized. No evidence of pulmonic stenosis. Aorta: The aortic root is normal in size and structure. Venous: The inferior vena cava is normal in size with greater than 50% respiratory variability, suggesting right atrial pressure of 3 mmHg. IAS/Shunts: No atrial level shunt detected by color flow Doppler.  LEFT VENTRICLE PLAX 2D LVIDd:         4.00 cm   Diastology LVIDs:         2.80 cm   LV e' lateral:   7.94 cm/s LV PW:         1.40 cm   LV E/e' lateral: 7.7 LV IVS:        1.30 cm LVOT diam:     2.20 cm LV SV:         70 LV SV Index:   31 LVOT Area:     3.80 cm  RIGHT VENTRICLE RV S prime:     17.40 cm/s TAPSE (M-mode): 2.7 cm LEFT ATRIUM             Index        RIGHT ATRIUM           Index LA diam:        3.60 cm 1.58 cm/m   RA Area:     23.50 cm LA Vol (A2C):   74.1 ml 32.51 ml/m  RA Volume:   65.70 ml  28.83 ml/m LA Vol (A4C):   78.9 ml 34.62 ml/m LA Biplane Vol:  78.6 ml 34.49 ml/m  AORTIC VALVE AV Area (Vmax):    2.78 cm AV Area (Vmean):   2.62 cm AV Area (VTI):     2.77 cm AV Vmax:           152.00 cm/s AV Vmean:          100.000 cm/s AV VTI:            0.251 m AV Peak Grad:      9.2 mmHg AV Mean Grad:      5.0 mmHg LVOT Vmax:         111.00 cm/s LVOT Vmean:        69.000 cm/s LVOT VTI:          0.183 m LVOT/AV VTI ratio: 0.73  AORTA Ao Root diam: 3.60 cm Ao Asc diam:  3.60 cm MITRAL VALVE MV Area (PHT): 3.74 cm    SHUNTS MV Decel Time: 203 msec    Systemic VTI:  0.18 m MV E velocity: 61.30 cm/s  Systemic Diam: 2.20 cm MV A velocity: 89.10 cm/s MV E/A ratio:  0.69 Donato Schultz MD Electronically signed by Donato Schultz MD Signature Date/Time: 02/11/2023/11:39:11 AM    Final     Cardiac Studies    Echocardiogram 02/11/2023 1. Left ventricular ejection fraction, by estimation, is 55 to 60%. The  left ventricle has normal function. The left ventricle demonstrates  regional wall motion abnormalities (see scoring diagram/findings for  description). The apical anterior segment is akinetic. There is  mild left ventricular   hypertrophy. Left ventricular diastolic parameters are consistent with  Grade I diastolic dysfunction (impaired relaxation).   2. Right ventricular systolic function is normal. The right ventricular  size is normal.   3. Left atrial size was mildly dilated.   4. Right atrial size was mildly dilated.   5. The mitral valve is normal in structure. No evidence of mitral valve  regurgitation. No evidence of mitral stenosis.   6. The aortic valve is normal in structure. Aortic valve regurgitation is  not visualized. No aortic stenosis is present.   7. The inferior vena cava is normal in size with greater than 50%  respiratory variability, suggesting right atrial pressure of 3 mmHg.   Cath 02/12/23    Prox RCA to Mid RCA lesion is 20% stenosed.   LV end diastolic pressure is moderately elevated.   Mild nonobstructive CAD Moderately elevated LVEDP 28 mm Hg   Plan: patient has MINOCA possibly related to severe uncontrolled HTN. LVEDP is elevated. Will manage medically.     Patient Profile     67 y.o. male with HTN, HLD, hypothyroidism, GERD, CKD 3a, ED, testosterone supplementation admitted with RUQ/epigastric pain, found to have significantly elevated troponin to 10k. Cath 02/12/23 showing mild nonobstructive CAD, elevated LVEDP . Reports he was not taking his medicines reliably prior to admission.  Assessment & Plan    1. NSTEMI (MINOCA due to elevated BP vs myopericarditis), elevated LVEDP c/w acute diastolic HF, HLD - cath 02/12/23 with 20% prox-mid RCA, moderately elevated LVEDP, echo with EF 55-60% with akinetic apical anterior segment, G1DD - > received 40mg  IV dose of Lasix post-cath  - ESR wnl but CRP elevated  - d-dimer only 0.53, normal for age adjusted value - feeling much better with resumption of medical therapy  - continue ASA, BB, statin  - will await input from MD about whether any further w/u needed (?cMRI) or additional antiplatelet therapy -  addendum - MD will  see patient this AM but per Dr. Servando Salina hold off cMRI and add colchicine 0.6mg  daily (lower dose given statin)   2. Essential HTN, severely elevated at times - improved to 132/97 - current rx includes , losartan 100mg  daily (started 5/23), carvedilol 25mg  BID (increased dose 5/23 PM), amlodipine 10mg  daily (missed yesterday's dose, increased to 10mg  5/24), home HCTZ on hold  3. AKI on CKD 3a - presenting Cr 1.72, stable today (prior baseline appears 1.3-1.5)  4. Macrocytosis - Hgb normal - further per IM  Tentatively arranged f/u 6/6, appt on AVS Also reached out to cardiac rehab team who will contact patient to discuss CRP2 Will need input from MD re: return to work (does some lifting). Standard post-cath/non-PCI recommendations usually say no lifting over 5-10 pounds for 1 week but for NSTEMI it's 2 weeks.  For questions or updates, please contact [facility] Please consult [email or web address] for contact info under Cardiology/STEMI.  Signed, Laurann Montana, PA-C 02/13/2023, 7:40 AM     Patient seen and examined, note reviewed with the signed Advanced Practice Provider. I personally reviewed laboratory data, imaging studies and relevant notes. I independently examined the patient and formulated the important aspects of the plan. I have personally discussed the plan with the patient and/or family. Comments or changes to the note/plan are indicated below.  Patient seen examined at his bedside.  No complaints.  Status post left heart catheterization yesterday no noted NSTEMI with nonobstructive disease. Aspirin started, continue this, add Plavix will load 300 mg then 75 mg daily.  CRP also elevated troponin out of proportion we will start colchicine 0.6 mg daily.  Continue atorvastatin 20 mg daily.  Will not pursue cardiac MRI at this time.  Blood pressure is acceptable continue current antihypertensive medication dosing. Will need cardiac rehab will help patient arrange  this. He needs cardiology follow-up in outpatient setting.   Thomasene Ripple DO, MS [facility] Attending Cardiologist [facility]  [address] [place], [place] [address] [phone] Website: [email or web address]

## 2023-02-13 NOTE — Telephone Encounter (Signed)
Patient set for discharge tomorrow.  Will need call Tuesday to follow up on discharge

## 2023-02-13 NOTE — Progress Notes (Signed)
Patient is alert and orientated x4, despite education provided patient refused current continuous IV ordered and he also has refused scheduled SQ Heparin this a.m. NP Virgel Manifold made aware. At time of entry patient is resting in bed in no distress, call bell is within patients reach.

## 2023-02-13 NOTE — Progress Notes (Signed)
  Transition of Care (TOC) Screening Note   Patient Details  Name: Richard Sellers Date of Birth: 02-09-1956   Transition of Care [facility]) CM/SW Contact:    Lavenia Atlas, RN Phone Number: 02/13/2023, 2:37 PM    Transition of Care Department [facility]) has reviewed patient and no TOC needs have been identified at this time. We will continue to monitor patient advancement through interdisciplinary progression rounds. If new patient transition needs arise, please place a TOC consult.

## 2023-02-13 NOTE — Progress Notes (Addendum)
Notified by nursing staff that auto cuff BP was 83/58, MAP 65. Patient asymptomatic at the time. Repeat value was not obtained at the time this was checked. Asked for recheck when available, repeat value later manually 154/86. Subsequent recheck this afternoon 128/88. Got new higher dose of carvedilol this AM but did not yet get amlodipine or losartan this AM. AT home PTA, he was intended to be on amlodipine 5mg , carvedilol 12.5mg  BID, and losartan-HCTZ 100/25mg  daily but was not reliably taking meds. Per MAR review yesterday he missed his AM dose of carvedilol and amlodipine altogether I suspect due to cath transfer, did get losartan 100mg  + higher dose PM carvedilol 25mg .  D/w Dr. Frederick Peers. Suspect low value was spurious, but BP trending better with simply carvedilol alone. We'll plan to keep carvedilol at new higher 25mg  BID dose along with continuation of losartan 100mg  daily (without HCTZ), but hold on resuming amlodipine. Patient advised to follow BP at home and notify if SBP running >130 (at which time we would likely recommend to restart amlodipine). Nurse stressed importance of checking at home. F/u has been arranged. OK to RTW in 2 weeks per Dr. Servando Salina.

## 2023-02-13 NOTE — Progress Notes (Signed)
CARDIAC REHAB PHASE I   Post Mi education including site care, restrictions, risk factors, MI booklet, exercise guidelines, heart healthy diet and CRP2 reviewed via phone call. Pt is inpatient at [facility] step down unit. Will mail written education materials to address provided.   [phone]     Woodroe Chen, RN BSN 02/13/2023 2:57 PM

## 2023-02-14 LAB — LIPOPROTEIN A (LPA): Lipoprotein (a): <8.4 nmol/L (ref <75.0)

## 2023-02-17 NOTE — Telephone Encounter (Signed)
Patient contacted regarding discharge from [facility] on 02/13/2023.  Patient understands to follow up with provider Bernadene Person, NP on 02/17/2023 at 1:55 PM at [facility]. Patient understands discharge instructions? Yes Patient understands medications and regiment? Yes Patient understands to bring all medications to this visit? Yes

## 2023-02-20 ENCOUNTER — Telehealth (HOSPITAL_COMMUNITY): Payer: Self-pay

## 2023-02-20 NOTE — Telephone Encounter (Signed)
Pt is not interested in the cardiac rehab program. Closed referral 

## 2023-02-26 ENCOUNTER — Encounter: Payer: Self-pay | Admitting: Nurse Practitioner

## 2023-02-26 ENCOUNTER — Other Ambulatory Visit: Payer: Self-pay

## 2023-02-26 ENCOUNTER — Ambulatory Visit: Payer: Medicare Other | Attending: Nurse Practitioner | Admitting: Nurse Practitioner

## 2023-02-26 VITALS — BP 112/88 | HR 65 | Ht 68.0 in | Wt 267.4 lb

## 2023-02-26 DIAGNOSIS — I10 Essential (primary) hypertension: Secondary | ICD-10-CM

## 2023-02-26 DIAGNOSIS — I5032 Chronic diastolic (congestive) heart failure: Secondary | ICD-10-CM

## 2023-02-26 DIAGNOSIS — E785 Hyperlipidemia, unspecified: Secondary | ICD-10-CM

## 2023-02-26 DIAGNOSIS — I319 Disease of pericardium, unspecified: Secondary | ICD-10-CM

## 2023-02-26 DIAGNOSIS — I2510 Atherosclerotic heart disease of native coronary artery without angina pectoris: Secondary | ICD-10-CM

## 2023-02-26 DIAGNOSIS — E039 Hypothyroidism, unspecified: Secondary | ICD-10-CM

## 2023-02-26 DIAGNOSIS — N1831 Chronic kidney disease, stage 3a: Secondary | ICD-10-CM

## 2023-02-26 MED ORDER — ATORVASTATIN CALCIUM 20 MG PO TABS: 20.0000 mg | ORAL_TABLET | Freq: Every day | ORAL | 3 refills | Status: DC

## 2023-02-26 MED ORDER — CARVEDILOL 25 MG PO TABS: 25.0000 mg | ORAL_TABLET | Freq: Two times a day (BID) | ORAL | 3 refills | Status: DC

## 2023-02-26 MED ORDER — ASPIRIN 81 MG PO CHEW: 81.0000 mg | CHEWABLE_TABLET | Freq: Every day | ORAL | Status: AC

## 2023-02-26 MED ORDER — COLCHICINE 0.6 MG PO TABS: 0.6000 mg | ORAL_TABLET | Freq: Every day | ORAL | 2 refills | Status: DC

## 2023-02-26 MED ORDER — LOSARTAN POTASSIUM 100 MG PO TABS: 100.0000 mg | ORAL_TABLET | Freq: Every day | ORAL | 3 refills | Status: DC

## 2023-02-26 MED ORDER — CLOPIDOGREL BISULFATE 75 MG PO TABS: 75.0000 mg | ORAL_TABLET | Freq: Every day | ORAL | 3 refills | Status: DC

## 2023-02-26 NOTE — Progress Notes (Signed)
Office Visit    Patient Name: Richard Sellers Date of Encounter: 02/26/2023  Primary Care Provider:  Nelwyn Salisbury, MD Primary Cardiologist:  Thomasene Ripple, DO  Chief Complaint    67 year old male with a history of NSTEMI, nonobstructive CAD, possible myopericarditis, chronic diastolic heart failure, hypertension, hyperlipidemia, CKD stage IIIa, hypothyroidism, and GERD who presents for hospital follow-up s/p NSTEMI.  Past Medical History    Past Medical History:  Diagnosis Date   Allergy    seasonal allergies   Anxiety    on meds   Chronic kidney disease    hx of kidney stones   Depression    on meds   ED (erectile dysfunction)    GERD (gastroesophageal reflux disease)    not on meds-diet controlled-uses OTC meds for tx   Heart murmur    at age 89 years old, heard x1 , never again   History of kidney stones    Hyperlipidemia    on meds   Hypertension    on meds   Hypothyroidism    hx of-was on meds-   PONV (postoperative nausea and vomiting)    Warts, genital    Past Surgical History:  Procedure Laterality Date   APPENDECTOMY  1975   COLONOSCOPY  11/08/2007   per Dr. Jarold Motto, diverticulosis but no polyps, repeat in 10 yrs    CYSTECTOMY     EXTRACORPOREAL SHOCK WAVE LITHOTRIPSY Left 10/29/2017   Procedure: LEFT EXTRACORPOREAL SHOCK WAVE LITHOTRIPSY (ESWL);  Surgeon: Marcine Matar, MD;  Location: [facility];  Service: Urology;  Laterality: Left;   HERNIA REPAIR  07/2017   umbilical hernia   KNEE ARTHROSCOPY Right 1993   LEFT HEART CATH AND CORONARY ANGIOGRAPHY N/A 02/12/2023   Procedure: LEFT HEART CATH AND CORONARY ANGIOGRAPHY;  Surgeon: [place], Peter M, MD;  Location: [facility] INVASIVE CV LAB;  Service: Cardiovascular;  Laterality: N/A;   TONSILLECTOMY     WISDOM TOOTH EXTRACTION  1990    Allergies  No Known Allergies   Labs/Other Studies Reviewed    The following studies were reviewed today:  Cardiac Studies & Procedures   CARDIAC  CATHETERIZATION  CARDIAC CATHETERIZATION 02/13/2023  Narrative   Prox RCA to Mid RCA lesion is 20% stenosed.   LV end diastolic pressure is moderately elevated.  Mild nonobstructive CAD Moderately elevated LVEDP 28 mm Hg  Plan: patient has MINOCA possibly related to severe uncontrolled HTN. LVEDP is elevated. Will manage medically.  Findings Coronary Findings Diagnostic  Dominance: Right  Left Main Vessel was injected. Vessel is normal in caliber. Vessel is angiographically normal.  Left Anterior Descending Vessel was injected. Vessel is normal in caliber. The vessel exhibits minimal luminal irregularities.  Ramus Intermedius Vessel was injected. Vessel is small. Vessel is angiographically normal.  Left Circumflex Vessel was injected. Vessel is normal in caliber. The vessel exhibits minimal luminal irregularities.  Right Coronary Artery Prox RCA to Mid RCA lesion is 20% stenosed.  Intervention  No interventions have been documented.     ECHOCARDIOGRAM  ECHOCARDIOGRAM COMPLETE 02/11/2023  Narrative ECHOCARDIOGRAM REPORT    Patient Name:   Richard Sellers Date of Exam: 02/11/2023 Medical Rec #:  [id number]        Height:       67.0 in Accession #:    [id number]       Weight:       265.0 lb Date of Birth:  Oct 12, 1955       BSA:  2.279 m Patient Age:    66 years         BP:           158/99 mmHg Patient Gender: M                HR:           91 bpm. Exam Location:  Inpatient  Procedure: 2D Echo, Color Doppler, Cardiac Doppler and Intracardiac Opacification Agent  STAT ECHO  Indications:    Chest Pain  History:        Patient has prior history of Echocardiogram examinations, most recent 08/27/2020. Risk Factors:Hypertension.  Sonographer:    Milbert Coulter Referring Phys: [id number] SHENG L HALEY   Sonographer Comments: Technically difficult study due to poor echo windows, suboptimal apical window, suboptimal parasternal window and patient is obese.  Image acquisition challenging due to patient body habitus. IMPRESSIONS   1. Left ventricular ejection fraction, by estimation, is 55 to 60%. The left ventricle has normal function. The left ventricle demonstrates regional wall motion abnormalities (see scoring diagram/findings for description). There is mild left ventricular hypertrophy. Left ventricular diastolic parameters are consistent with Grade I diastolic dysfunction (impaired relaxation). 2. Right ventricular systolic function is normal. The right ventricular size is normal. 3. Left atrial size was mildly dilated. 4. Right atrial size was mildly dilated. 5. The mitral valve is normal in structure. No evidence of mitral valve regurgitation. No evidence of mitral stenosis. 6. The aortic valve is normal in structure. Aortic valve regurgitation is not visualized. No aortic stenosis is present. 7. The inferior vena cava is normal in size with greater than 50% respiratory variability, suggesting right atrial pressure of 3 mmHg.  FINDINGS Left Ventricle: Left ventricular ejection fraction, by estimation, is 55 to 60%. The left ventricle has normal function. The left ventricle demonstrates regional wall motion abnormalities. Definity contrast agent was given IV to delineate the left ventricular endocardial borders. The left ventricular internal cavity size was normal in size. There is mild left ventricular hypertrophy. Left ventricular diastolic parameters are consistent with Grade I diastolic dysfunction (impaired relaxation).   LV Wall Scoring: The apical anterior segment is akinetic.  Right Ventricle: The right ventricular size is normal. No increase in right ventricular wall thickness. Right ventricular systolic function is normal.  Left Atrium: Left atrial size was mildly dilated.  Right Atrium: Right atrial size was mildly dilated.  Pericardium: There is no evidence of pericardial effusion.  Mitral Valve: The mitral valve is normal  in structure. No evidence of mitral valve regurgitation. No evidence of mitral valve stenosis.  Tricuspid Valve: The tricuspid valve is normal in structure. Tricuspid valve regurgitation is not demonstrated. No evidence of tricuspid stenosis.  Aortic Valve: The aortic valve is normal in structure. Aortic valve regurgitation is not visualized. No aortic stenosis is present. Aortic valve mean gradient measures 5.0 mmHg. Aortic valve peak gradient measures 9.2 mmHg. Aortic valve area, by VTI measures 2.77 cm.  Pulmonic Valve: The pulmonic valve was normal in structure. Pulmonic valve regurgitation is not visualized. No evidence of pulmonic stenosis.  Aorta: The aortic root is normal in size and structure.  Venous: The inferior vena cava is normal in size with greater than 50% respiratory variability, suggesting right atrial pressure of 3 mmHg.  IAS/Shunts: No atrial level shunt detected by color flow Doppler.   LEFT VENTRICLE PLAX 2D LVIDd:         4.00 cm   Diastology LVIDs:  2.80 cm   LV e' lateral:   7.94 cm/s LV PW:         1.40 cm   LV E/e' lateral: 7.7 LV IVS:        1.30 cm LVOT diam:     2.20 cm LV SV:         70 LV SV Index:   31 LVOT Area:     3.80 cm   RIGHT VENTRICLE RV S prime:     17.40 cm/s TAPSE (M-mode): 2.7 cm  LEFT ATRIUM             Index        RIGHT ATRIUM           Index LA diam:        3.60 cm 1.58 cm/m   RA Area:     23.50 cm LA Vol (A2C):   74.1 ml 32.51 ml/m  RA Volume:   65.70 ml  28.83 ml/m LA Vol (A4C):   78.9 ml 34.62 ml/m LA Biplane Vol: 78.6 ml 34.49 ml/m AORTIC VALVE AV Area (Vmax):    2.78 cm AV Area (Vmean):   2.62 cm AV Area (VTI):     2.77 cm AV Vmax:           152.00 cm/s AV Vmean:          100.000 cm/s AV VTI:            0.251 m AV Peak Grad:      9.2 mmHg AV Mean Grad:      5.0 mmHg LVOT Vmax:         111.00 cm/s LVOT Vmean:        69.000 cm/s LVOT VTI:          0.183 m LVOT/AV VTI ratio: 0.73  AORTA Ao Root  diam: 3.60 cm Ao Asc diam:  3.60 cm  MITRAL VALVE MV Area (PHT): 3.74 cm    SHUNTS MV Decel Time: 203 msec    Systemic VTI:  0.18 m MV E velocity: 61.30 cm/s  Systemic Diam: 2.20 cm MV A velocity: 89.10 cm/s MV E/A ratio:  0.69  Donato Schultz MD Electronically signed by Donato Schultz MD Signature Date/Time: 02/11/2023/11:39:11 AM    Final            Recent Labs: 02/11/2023: ALT 24 02/12/2023: TSH 2.312 02/13/2023: BUN 16; Creatinine, Ser 1.37; Hemoglobin 15.2; Magnesium 1.8; Platelets 235; Potassium 3.5; Sodium 137  Recent Lipid Panel    Component Value Date/Time   CHOL 186 02/11/2023 0710   TRIG 87 02/11/2023 0710   HDL 34 (L) 02/11/2023 0710   CHOLHDL 5.5 02/11/2023 0710   VLDL 17 02/11/2023 0710   LDLCALC 135 (H) 02/11/2023 0710   LDLDIRECT 138.1 09/03/2007 1118    History of Present Illness    67 year old male with the above past medial history including NSTEMI, nonobstructive CAD, possible myopericarditis, chronic diastolic heart failure, hypertension, hyperlipidemia, CKD stage IIIa, hypothyroidism, and GERD.  He presented to the ED on 02/11/2023 with epigastric pain. He was noted to have significantly elevated troponin cardiology was consulted in the setting of NSTEMI.  Cardiac catheterization on 02/12/2023 revealed mild nonobstructive CAD, elevated LVEDP.  He reported nonadherence to his medication regimen prior to admission.  Echocardiogram showed EF 55 to 60%, akinetic apical anterior segment, G1 DD.  He received IV Lasix.  CRP was elevated. Cardiac MRI was deferred.  He was started on colchicine 0.6 mg daily given concern  for myopericarditis.  Otherwise, his home medications were resumed and he was discharged home in stable condition on 02/13/2023.  He presents today for follow-up.  Since his hospitalization has been stable from a cardiac standpoint.  He denies any symptoms concerning for angina, denies dyspnea, edema, PND.  He is planning to return to work on 02/26/2023 but  feels that an 8-hour shift may be difficult for him to complete at this time due to some ongoing fatigue in the setting of medication changes.  Otherwise, he reports feeling well.   Home Medications    Current Outpatient Medications  Medication Sig Dispense Refill   ALPRAZolam (XANAX) 1 MG tablet Take 1 tablet (1 mg total) by mouth every 6 (six) hours as needed for anxiety. for anxiety 120 tablet 5   aspirin 81 MG chewable tablet Chew 1 tablet (81 mg total) by mouth daily.     atorvastatin (LIPITOR) 20 MG tablet Take 1 tablet (20 mg total) by mouth daily. 30 tablet 3   carvedilol (COREG) 25 MG tablet Take 1 tablet (25 mg total) by mouth 2 (two) times daily with a meal. 60 tablet 3   clopidogrel (PLAVIX) 75 MG tablet Take 1 tablet (75 mg total) by mouth daily. 30 tablet 3   colchicine 0.6 MG tablet Take 1 tablet (0.6 mg total) by mouth daily. 30 tablet 1   losartan (COZAAR) 100 MG tablet Take 1 tablet (100 mg total) by mouth daily. 30 tablet 3   ondansetron (ZOFRAN) 4 MG tablet Take 1 tablet (4 mg total) by mouth every 8 (eight) hours as needed for nausea or vomiting. 30 tablet 2   triamcinolone cream (KENALOG) 0.1 % Apply 1 Application topically 2 (two) times daily as needed. 45 g 5   montelukast (SINGULAIR) 10 MG tablet TAKE 1 TABLET BY MOUTH AT BEDTIME (Patient not taking: Reported on 02/26/2023) 90 tablet 3   testosterone cypionate (DEPOTESTOSTERONE CYPIONATE) 200 MG/ML injection INJECT 1 ML INTRAMUSCULARLY EVERY 7 DAYS (Patient not taking: Reported on 02/26/2023) 4 mL 3   No current facility-administered medications for this visit.     Review of Systems    He denies chest pain, palpitations, dyspnea, pnd, orthopnea, n, v, dizziness, syncope, edema, weight gain, or early satiety. All other systems reviewed and are otherwise negative except as noted above.   Physical Exam    VS:  BP 112/88 (BP Location: Right Arm, Patient Position: Sitting, Cuff Size: Normal)   Pulse 65   Ht 5\' 8"  (1.727  m)   Wt 267 lb 6.4 oz (121.3 kg)   SpO2 94%   BMI 40.66 kg/m   GEN: Well nourished, well developed, in no acute distress. HEENT: normal. Neck: Supple, no JVD, carotid bruits, or masses. Cardiac: RRR, no murmurs, rubs, or gallops. No clubbing, cyanosis, edema.  Radials/DP/PT 2+ and equal bilaterally.  Right radial cath site with minimal bruising, no bleeding or hematoma. Respiratory:  Respirations regular and unlabored, clear to auscultation bilaterally. GI: Soft, nontender, nondistended, BS + x 4. MS: no deformity or atrophy. Skin: warm and dry, no rash. Neuro:  Strength and sensation are intact. Psych: Normal affect.  Accessory Clinical Findings    ECG personally reviewed by me today -NSR, 65 bpm, incomplete RBBB- no acute changes.   Lab Results  Component Value Date   WBC 9.1 02/13/2023   HGB 15.2 02/13/2023   HCT 46.4 02/13/2023   MCV 101.3 (H) 02/13/2023   PLT 235 02/13/2023   Lab Results  Component Value  Date   CREATININE 1.37 (H) 02/13/2023   BUN 16 02/13/2023   NA 137 02/13/2023   K 3.5 02/13/2023   CL 100 02/13/2023   CO2 24 02/13/2023   Lab Results  Component Value Date   ALT 24 02/11/2023   AST 27 02/11/2023   ALKPHOS 65 02/11/2023   BILITOT 1.1 02/11/2023   Lab Results  Component Value Date   CHOL 186 02/11/2023   HDL 34 (L) 02/11/2023   LDLCALC 135 (H) 02/11/2023   LDLDIRECT 138.1 09/03/2007   TRIG 87 02/11/2023   CHOLHDL 5.5 02/11/2023    Lab Results  Component Value Date   HGBA1C 5.2 02/11/2023    Assessment & Plan    1. Nonobstructive CAD: S/p recent NSTEMI. Cath in 02/12/2023 revealed mild nonobstructive CAD, elevated LVEDP. Stable with no anginal symptoms.  He does note some ongoing fatigue in the setting of recent medication changes.  I recommend he limit his work shifts to 6 hours at a time through 03/07/2023 at which time he returned to work without restriction.  Letter provided in office today.  Continue Aspirin, Plavix, carvedilol,  losartan, and Lipitor.   2. Possible myopericarditis: CRP was elevated during recent hospitalization.  Echo showed no evidence of pericardial effusion. He was started on colchicine.  Will reach out to Dr. Servando Salina to verify desired duration of treatment with colchicine.  3. Chronic diastolic heart failure: Echo in 01/2023 showed EF 55 to 60%, akinetic apical anterior segment, G1 DD.  Euvolemic and well compensated on exam.  Indication for loop diuretic at this time.  Continue current medications as above.  4. Hypertension: BP well controlled. Continue current antihypertensive regimen.   5. Hyperlipidemia: LDL was 135 in 01/2023.  Will update fasting lipid panel, CMET in 6 weeks.  6. CKD stage IIIa: Creatinine was 1.37 on 02/13/2023. Repeat CMET pending as above.  7. Hypothyroidism: TSH was 2.312 on 02/12/2023.  Monitored and managed per PCP.  8. Disposition: Follow-up in 2 months.      Joylene Grapes, NP 02/26/2023, 1:42 PM

## 2023-02-26 NOTE — Patient Instructions (Signed)
Medication Instructions:  Your physician recommends that you continue on your current medications as directed. Please refer to the Current Medication list given to you today.  *If you need a refill on your cardiac medications before your next appointment, please call your pharmacy*   Lab Work: Your physician recommends that you return for lab work in 6 weeks.  Fasting Lipid Panel & CMET  Testing/Procedures: NONE ordered at this time of appointment     Follow-Up: At [facility], you and your health needs are our priority.  As part of our continuing mission to provide you with exceptional heart care, we have created designated Provider Care Teams.  These Care Teams include your primary Cardiologist (physician) and Advanced Practice Providers (APPs -  Physician Assistants and Nurse Practitioners) who all work together to provide you with the care you need, when you need it.  We recommend signing up for the patient portal called "MyChart".  Sign up information is provided on this After Visit Summary.  MyChart is used to connect with patients for Virtual Visits (Telemedicine).  Patients are able to view lab/test results, encounter notes, upcoming appointments, etc.  Non-urgent messages can be sent to your provider as well.   To learn more about what you can do with MyChart, go to [email or web address].    Your next appointment:   2 month(s)  Provider:   Bernadene Person, NP        Other Instructions

## 2023-02-27 NOTE — Addendum Note (Signed)
Addended by: Berlinda Last on: 02/27/2023 02:50 PM   Modules accepted: Orders

## 2023-03-13 ENCOUNTER — Encounter: Payer: Self-pay | Admitting: Family Medicine

## 2023-03-17 NOTE — Telephone Encounter (Signed)
This is probably okay but if he gets fever or red streaks going up his arm or if the arm feels warm to the touch he should make an appt

## 2023-03-29 ENCOUNTER — Encounter: Payer: Self-pay | Admitting: Family Medicine

## 2023-03-31 ENCOUNTER — Encounter: Payer: Self-pay | Admitting: Cardiology

## 2023-03-31 NOTE — Telephone Encounter (Signed)
Patient on Coreg 25 mg BID. States fatigue.  Do see this was an issue after hospital F/U.  Please advise

## 2023-03-31 NOTE — Telephone Encounter (Signed)
I suspect he is referring to the Carvedilol. He was put on a high dose at the hospital. He should relay his concern to Cardiology, possibly they could reduce the dose

## 2023-04-13 ENCOUNTER — Telehealth: Payer: Self-pay

## 2023-04-13 ENCOUNTER — Encounter: Payer: Self-pay | Admitting: Cardiology

## 2023-04-13 NOTE — Telephone Encounter (Signed)
He has been on colchicine since June

## 2023-04-13 NOTE — Telephone Encounter (Signed)
Patient states that lately he has been feeling really tired since starting colchicine He states his blood pressure has been a  little elevated. No chest pain, shortness of breath, headache, nausea or vomiting. Has had some pain in his bicep that comes and goes. Did advise to  stay hydrated will forward to provider.

## 2023-04-14 NOTE — Telephone Encounter (Signed)
Colchicine typically does not cause fatigue or elevated BP. Could cause myalgias though, especially since he is also on atorvastatin (drug interaction between the two that increases risk of rhabdo). Would defer to MD for possible med changes.

## 2023-04-19 ENCOUNTER — Encounter: Payer: Self-pay | Admitting: Family Medicine

## 2023-04-21 NOTE — Telephone Encounter (Signed)
He already has refills to last until Sept. 8

## 2023-04-22 NOTE — Telephone Encounter (Signed)
Attempted to call pt. No answer, voicemail not set up. Unable to leave a message.

## 2023-04-28 ENCOUNTER — Other Ambulatory Visit: Payer: Self-pay | Admitting: Family Medicine

## 2023-04-28 DIAGNOSIS — I10 Essential (primary) hypertension: Secondary | ICD-10-CM

## 2023-04-28 NOTE — Telephone Encounter (Signed)
Was last filled by Joylene Grapes, NP.  Okay to refill?

## 2023-04-30 NOTE — Progress Notes (Signed)
Cardiology Office Note:  .   Date:  05/01/2023  ID:  Sheral Apley, DOB August 30, 1956, MRN [id number] PCP: Nelwyn Salisbury, MD  [facility] Providers Cardiologist:  Thomasene Ripple, DO    History of Present Illness: .    67 year old male with a history of NSTEMI, nonobstructive CAD, possible myopericarditis, chronic diastolic heart failure, hypertension, hyperlipidemia, CKD stage IIIa, hypothyroidism, and GERD.   He presented to the ED on 02/11/2023 with epigastric pain. He was noted to have significantly elevated troponin cardiology was consulted in the setting of NSTEMI.  Cardiac catheterization on 02/12/2023 revealed mild nonobstructive CAD, elevated LVEDP.  He reported nonadherence to his medication regimen prior to admission.  Echocardiogram showed EF 55 to 60%, akinetic apical anterior segment, G1 DD.  He received IV Lasix.  CRP was elevated. Cardiac MRI was deferred.  He was started on colchicine 0.6 mg daily given concern for myopericarditis.  Otherwise, his home medications were resumed and he was discharged home in stable condition on 02/13/2023.  He was last seen in the office on 02/26/2023 and was stable from a cardiac standpoint.  He did note some fatigue.  He contacted our office in July 2024 with concern for fatigue, myalgias.  He was advised to stop colchicine given increased risk for rhabdomyolysis with chronic combined atorvastatin.  He never discontinued this medication.  Additionally, carvedilol was decreased to 12.5 mg twice daily.    He presents today for follow-up. Since his last visit he has been stable from a cardiac standpoint.  He denies any symptoms concerning for angina, denies dyspnea, edema, PND, orthopnea, weight gain. He has noted some ongoing fatigue. He never stopped taking his colchicine. He also reports arthralgias/myalgias since starting back on his medications. Otherwise, he reports feeling well.    ROS: He denies chest pain, palpitations, dyspnea, pnd, orthopnea,  n, v, dizziness, syncope, edema, weight gain, or early satiety. All other systems reviewed and are otherwise negative except as noted above.   Studies Reviewed: .       Cardiac Studies & Procedures   CARDIAC CATHETERIZATION  CARDIAC CATHETERIZATION 02/12/2023  Narrative   Prox RCA to Mid RCA lesion is 20% stenosed.   LV end diastolic pressure is moderately elevated.  Mild nonobstructive CAD Moderately elevated LVEDP 28 mm Hg  Plan: patient has MINOCA possibly related to severe uncontrolled HTN. LVEDP is elevated. Will manage medically.  Findings Coronary Findings Diagnostic  Dominance: Right  Left Main Vessel was injected. Vessel is normal in caliber. Vessel is angiographically normal.  Left Anterior Descending Vessel was injected. Vessel is normal in caliber. The vessel exhibits minimal luminal irregularities.  Ramus Intermedius Vessel was injected. Vessel is small. Vessel is angiographically normal.  Left Circumflex Vessel was injected. Vessel is normal in caliber. The vessel exhibits minimal luminal irregularities.  Right Coronary Artery Prox RCA to Mid RCA lesion is 20% stenosed.  Intervention  No interventions have been documented.     ECHOCARDIOGRAM  ECHOCARDIOGRAM COMPLETE 02/11/2023  Narrative ECHOCARDIOGRAM REPORT    Patient Name:   NAPOLEON YERENA Date of Exam: 02/11/2023 Medical Rec #:  [id number]        Height:       67.0 in Accession #:    [id number]       Weight:       265.0 lb Date of Birth:  [birth year]       BSA:          2.279 m Patient Age:  66 years         BP:           158/99 mmHg Patient Gender: M                HR:           91 bpm. Exam Location:  Inpatient  Procedure: 2D Echo, Color Doppler, Cardiac Doppler and Intracardiac Opacification Agent  STAT ECHO  Indications:    Chest Pain  History:        Patient has prior history of Echocardiogram examinations, most recent 08/27/2020. Risk Factors:Hypertension.  Sonographer:     Milbert Coulter Referring Phys: [id number] SHENG L HALEY   Sonographer Comments: Technically difficult study due to poor echo windows, suboptimal apical window, suboptimal parasternal window and patient is obese. Image acquisition challenging due to patient body habitus. IMPRESSIONS   1. Left ventricular ejection fraction, by estimation, is 55 to 60%. The left ventricle has normal function. The left ventricle demonstrates regional wall motion abnormalities (see scoring diagram/findings for description). There is mild left ventricular hypertrophy. Left ventricular diastolic parameters are consistent with Grade I diastolic dysfunction (impaired relaxation). 2. Right ventricular systolic function is normal. The right ventricular size is normal. 3. Left atrial size was mildly dilated. 4. Right atrial size was mildly dilated. 5. The mitral valve is normal in structure. No evidence of mitral valve regurgitation. No evidence of mitral stenosis. 6. The aortic valve is normal in structure. Aortic valve regurgitation is not visualized. No aortic stenosis is present. 7. The inferior vena cava is normal in size with greater than 50% respiratory variability, suggesting right atrial pressure of 3 mmHg.  FINDINGS Left Ventricle: Left ventricular ejection fraction, by estimation, is 55 to 60%. The left ventricle has normal function. The left ventricle demonstrates regional wall motion abnormalities. Definity contrast agent was given IV to delineate the left ventricular endocardial borders. The left ventricular internal cavity size was normal in size. There is mild left ventricular hypertrophy. Left ventricular diastolic parameters are consistent with Grade I diastolic dysfunction (impaired relaxation).   LV Wall Scoring: The apical anterior segment is akinetic.  Right Ventricle: The right ventricular size is normal. No increase in right ventricular wall thickness. Right ventricular systolic function is  normal.  Left Atrium: Left atrial size was mildly dilated.  Right Atrium: Right atrial size was mildly dilated.  Pericardium: There is no evidence of pericardial effusion.  Mitral Valve: The mitral valve is normal in structure. No evidence of mitral valve regurgitation. No evidence of mitral valve stenosis.  Tricuspid Valve: The tricuspid valve is normal in structure. Tricuspid valve regurgitation is not demonstrated. No evidence of tricuspid stenosis.  Aortic Valve: The aortic valve is normal in structure. Aortic valve regurgitation is not visualized. No aortic stenosis is present. Aortic valve mean gradient measures 5.0 mmHg. Aortic valve peak gradient measures 9.2 mmHg. Aortic valve area, by VTI measures 2.77 cm.  Pulmonic Valve: The pulmonic valve was normal in structure. Pulmonic valve regurgitation is not visualized. No evidence of pulmonic stenosis.  Aorta: The aortic root is normal in size and structure.  Venous: The inferior vena cava is normal in size with greater than 50% respiratory variability, suggesting right atrial pressure of 3 mmHg.  IAS/Shunts: No atrial level shunt detected by color flow Doppler.   LEFT VENTRICLE PLAX 2D LVIDd:         4.00 cm   Diastology LVIDs:         2.80 cm  LV e' lateral:   7.94 cm/s LV PW:         1.40 cm   LV E/e' lateral: 7.7 LV IVS:        1.30 cm LVOT diam:     2.20 cm LV SV:         70 LV SV Index:   31 LVOT Area:     3.80 cm   RIGHT VENTRICLE RV S prime:     17.40 cm/s TAPSE (M-mode): 2.7 cm  LEFT ATRIUM             Index        RIGHT ATRIUM           Index LA diam:        3.60 cm 1.58 cm/m   RA Area:     23.50 cm LA Vol (A2C):   74.1 ml 32.51 ml/m  RA Volume:   65.70 ml  28.83 ml/m LA Vol (A4C):   78.9 ml 34.62 ml/m LA Biplane Vol: 78.6 ml 34.49 ml/m AORTIC VALVE AV Area (Vmax):    2.78 cm AV Area (Vmean):   2.62 cm AV Area (VTI):     2.77 cm AV Vmax:           152.00 cm/s AV Vmean:          100.000  cm/s AV VTI:            0.251 m AV Peak Grad:      9.2 mmHg AV Mean Grad:      5.0 mmHg LVOT Vmax:         111.00 cm/s LVOT Vmean:        69.000 cm/s LVOT VTI:          0.183 m LVOT/AV VTI ratio: 0.73  AORTA Ao Root diam: 3.60 cm Ao Asc diam:  3.60 cm  MITRAL VALVE MV Area (PHT): 3.74 cm    SHUNTS MV Decel Time: 203 msec    Systemic VTI:  0.18 m MV E velocity: 61.30 cm/s  Systemic Diam: 2.20 cm MV A velocity: 89.10 cm/s MV E/A ratio:  0.69  Donato Schultz MD Electronically signed by Donato Schultz MD Signature Date/Time: 02/11/2023/11:39:11 AM    Final              Risk Assessment/Calculations:             Physical Exam:   VS:  BP 110/76   Pulse 76   Ht 5\' 8"  (1.727 m)   Wt 280 lb (127 kg)   BMI 42.57 kg/m    Wt Readings from Last 3 Encounters:  05/01/23 280 lb (127 kg)  02/26/23 267 lb 6.4 oz (121.3 kg)  02/12/23 280 lb 13.9 oz (127.4 kg)    GEN: Well nourished, well developed in no acute distress NECK: No JVD; No carotid bruits CARDIAC: RRR, no murmurs, rubs, gallops RESPIRATORY:  Clear to auscultation without rales, wheezing or rhonchi  ABDOMEN: Soft, non-tender, non-distended EXTREMITIES:  No edema; No deformity   ASSESSMENT AND PLAN: .     Nonobstructive CAD: S/p NSTEMI in 01/2023, LHC on 02/12/2023 revealed mild nonobstructive CAD, elevated LVEDP. Stable with no anginal symptoms. No indication for ischemic evaluation.Continue aspirin, Plavix, carvedilol, losartan and Lipitor as below.  Hx of possible myocarditis: During admission in 01/2023 his CRP was elevated.  Echo showed no evidence of pericardial effusion.  He was started on colchicine.  He was advised to stop taking colchicine due to fatigue/myalgias  given concern for possible side effects/rhabdo combined with atorvastatin, however, he never stopped the medication.  He notes ongoing fatigue, myalgias/arthralgias.  Will check CK.  Will discontinue colchicine.  Additionally, advised statin holiday as  below.  Chronic diastolic HF: Echo in 01/2023 showed EF 55 to 60%, akinetic apical anterior segment, G1 DD. Euvolemic and well compensated on exam He has noticed some ongoing fatigue.  Will decrease carvedilol to 6.25 mg twice daily.  Continue to monitor BP, symptoms. No indication for loop diuretic at this time.  Continue losartan.  Hypertension: BP well controlled. Continue current antihypertensive regimen.   Hyperlipidemia: Last lipid profile indicated LDL 135 in 01/2023.  He does report some myalgias/arthralgias since resuming atorvastatin.  Advised statin holiday.  Will update fasting lipid panel, CMET, CK as above.  If symptoms improved with statin holiday, consider transitioning to Crestor to see if he tolerates this better.  If he is unable to tolerate statin therapy, recommend referral to lipid clinic Pharm.D. for consideration of alternative lipid-lowering therapy.  CKD stage IIIa: Creatinine was 1.37 on 02/13/23. Repeat CMET pending as above.   Hypothyroidism: TSH was 2.312 on 02/12/23. Monitored and managed by PCP.        Dispo: Follow-up in 4 to 6 weeks.  Signed, Joylene Grapes, NP

## 2023-05-01 ENCOUNTER — Encounter: Payer: Self-pay | Admitting: Nurse Practitioner

## 2023-05-01 ENCOUNTER — Ambulatory Visit: Payer: Medicare Other | Attending: Nurse Practitioner | Admitting: Nurse Practitioner

## 2023-05-01 ENCOUNTER — Other Ambulatory Visit: Payer: Self-pay

## 2023-05-01 VITALS — BP 110/76 | HR 76 | Ht 68.0 in | Wt 280.0 lb

## 2023-05-01 DIAGNOSIS — E785 Hyperlipidemia, unspecified: Secondary | ICD-10-CM | POA: Diagnosis not present

## 2023-05-01 DIAGNOSIS — I2510 Atherosclerotic heart disease of native coronary artery without angina pectoris: Secondary | ICD-10-CM

## 2023-05-01 DIAGNOSIS — I319 Disease of pericardium, unspecified: Secondary | ICD-10-CM

## 2023-05-01 DIAGNOSIS — I10 Essential (primary) hypertension: Secondary | ICD-10-CM | POA: Diagnosis not present

## 2023-05-01 DIAGNOSIS — I5032 Chronic diastolic (congestive) heart failure: Secondary | ICD-10-CM

## 2023-05-01 DIAGNOSIS — E039 Hypothyroidism, unspecified: Secondary | ICD-10-CM

## 2023-05-01 DIAGNOSIS — N1831 Chronic kidney disease, stage 3a: Secondary | ICD-10-CM

## 2023-05-01 MED ORDER — CARVEDILOL 6.25 MG PO TABS: 6.2500 mg | ORAL_TABLET | Freq: Two times a day (BID) | ORAL | 3 refills | Status: DC

## 2023-05-01 NOTE — Patient Instructions (Addendum)
Medication Instructions:  Stop Colchine 0.6 mg as directed Start Carvedilol 6.25 mg twice daily Hold Atorvastatin for 2-4 weeks as directed  *If you need a refill on your cardiac medications before your next appointment, please call your pharmacy*   Lab Work: Fasting lipid panel, CK level today  Testing/Procedures: NONE ordered at this time of appointment     Follow-Up: At [facility], you and your health needs are our priority.  As part of our continuing mission to provide you with exceptional heart care, we have created designated Provider Care Teams.  These Care Teams include your primary Cardiologist (physician) and Advanced Practice Providers (APPs -  Physician Assistants and Nurse Practitioners) who all work together to provide you with the care you need, when you need it.  We recommend signing up for the patient portal called "MyChart".  Sign up information is provided on this After Visit Summary.  MyChart is used to connect with patients for Virtual Visits (Telemedicine).  Patients are able to view lab/test results, encounter notes, upcoming appointments, etc.  Non-urgent messages can be sent to your provider as well.   To learn more about what you can do with MyChart, go to [email or web address].    Your next appointment:    4-6 weeks  Provider:   Bernadene Person, NP

## 2023-05-01 NOTE — Addendum Note (Signed)
Addended by: Bernadene Person C on: 05/01/2023 04:53 PM   Modules accepted: Orders

## 2023-05-01 NOTE — Addendum Note (Signed)
Addended by: Bernadene Person C on: 05/01/2023 04:52 PM   Modules accepted: Orders

## 2023-05-01 NOTE — Addendum Note (Signed)
Addended by: Joylene Grapes on: 05/01/2023 04:38 PM   Modules accepted: Orders

## 2023-05-16 ENCOUNTER — Observation Stay (HOSPITAL_COMMUNITY)
Admission: EM | Admit: 2023-05-16 | Discharge: 2023-05-17 | Disposition: A | Discharge status: Home / Self Care | Payer: Medicare Other | Attending: Internal Medicine | Admitting: Internal Medicine

## 2023-05-16 ENCOUNTER — Observation Stay (HOSPITAL_COMMUNITY): Payer: Medicare Other

## 2023-05-16 ENCOUNTER — Other Ambulatory Visit: Payer: Self-pay

## 2023-05-16 ENCOUNTER — Emergency Department (HOSPITAL_COMMUNITY): Payer: Medicare Other

## 2023-05-16 DIAGNOSIS — Z79899 Other long term (current) drug therapy: Secondary | ICD-10-CM | POA: Insufficient documentation

## 2023-05-16 DIAGNOSIS — R41841 Cognitive communication deficit: Secondary | ICD-10-CM | POA: Diagnosis not present

## 2023-05-16 DIAGNOSIS — R519 Headache, unspecified: Secondary | ICD-10-CM | POA: Diagnosis not present

## 2023-05-16 DIAGNOSIS — N189 Chronic kidney disease, unspecified: Secondary | ICD-10-CM | POA: Insufficient documentation

## 2023-05-16 DIAGNOSIS — E039 Hypothyroidism, unspecified: Secondary | ICD-10-CM | POA: Diagnosis not present

## 2023-05-16 DIAGNOSIS — G4489 Other headache syndrome: Secondary | ICD-10-CM | POA: Diagnosis not present

## 2023-05-16 DIAGNOSIS — R9431 Abnormal electrocardiogram [ECG] [EKG]: Secondary | ICD-10-CM | POA: Diagnosis not present

## 2023-05-16 DIAGNOSIS — Z7902 Long term (current) use of antithrombotics/antiplatelets: Secondary | ICD-10-CM | POA: Diagnosis not present

## 2023-05-16 DIAGNOSIS — I2510 Atherosclerotic heart disease of native coronary artery without angina pectoris: Secondary | ICD-10-CM | POA: Diagnosis not present

## 2023-05-16 DIAGNOSIS — I6782 Cerebral ischemia: Secondary | ICD-10-CM | POA: Diagnosis not present

## 2023-05-16 DIAGNOSIS — R29818 Other symptoms and signs involving the nervous system: Secondary | ICD-10-CM | POA: Diagnosis not present

## 2023-05-16 DIAGNOSIS — I129 Hypertensive chronic kidney disease with stage 1 through stage 4 chronic kidney disease, or unspecified chronic kidney disease: Secondary | ICD-10-CM | POA: Diagnosis not present

## 2023-05-16 DIAGNOSIS — Z87891 Personal history of nicotine dependence: Secondary | ICD-10-CM | POA: Diagnosis not present

## 2023-05-16 DIAGNOSIS — I10 Essential (primary) hypertension: Secondary | ICD-10-CM | POA: Diagnosis not present

## 2023-05-16 DIAGNOSIS — R531 Weakness: Secondary | ICD-10-CM | POA: Diagnosis not present

## 2023-05-16 DIAGNOSIS — I639 Cerebral infarction, unspecified: Principal | ICD-10-CM | POA: Insufficient documentation

## 2023-05-16 DIAGNOSIS — Z7982 Long term (current) use of aspirin: Secondary | ICD-10-CM | POA: Diagnosis not present

## 2023-05-16 LAB — CBC
HCT: 49.1 % (ref 39.0–52.0)
Hemoglobin: 16.3 g/dL (ref 13.0–17.0)
MCH: 32.3 pg (ref 26.0–34.0)
MCHC: 33.2 g/dL (ref 30.0–36.0)
MCV: 97.2 fL (ref 80.0–100.0)
Platelets: 283 10*3/uL (ref 150–400)
RBC: 5.05 MIL/uL (ref 4.22–5.81)
RDW: 15.9 % — ABNORMAL HIGH (ref 11.5–15.5)
WBC: 6.7 10*3/uL (ref 4.0–10.5)
nRBC: 0 % (ref 0.0–0.2)

## 2023-05-16 LAB — COMPREHENSIVE METABOLIC PANEL
ALT: 33 U/L (ref 0–44)
AST: 34 U/L (ref 15–41)
Albumin: 3.7 g/dL (ref 3.5–5.0)
Alkaline Phosphatase: 66 U/L (ref 38–126)
Anion gap: 12 (ref 5–15)
BUN: 9 mg/dL (ref 8–23)
CO2: 22 mmol/L (ref 22–32)
Calcium: 9.1 mg/dL (ref 8.9–10.3)
Chloride: 101 mmol/L (ref 98–111)
Creatinine, Ser: 1.64 mg/dL — ABNORMAL HIGH (ref 0.61–1.24)
GFR, Estimated: 46 mL/min — ABNORMAL LOW (ref >60)
Glucose, Bld: 127 mg/dL — ABNORMAL HIGH (ref 70–99)
Potassium: 4 mmol/L (ref 3.5–5.1)
Sodium: 135 mmol/L (ref 135–145)
Total Bilirubin: 1.8 mg/dL — ABNORMAL HIGH (ref 0.3–1.2)
Total Protein: 7.1 g/dL (ref 6.5–8.1)

## 2023-05-16 LAB — DIFFERENTIAL
Abs Immature Granulocytes: 0.06 10*3/uL (ref 0.00–0.07)
Basophils Absolute: 0.1 10*3/uL (ref 0.0–0.1)
Basophils Relative: 1 %
Eosinophils Absolute: 0.2 10*3/uL (ref 0.0–0.5)
Eosinophils Relative: 2 %
Immature Granulocytes: 1 %
Lymphocytes Relative: 13 %
Lymphs Abs: 0.9 10*3/uL (ref 0.7–4.0)
Monocytes Absolute: 0.4 10*3/uL (ref 0.1–1.0)
Monocytes Relative: 6 %
Neutro Abs: 5.2 10*3/uL (ref 1.7–7.7)
Neutrophils Relative %: 77 %

## 2023-05-16 LAB — RAPID URINE DRUG SCREEN, HOSP PERFORMED
Amphetamines: NOT DETECTED
Barbiturates: NOT DETECTED
Benzodiazepines: POSITIVE — AB
Cocaine: NOT DETECTED
Opiates: NOT DETECTED
Tetrahydrocannabinol: NOT DETECTED

## 2023-05-16 LAB — ETHANOL: Alcohol, Ethyl (B): <10 mg/dL (ref <10)

## 2023-05-16 MED ORDER — SENNOSIDES-DOCUSATE SODIUM 8.6-50 MG PO TABS: 1.0000 | ORAL_TABLET | Freq: Every evening | ORAL | Status: DC | PRN

## 2023-05-16 MED ORDER — ENOXAPARIN SODIUM 40 MG/0.4ML IJ SOSY
40.0000 mg | PREFILLED_SYRINGE | INTRAMUSCULAR | Status: DC
  Administered 2023-05-16: 40 mg via SUBCUTANEOUS
  Filled 2023-05-16: qty 0.4

## 2023-05-16 MED ORDER — LORAZEPAM 1 MG PO TABS
1.0000 mg | ORAL_TABLET | Freq: Once | ORAL | Status: AC
  Administered 2023-05-16: 1 mg via ORAL
  Filled 2023-05-16: qty 1

## 2023-05-16 MED ORDER — ALPRAZOLAM 0.5 MG PO TABS
1.0000 mg | ORAL_TABLET | Freq: Four times a day (QID) | ORAL | Status: DC | PRN
  Administered 2023-05-17 (×2): 1 mg via ORAL
  Filled 2023-05-16 (×2): qty 2

## 2023-05-16 MED ORDER — ACETAMINOPHEN 160 MG/5ML PO SOLN: 650.0000 mg | ORAL | Status: DC | PRN

## 2023-05-16 MED ORDER — ONDANSETRON 4 MG PO TBDP: 4.0000 mg | ORAL_TABLET | Freq: Once | ORAL | Status: DC

## 2023-05-16 MED ORDER — ACETAMINOPHEN 325 MG PO TABS
650.0000 mg | ORAL_TABLET | ORAL | Status: DC | PRN
  Administered 2023-05-16 – 2023-05-17 (×2): 650 mg via ORAL
  Filled 2023-05-16 (×2): qty 2

## 2023-05-16 MED ORDER — ATORVASTATIN CALCIUM 10 MG PO TABS: 20.0000 mg | ORAL_TABLET | Freq: Every day | ORAL | Status: DC

## 2023-05-16 MED ORDER — ACETAMINOPHEN 650 MG RE SUPP: 650.0000 mg | RECTAL | Status: DC | PRN

## 2023-05-16 MED ORDER — ATORVASTATIN CALCIUM 80 MG PO TABS
80.0000 mg | ORAL_TABLET | Freq: Every day | ORAL | Status: DC
  Administered 2023-05-17: 80 mg via ORAL
  Filled 2023-05-16: qty 1

## 2023-05-16 MED ORDER — SODIUM CHLORIDE 0.9 % IV SOLN: INTRAVENOUS | Status: DC

## 2023-05-16 MED ORDER — CLOPIDOGREL BISULFATE 75 MG PO TABS
75.0000 mg | ORAL_TABLET | Freq: Every day | ORAL | Status: DC
  Administered 2023-05-17: 75 mg via ORAL
  Filled 2023-05-16: qty 1

## 2023-05-16 MED ORDER — ASPIRIN 81 MG PO CHEW
81.0000 mg | CHEWABLE_TABLET | Freq: Every day | ORAL | Status: DC
  Administered 2023-05-17: 81 mg via ORAL
  Filled 2023-05-16: qty 1

## 2023-05-16 MED ORDER — ONDANSETRON HCL 4 MG/2ML IJ SOLN
4.0000 mg | Freq: Four times a day (QID) | INTRAMUSCULAR | Status: DC | PRN
  Administered 2023-05-17: 4 mg via INTRAVENOUS
  Filled 2023-05-16: qty 2

## 2023-05-16 MED ORDER — STROKE: EARLY STAGES OF RECOVERY BOOK
Freq: Once | Status: AC
  Filled 2023-05-16: qty 1

## 2023-05-16 MED ORDER — METOCLOPRAMIDE HCL 5 MG/ML IJ SOLN
10.0000 mg | Freq: Once | INTRAMUSCULAR | Status: AC
  Administered 2023-05-16: 10 mg via INTRAVENOUS
  Filled 2023-05-16: qty 2

## 2023-05-16 MED ORDER — TRIAMCINOLONE ACETONIDE 0.1 % EX CREA: 1.0000 | TOPICAL_CREAM | Freq: Two times a day (BID) | CUTANEOUS | Status: DC | PRN

## 2023-05-16 NOTE — ED Triage Notes (Signed)
Patient BIB EMS coming from home c/o left arm weakness with numbness that started at 12:30 today. Symptoms started with headache @0400 . Hypertensive with anxiety. 193/122, 80, 95% via 4liters. CBG 155, 99.0

## 2023-05-16 NOTE — ED Notes (Signed)
ED TO INPATIENT HANDOFF REPORT  ED Nurse Name and Phone #:  Theadora Rama RN [phone]  S Name/Age/Gender Richard Sellers 67 y.o. male Room/Bed: 002C/002C  Code Status   Code Status: Prior  Home/SNF/Other Home Patient oriented to: self, place, time, and situation Is this baseline? Yes   Triage Complete: Triage complete  Chief Complaint CVA (cerebral vascular accident) [facility]) [I63.9]  Triage Note Patient BIB EMS coming from home c/o left arm weakness with numbness that started at 12:30 today. Symptoms started with headache @0400 . Hypertensive with anxiety. 193/122, 80, 95% via 4liters. CBG 155, 99.0   Allergies No Known Allergies  Level of Care/Admitting Diagnosis ED Disposition     ED Disposition  Admit   Condition  --   Comment  Hospital Area: [facility] [[id number]]  Level of Care: Telemetry Medical [104]  May place patient in observation at [facility] or [facility] if equivalent level of care is available:: No  Covid Evaluation: Asymptomatic - no recent exposure (last 10 days) testing not required  Diagnosis: CVA (cerebral vascular accident) [facility]) [[id number]]  Admitting Physician: Tonye Royalty [[id number]]  Attending Physician: Janann Colonel          B Medical/Surgery History Past Medical History:  Diagnosis Date   Allergy    seasonal allergies   Anxiety    on meds   Chronic kidney disease    hx of kidney stones   Depression    on meds   ED (erectile dysfunction)    GERD (gastroesophageal reflux disease)    not on meds-diet controlled-uses OTC meds for tx   Heart murmur    at age 89 years old, heard x1 , never again   History of kidney stones    Hyperlipidemia    on meds   Hypertension    on meds   Hypothyroidism    hx of-was on meds-   PONV (postoperative nausea and vomiting)    Warts, genital    Past Surgical History:  Procedure Laterality Date   APPENDECTOMY  1975   COLONOSCOPY  11/08/2007   per Dr.  Jarold Motto, diverticulosis but no polyps, repeat in 10 yrs    CYSTECTOMY     EXTRACORPOREAL SHOCK WAVE LITHOTRIPSY Left 10/29/2017   Procedure: LEFT EXTRACORPOREAL SHOCK WAVE LITHOTRIPSY (ESWL);  Surgeon: Marcine Matar, MD;  Location: [facility]S;  Service: Urology;  Laterality: Left;   HERNIA REPAIR  07/2017   umbilical hernia   KNEE ARTHROSCOPY Right 1993   LEFT HEART CATH AND CORONARY ANGIOGRAPHY N/A 02/12/2023   Procedure: LEFT HEART CATH AND CORONARY ANGIOGRAPHY;  Surgeon: [place], Peter M, MD;  Location: [facility] INVASIVE CV LAB;  Service: Cardiovascular;  Laterality: N/A;   TONSILLECTOMY     WISDOM TOOTH EXTRACTION  1990     A IV Location/Drains/Wounds Patient Lines/Drains/Airways Status     Active Line/Drains/Airways     Name Placement date Placement time Site Days   Peripheral IV 05/16/23 18 G Anterior;Left;Proximal Forearm 05/16/23  1626  Forearm  less than 1            Intake/Output Last 24 hours No intake or output data in the 24 hours ending 05/16/23 1950  Labs/Imaging Results for orders placed or performed during the hospital encounter of 05/16/23 (from the past 48 hour(s))  CBC     Status: Abnormal   Collection Time: 05/16/23  5:45 PM  Result Value Ref Range   WBC 6.7 4.0 - 10.5 K/uL   RBC 5.05  4.22 - 5.81 MIL/uL   Hemoglobin 16.3 13.0 - 17.0 g/dL   HCT 95.2 84.1 - 32.4 %   MCV 97.2 80.0 - 100.0 fL   MCH 32.3 26.0 - 34.0 pg   MCHC 33.2 30.0 - 36.0 g/dL   RDW 40.1 (H) 02.7 - 25.3 %   Platelets 283 150 - 400 K/uL   nRBC 0.0 0.0 - 0.2 %    Comment: Performed at [facility], [address] [place], [place] [address]  Differential     Status: None   Collection Time: 05/16/23  5:45 PM  Result Value Ref Range   Neutrophils Relative % 77 %   Neutro Abs 5.2 1.7 - 7.7 K/uL   Lymphocytes Relative 13 %   Lymphs Abs 0.9 0.7 - 4.0 K/uL   Monocytes Relative 6 %   Monocytes Absolute 0.4 0.1 - 1.0 K/uL   Eosinophils Relative 2 %   Eosinophils Absolute 0.2 0.0 - 0.5  K/uL   Basophils Relative 1 %   Basophils Absolute 0.1 0.0 - 0.1 K/uL   Immature Granulocytes 1 %   Abs Immature Granulocytes 0.06 0.00 - 0.07 K/uL    Comment: Performed at [facility], [address], [place], [place] [address]  Comprehensive metabolic panel     Status: Abnormal   Collection Time: 05/16/23  5:45 PM  Result Value Ref Range   Sodium 135 135 - 145 mmol/L   Potassium 4.0 3.5 - 5.1 mmol/L   Chloride 101 98 - 111 mmol/L   CO2 22 22 - 32 mmol/L   Glucose, Bld 127 (H) 70 - 99 mg/dL    Comment: Glucose reference range applies only to samples taken after fasting for at least 8 hours.   BUN 9 8 - 23 mg/dL   Creatinine, Ser 5.95 (H) 0.61 - 1.24 mg/dL   Calcium 9.1 8.9 - 63.8 mg/dL   Total Protein 7.1 6.5 - 8.1 g/dL   Albumin 3.7 3.5 - 5.0 g/dL   AST 34 15 - 41 U/L   ALT 33 0 - 44 U/L   Alkaline Phosphatase 66 38 - 126 U/L   Total Bilirubin 1.8 (H) 0.3 - 1.2 mg/dL   GFR, Estimated 46 (L) >60 mL/min    Comment: (NOTE) Calculated using the CKD-EPI Creatinine Equation (2021)    Anion gap 12 5 - 15    Comment: Performed at [facility], [address], [place], [place] [address]  Ethanol     Status: None   Collection Time: 05/16/23  5:45 PM  Result Value Ref Range   Alcohol, Ethyl (B) <10 <10 mg/dL    Comment: (NOTE) Lowest detectable limit for serum alcohol is 10 mg/dL.  For medical purposes only. Performed at [facility], [address], [place], [place] [address]   Urine rapid drug screen (hosp performed)     Status: Abnormal   Collection Time: 05/16/23  6:11 PM  Result Value Ref Range   Opiates NONE DETECTED NONE DETECTED   Cocaine NONE DETECTED NONE DETECTED   Benzodiazepines POSITIVE (A) NONE DETECTED   Amphetamines NONE DETECTED NONE DETECTED   Tetrahydrocannabinol NONE DETECTED NONE DETECTED   Barbiturates NONE DETECTED NONE DETECTED    Comment: (NOTE) DRUG SCREEN FOR MEDICAL PURPOSES ONLY.  IF CONFIRMATION IS NEEDED FOR ANY PURPOSE,  NOTIFY LAB WITHIN 5 DAYS.  LOWEST DETECTABLE LIMITS FOR URINE DRUG SCREEN Drug Class  Cutoff (ng/mL) Amphetamine and metabolites    1000 Barbiturate and metabolites    200 Benzodiazepine                 200 Opiates and metabolites        300 Cocaine and metabolites        300 THC                            50 Performed at [facility], [address] [place], [place] [address]    CT HEAD WO CONTRAST  Result Date: 05/16/2023 CLINICAL DATA:  Neuro deficit, acute, stroke suspected Headache, neuro deficit EXAM: CT HEAD WITHOUT CONTRAST TECHNIQUE: Contiguous axial images were obtained from the base of the skull through the vertex without intravenous contrast. RADIATION DOSE REDUCTION: This exam was performed according to the departmental dose-optimization program which includes automated exposure control, adjustment of the mA and/or kV according to patient size and/or use of iterative reconstruction technique. COMPARISON:  None Available. FINDINGS: Brain: No intracranial hemorrhage, mass effect, or midline shift. No hydrocephalus. The basilar cisterns are patent. Minimal periventricular white matter hypodensity is nonspecific, typically chronic small vessel ischemia. No evidence of territorial infarct or acute ischemia. No extra-axial or intracranial fluid collection. Vascular: No hyperdense vessel or unexpected calcification. Skull: Normal. Negative for fracture or focal lesion. Sinuses/Orbits: Small mucous retention cyst left maxillary sinus. Trace mucosal thickening throughout the remaining paranasal sinuses. Mastoid air cells are clear. Other: None. IMPRESSION: 1. No acute intracranial abnormality. 2. Minimal periventricular white matter hypodensity is nonspecific, typically chronic small vessel ischemia. Electronically Signed   By: Narda Rutherford M.D.   On: 05/16/2023 18:14    Pending Labs Unresulted Labs (From admission, onward)    None        Vitals/Pain Today's Vitals   05/16/23 1626 05/16/23 1627 05/16/23 1631 05/16/23 1900  BP: (!) 161/102  (!) 160/103 (!) 147/96  Pulse: 84 81 83 78  Resp:   (!) 22 18  Temp:   98.6 F (37 C)   TempSrc:   Oral   SpO2: 97% 98% 97% 96%  PainSc:        Isolation Precautions No active isolations  Medications Medications  LORazepam (ATIVAN) tablet 1 mg (has no administration in time range)  metoCLOPramide (REGLAN) injection 10 mg (has no administration in time range)    Mobility walks     Focused Assessments Cardiac Assessment Handoff:    Lab Results  Component Value Date   CKTOTAL 233 05/01/2023   Lab Results  Component Value Date   DDIMER 0.53 (H) 02/12/2023   Does the Patient currently have chest pain? No   , Neuro Assessment Handoff:  Swallow screen pass? Yes          Neuro Assessment:   Neuro Checks:      Has TPA been given? No If patient is a Neuro Trauma and patient is going to OR before floor call report to 4N Charge nurse: [phone] or [phone]  , Pulmonary Assessment Handoff:  Lung sounds:   O2 Device: Room Air      R Recommendations: See Admitting Provider Note  Report given to:   Additional Notes:  Ativan is currently held for pre MRI

## 2023-05-16 NOTE — ED Provider Notes (Addendum)
[place] EMERGENCY DEPARTMENT AT [facility] Provider Note   CSN: [id number] Arrival date & time: 05/16/23  1616     History  Chief Complaint  Patient presents with   Weakness   Numbness   Hypertension   Anxiety    Richard Sellers is a 67 y.o. male.  HPI     67 year old male comes in with chief complaint of left-sided weakness.  He has history of CAD, hypertension.  Patient states that he started having headache and feeling extremely hot early in the morning on Friday.  He has significant weakness as well and decided to just call out from work and take it easy all day on Friday.  He had dinner late last night and was feeling neurologically okay.  This morning when he woke up, he noted that he was having difficulty gripping with left upper extremity and he was dropping things.  He also noted some numbness to his left hand only.  Patient called his friend who is a Engineer, civil (consulting) and decided not to come in as soon as he found the symptoms.  After a while, he informed his friend that his numbness has worsened and decided to come to the ER.  Home Medications Prior to Admission medications   Medication Sig Start Date End Date Taking? Authorizing Provider  ALPRAZolam Prudy Feeler) 1 MG tablet Take 1 tablet (1 mg total) by mouth every 6 (six) hours as needed for anxiety. for anxiety 11/28/22   Nelwyn Salisbury, MD  aspirin 81 MG chewable tablet Chew 1 tablet (81 mg total) by mouth daily. 02/26/23   Joylene Grapes, NP  atorvastatin (LIPITOR) 20 MG tablet Take 1 tablet (20 mg total) by mouth daily. 02/26/23   Joylene Grapes, NP  carvedilol (COREG) 6.25 MG tablet Take 1 tablet (6.25 mg total) by mouth 2 (two) times daily. 05/01/23   Joylene Grapes, NP  clopidogrel (PLAVIX) 75 MG tablet Take 1 tablet (75 mg total) by mouth daily. 02/26/23   Joylene Grapes, NP  losartan (COZAAR) 100 MG tablet Take 1 tablet (100 mg total) by mouth daily. 02/26/23   Joylene Grapes, NP  ondansetron (ZOFRAN) 4 MG tablet Take 1  tablet (4 mg total) by mouth every 8 (eight) hours as needed for nausea or vomiting. 01/23/23   Karie Georges, MD  testosterone cypionate (DEPOTESTOSTERONE CYPIONATE) 200 MG/ML injection INJECT 1 ML INTRAMUSCULARLY EVERY 7 DAYS 10/15/22   Nelwyn Salisbury, MD  triamcinolone cream (KENALOG) 0.1 % Apply 1 Application topically 2 (two) times daily as needed. 09/03/22   Nelwyn Salisbury, MD      Allergies    Patient has no known allergies.    Review of Systems   Review of Systems  All other systems reviewed and are negative.   Physical Exam Updated Vital Signs BP (!) 160/103 (BP Location: Right Arm)   Pulse 83   Temp 98.6 F (37 C) (Oral)   Resp (!) 22   SpO2 97%  Physical Exam Vitals and nursing note reviewed.  Constitutional:      Appearance: He is well-developed.  HENT:     Head: Atraumatic.  Eyes:     Extraocular Movements: Extraocular movements intact.     Pupils: Pupils are equal, round, and reactive to light.  Cardiovascular:     Rate and Rhythm: Normal rate.  Pulmonary:     Effort: Pulmonary effort is normal.  Musculoskeletal:     Cervical back: Neck supple.  Skin:    General: Skin is warm.  Neurological:     Mental Status: He is alert and oriented to person, place, and time.     Comments: Patient has left upper extremity drift and also left hand numbness only  Otherwise there is no other sensory deficits, lower extremity strength is normal and there is no dysmetria.     ED Results / Procedures / Treatments   Labs (all labs ordered are listed, but only abnormal results are displayed) Labs Reviewed  CBC - Abnormal; Notable for the following components:      Result Value   RDW 15.9 (*)    All other components within normal limits  COMPREHENSIVE METABOLIC PANEL - Abnormal; Notable for the following components:   Glucose, Bld 127 (*)    Creatinine, Ser 1.64 (*)    Total Bilirubin 1.8 (*)    GFR, Estimated 46 (*)    All other components within normal limits   RAPID URINE DRUG SCREEN, HOSP PERFORMED - Abnormal; Notable for the following components:   Benzodiazepines POSITIVE (*)    All other components within normal limits  DIFFERENTIAL  ETHANOL    EKG EKG Interpretation Date/Time:  Saturday May 16 2023 16:30:11 EDT Ventricular Rate:  83 PR Interval:  188 QRS Duration:  93 QT Interval:  345 QTC Calculation: 406 R Axis:   115  Text Interpretation: Sinus rhythm Right axis deviation Low voltage, precordial leads Borderline T abnormalities, anterior leads No acute changes No significant change since last tracing Confirmed by Derwood Kaplan [phone]) on 05/16/2023 5:33:11 PM  Radiology CT HEAD WO CONTRAST  Result Date: 05/16/2023 CLINICAL DATA:  Neuro deficit, acute, stroke suspected Headache, neuro deficit EXAM: CT HEAD WITHOUT CONTRAST TECHNIQUE: Contiguous axial images were obtained from the base of the skull through the vertex without intravenous contrast. RADIATION DOSE REDUCTION: This exam was performed according to the departmental dose-optimization program which includes automated exposure control, adjustment of the mA and/or kV according to patient size and/or use of iterative reconstruction technique. COMPARISON:  None Available. FINDINGS: Brain: No intracranial hemorrhage, mass effect, or midline shift. No hydrocephalus. The basilar cisterns are patent. Minimal periventricular white matter hypodensity is nonspecific, typically chronic small vessel ischemia. No evidence of territorial infarct or acute ischemia. No extra-axial or intracranial fluid collection. Vascular: No hyperdense vessel or unexpected calcification. Skull: Normal. Negative for fracture or focal lesion. Sinuses/Orbits: Small mucous retention cyst left maxillary sinus. Trace mucosal thickening throughout the remaining paranasal sinuses. Mastoid air cells are clear. Other: None. IMPRESSION: 1. No acute intracranial abnormality. 2. Minimal periventricular white matter  hypodensity is nonspecific, typically chronic small vessel ischemia. Electronically Signed   By: Narda Rutherford M.D.   On: 05/16/2023 18:14    Procedures Procedures    Medications Ordered in ED Medications  LORazepam (ATIVAN) tablet 1 mg (has no administration in time range)    ED Course/ Medical Decision Making/ A&P                                 Medical Decision Making Amount and/or Complexity of Data Reviewed Labs: ordered. Radiology: ordered.   67 year old patient comes in with chief complaint of left upper extremity weakness and left hand numbness.  He has history of CAD, hypertension, hyperlipidemia and it appears that he is on testosterone (which may increase his risk for thrombosis).  Differential diagnosis concern for him includes acute ischemic stroke, acute hemorrhagic stroke,  acute embolic stroke and large vessel occlusion.  Other possibilities considered includes dissection, severe electrolyte abnormality.  Patient has a negative LVO screen.  We will get basic labs, CT scan of the brain.  He will need admission for stroke workup.  7:04 PM Have independently interpreted patient CT scan of the brain.  There is no brain bleed.  I am adding MRI of the brain.  We will proceed with admission request at this time for presumed stroke given clear evidence of left upper extremity drift/weakness with new dexterity issues.  No concerns for spinal cord issues.  Final Clinical Impression(s) / ED Diagnoses Final diagnoses:  Acute ischemic stroke [facility])    Rx / DC Orders ED Discharge Orders     None         Derwood Kaplan, MD 05/16/23 1745    Derwood Kaplan, MD 05/16/23 1905

## 2023-05-16 NOTE — Consult Note (Addendum)
Neurology Consultation  Reason for Consult: Stroke Referring Physician: Dr. Rhunette Croft  CC: Left sided weakness, headache  History is obtained from: Patient, chart  HPI: Richard Sellers is a 67 y.o. male past medical history of anxiety and depression, hypertension, hyperlipidemia, hypothyroidism would last known well somewhere on Friday morning or Friday night-unclear timing exactly-who is presenting for evaluation of left-sided weakness and numbness along with dropping things on the left side. Reports he woke up with a headache on Friday and did not make much of it but Saturday morning upon waking up the headache had become really bothersome.  He also started having some weakness and difficulty grasping things from the left.  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 He was evaluated in the ED.  Admitted for left-sided weakness.  MRI was completed which revealed multiple small foci of restricted diffusion in the right caudate body and frontoparietal cortex and white matter likely hyperacute and acute infarcts. Neurology was consulted. No chest pain shortness of breath.  No fevers chills.  No sick contacts Continues to complain of headache  LKW: Sometime on Friday, 05/15/2023-unclear exact time IV thrombolysis given?: no, outside the window Premorbid modified Rankin scale (mRS): 0 ROS: Full ROS was performed and is negative except as noted in the HPI.   Past Medical History:  Diagnosis Date   Allergy    seasonal allergies   Anxiety    on meds   Chronic kidney disease    hx of kidney stones   Depression    on meds   ED (erectile dysfunction)    GERD (gastroesophageal reflux disease)    not on meds-diet controlled-uses OTC meds for tx   Heart murmur    at age 19 years old, heard x1 , never again   History of kidney stones    Hyperlipidemia    on meds   Hypertension    on meds   Hypothyroidism    hx of-was on meds-   PONV (postoperative nausea and vomiting)     Warts, genital     Family History  Problem Relation Age of Onset   Hypertension Other        family hx   Hyperlipidemia Other        family hx   Colon polyps Neg Hx    Colon cancer Neg Hx    Esophageal cancer Neg Hx    Stomach cancer Neg Hx    Rectal cancer Neg Hx      Social History:   reports that he quit smoking about 20 years ago. His smoking use included cigarettes. He has never used smokeless tobacco. He reports current alcohol use of about 28.0 standard drinks of alcohol per week. He reports that he does not use drugs.  Medications  Current Facility-Administered Medications:     stroke: early stages of recovery book, , Does not apply, Once, Hugelmeyer, Alexis, DO   0.9 %  sodium chloride infusion, , Intravenous, Continuous, Hugelmeyer, Alexis, DO   acetaminophen (TYLENOL) tablet 650 mg, 650 mg, Oral, Q4H PRN **OR** acetaminophen (TYLENOL) 160 MG/5ML solution 650 mg, 650 mg, Per Tube, Q4H PRN **OR** acetaminophen (TYLENOL) suppository 650 mg, 650 mg, Rectal, Q4H PRN, Hugelmeyer, Alexis, DO   ALPRAZolam (XANAX) tablet 1 mg, 1 mg, Oral, Q6H PRN, Hugelmeyer, Alexis, DO   [START ON 05/17/2023] aspirin chewable tablet 81 mg, 81 mg, Oral, Daily, Hugelmeyer, Alexis, DO   [START ON 05/17/2023] atorvastatin (LIPITOR) tablet 20  mg, 20 mg, Oral, Daily, Hugelmeyer, Alexis, DO   [START ON 05/17/2023] clopidogrel (PLAVIX) tablet 75 mg, 75 mg, Oral, Daily, Hugelmeyer, Alexis, DO   enoxaparin (LOVENOX) injection 40 mg, 40 mg, Subcutaneous, Q24H, Hugelmeyer, Alexis, DO   senna-docusate (Senokot-S) tablet 1 tablet, 1 tablet, Oral, QHS PRN, Hugelmeyer, Alexis, DO   triamcinolone cream (KENALOG) 0.1 % cream 1 Application, 1 Application, Topical, BID PRN, Hugelmeyer, Alexis, DO   Exam: Current vital signs: BP (!) 145/87   Pulse 95   Temp 98.5 F (36.9 C) (Oral)   Resp 19   SpO2 98%  Vital signs in last 24 hours: Temp:  [98.5 F (36.9 C)-98.6 F (37 C)] 98.5 F (36.9 C) (08/24  2024) Pulse Rate:  [78-95] 95 (08/24 2024) Resp:  [18-22] 19 (08/24 2024) BP: (145-161)/(87-103) 145/87 (08/24 2024) SpO2:  [96 %-98 %] 98 % (08/24 2024) General: Awake alert in no distress HEENT: Normocephalic, atraumatic, conjunctival redness/injection bilaterally Lungs: Clear Cardiovascular: Regular rhythm Abdomen: Obese, nontender Neurological exam Awake alert oriented x 3 No evidence of dysarthria or aphasia Cranial nerves: Pupils equal round react light, extraocular movements intact, visual fields full, face symmetric, facial sensation intact, tongue and palate midline Motor examination with drift in the left upper and lower extremity.  Or EXTR. Sensation: Grossly intact Coordination with no dysmetria NIH stroke scale 1a Level of Conscious.: 0 1b LOC Questions: 0 1c LOC Commands: 0 2 Best Gaze: 0 3 Visual: 0 4 Facial Palsy: 0 5a Motor Arm - left: 1 5b Motor Arm - Right: 0 6a Motor Leg - Left: 1 6b Motor Leg - Right: 0 7 Limb Ataxia: 0 8 Sensory: 0 9 Best Language: 0 10 Dysarthria: 0 11 Extinct. and Inatten.: 0 TOTAL: 2   Labs I have reviewed labs in epic and the results pertinent to this consultation are: CBC    Component Value Date/Time   WBC 6.7 05/16/2023 1745   RBC 5.05 05/16/2023 1745   HGB 16.3 05/16/2023 1745   HCT 49.1 05/16/2023 1745   PLT 283 05/16/2023 1745   MCV 97.2 05/16/2023 1745   MCH 32.3 05/16/2023 1745   MCHC 33.2 05/16/2023 1745   RDW 15.9 (H) 05/16/2023 1745   LYMPHSABS 0.9 05/16/2023 1745   MONOABS 0.4 05/16/2023 1745   EOSABS 0.2 05/16/2023 1745   BASOSABS 0.1 05/16/2023 1745    CMP     Component Value Date/Time   NA 135 05/16/2023 1745   NA 139 05/01/2023 1702   K 4.0 05/16/2023 1745   CL 101 05/16/2023 1745   CO2 22 05/16/2023 1745   GLUCOSE 127 (H) 05/16/2023 1745   BUN 9 05/16/2023 1745   BUN 9 05/01/2023 1702   CREATININE 1.64 (H) 05/16/2023 1745   CALCIUM 9.1 05/16/2023 1745   PROT 7.1 05/16/2023 1745   PROT 7.0  05/01/2023 1702   ALBUMIN 3.7 05/16/2023 1745   ALBUMIN 4.3 05/01/2023 1702   AST 34 05/16/2023 1745   ALT 33 05/16/2023 1745   ALKPHOS 66 05/16/2023 1745   BILITOT 1.8 (H) 05/16/2023 1745   BILITOT 1.0 05/01/2023 1702   GFRNONAA 46 (L) 05/16/2023 1745   GFRAA >60 10/26/2017 2042    Lipid Panel     Component Value Date/Time   CHOL 139 05/01/2023 1702   TRIG 129 05/01/2023 1702   HDL 35 (L) 05/01/2023 1702   CHOLHDL 4.0 05/01/2023 1702   CHOLHDL 5.5 02/11/2023 0710   VLDL 17 02/11/2023 0710   LDLCALC 81 05/01/2023 1702  LDLDIRECT 138.1 09/03/2007 1118    Lab Results  Component Value Date   HGBA1C 5.2 02/11/2023      Imaging I have reviewed the images obtained:  CT-head-no acute intracranial abnormality  MRI examination of the brain-multiple foci of restricted diffusion in the right caudate body, right frontoparietal cortex and white matter likely reflecting acute infarct.  Assessment:  66 past history of anxiety depression hypertension hyperlipidemia hypothyroidism last known well sometime yesterday with sudden onset of headache followed by weakness on the left side.  On examination does have left upper and lower extremity drift in comparison to the right MRI with multiple areas of restricted diffusion in the right hemisphere. Atheroembolic versus cardioembolic etiology needs to be investigated  Impression: Acute ischemic stroke-etiology under investigation  Recommendations: -Admit to hospitalist  -Telemetry monitoring -Allow for permissive hypertension for the first 24-48h - only treat PRN if SBP >220 mmHg. Blood pressures can be gradually normalized to SBP<140 upon discharge. -CT Angiogram of Head and neck -Echocardiogram -HgbA1c, fasting lipid panel -Frequent neuro checks -Prophylactic therapy-Antiplatelet med: Aspirin 81+ Plavix 75 -Atorvastatin 80 mg PO daily -Risk factor modification -PT consult, OT consult, Speech consult  Please page stroke NP/PA/MD  (listed on AMION)  from 8am-4 pm as this patient will be followed by the stroke team at this point.  Plan relayed via secure chat to the admitting hospitalist Dr. Emmit Pomfret  -- Milon Dikes, MD Neurologist [facility] Pager: [phone]

## 2023-05-16 NOTE — H&P (Signed)
History and Physical   [facility] - [place] @ [place] [facility] Admission History and Physical [employer], D.O.    Patient Name: Jorel Nardone MR#: [id number] Date of Birth: [birth year] Date of Admission: 05/16/2023  Referring MD/NP/PA: Dr. Rhunette Croft Primary Care Physician: Nelwyn Salisbury, MD  Chief Complaint:  Chief Complaint  Patient presents with   Weakness   Numbness   Hypertension   Anxiety    HPI: Art Mcgloin is a 67 y.o. male with a known history of allergies, anxiety/depression, CKD, GERD, hypertension, hyperlipidemia, CAD status post MI, psoriasis,  presents to the emergency department for evaluation of upper extremity weakness.  Patient was in a usual state of health until 1-1/2 days prior to arrival in the emergency department he states that he woke up with a headache and with some anxiety.  He called out of work to rest thinking he might be getting sick.  Woke up this morning with headache again difficulty with his left upper extremity including weakness, decreased grip strength and some mild numbness in his left hand.  The numbness has resolved but he says persistent left arm weakness.  He did take his 81 mg aspirin today and has been compliant with his medications  At present he is also complaining of anxiety, states that he feels hot all over and has a headache  Patient denies fevers/chills, weakness, dizziness, chest pain, shortness of breath, N/V/C/D, abdominal pain, dysuria/frequency, changes in mental status.    Otherwise there has been no change in status. Patient has been taking medication as prescribed and there has been no recent change in medication or diet.  No recent antibiotics.  There has been no recent illness, hospitalizations, travel or sick contacts.    EMS/ED Course: Patient received Ativan 1 mg. Medical admission has been requested for further management of presumed CVA.  Review of Systems:  CONSTITUTIONAL: Positive headache no fever/chills,  fatigue,  weight gain/loss, headache. EYES: No blurry or double vision. ENT: No tinnitus, postnasal drip, redness or soreness of the oropharynx. RESPIRATORY: No cough, dyspnea, wheeze.  No hemoptysis.  CARDIOVASCULAR: No chest pain, palpitations, syncope, orthopnea. No lower extremity edema.  GASTROINTESTINAL: No nausea, vomiting, abdominal pain, diarrhea, constipation.  No hematemesis, melena or hematochezia. GENITOURINARY: No dysuria, frequency, hematuria. ENDOCRINE: No polyuria or nocturia. No heat or cold intolerance. HEMATOLOGY: No anemia, bruising, bleeding. INTEGUMENTARY: No rashes, ulcers, lesions. MUSCULOSKELETAL: No arthritis, gout. NEUROLOGIC: Positive left upper extremity weakness numbness.  No ataxia, seizure-type activity PSYCHIATRIC: No anxiety, depression, insomnia.   Past Medical History:  Diagnosis Date   Allergy    seasonal allergies   Anxiety    on meds   Chronic kidney disease    hx of kidney stones   Depression    on meds   ED (erectile dysfunction)    GERD (gastroesophageal reflux disease)    not on meds-diet controlled-uses OTC meds for tx   Heart murmur    at age 55 years old, heard x1 , never again   History of kidney stones    Hyperlipidemia    on meds   Hypertension    on meds   Hypothyroidism    hx of-was on meds-   PONV (postoperative nausea and vomiting)    Warts, genital     Past Surgical History:  Procedure Laterality Date   APPENDECTOMY  1975   COLONOSCOPY  11/08/2007   per Dr. Jarold Motto, diverticulosis but no polyps, repeat in 10 yrs    CYSTECTOMY  EXTRACORPOREAL SHOCK WAVE LITHOTRIPSY Left 10/29/2017   Procedure: LEFT EXTRACORPOREAL SHOCK WAVE LITHOTRIPSY (ESWL);  Surgeon: Marcine Matar, MD;  Location: [facility];  Service: Urology;  Laterality: Left;   HERNIA REPAIR  07/2017   umbilical hernia   KNEE ARTHROSCOPY Right 1993   LEFT HEART CATH AND CORONARY ANGIOGRAPHY N/A 02/12/2023   Procedure: LEFT HEART CATH AND CORONARY  ANGIOGRAPHY;  Surgeon: Swaziland, Peter M, MD;  Location: [facility] INVASIVE CV LAB;  Service: Cardiovascular;  Laterality: N/A;   TONSILLECTOMY     WISDOM TOOTH EXTRACTION  1990     reports that he quit smoking about 20 years ago. His smoking use included cigarettes. He has never used smokeless tobacco. He reports current alcohol use of about 28.0 standard drinks of alcohol per week. He reports that he does not use drugs.  No Known Allergies  Family History  Problem Relation Age of Onset   Hypertension Other        family hx   Hyperlipidemia Other        family hx   Colon polyps Neg Hx    Colon cancer Neg Hx    Esophageal cancer Neg Hx    Stomach cancer Neg Hx    Rectal cancer Neg Hx     Prior to Admission medications   Medication Sig Start Date End Date Taking? Authorizing Provider  acetaminophen (TYLENOL) 500 MG tablet Take 500 mg by mouth every 6 (six) hours as needed for moderate pain.   Yes [provider]  ALPRAZolam Prudy Feeler) 1 MG tablet Take 1 tablet (1 mg total) by mouth every 6 (six) hours as needed for anxiety. for anxiety 11/28/22  Yes Nelwyn Salisbury, MD  aspirin 81 MG chewable tablet Chew 1 tablet (81 mg total) by mouth daily. 02/26/23  Yes Monge, Petra Kuba, NP  carvedilol (COREG) 6.25 MG tablet Take 1 tablet (6.25 mg total) by mouth 2 (two) times daily. 05/01/23  Yes Monge, Petra Kuba, NP  clopidogrel (PLAVIX) 75 MG tablet Take 1 tablet (75 mg total) by mouth daily. 02/26/23  Yes Monge, Petra Kuba, NP  losartan (COZAAR) 100 MG tablet Take 1 tablet (100 mg total) by mouth daily. 02/26/23  Yes Monge, Petra Kuba, NP  testosterone cypionate (DEPOTESTOSTERONE CYPIONATE) 200 MG/ML injection INJECT 1 ML INTRAMUSCULARLY EVERY 7 DAYS 10/15/22  Yes Nelwyn Salisbury, MD  triamcinolone cream (KENALOG) 0.1 % Apply 1 Application topically 2 (two) times daily as needed. Patient taking differently: Apply 1 Application topically 2 (two) times daily as needed (itching). 09/03/22  Yes Nelwyn Salisbury, MD   atorvastatin (LIPITOR) 20 MG tablet Take 1 tablet (20 mg total) by mouth daily. 02/26/23   Joylene Grapes, NP    Physical Exam: Vitals:   05/16/23 1626 05/16/23 1627 05/16/23 1631 05/16/23 1900  BP: (!) 161/102  (!) 160/103 (!) 147/96  Pulse: 84 81 83 78  Resp:   (!) 22 18  Temp:   98.6 F (37 C)   TempSrc:   Oral   SpO2: 97% 98% 97% 96%    GENERAL: 67 y.o.-year-old white male patient, well-developed, well-nourished lying in the bed in no acute distress.  Pleasant and cooperative.  Mildly anxious HEENT: Head atraumatic, normocephalic. Pupils equal. Mucus membranes moist. NECK: Supple. No JVD.  No bruit heard CHEST: Normal breath sounds bilaterally. No wheezing, rales, rhonchi or crackles. No use of accessory muscles of respiration.  No reproducible chest wall tenderness.  CARDIOVASCULAR: S1, S2 normal. No murmurs, rubs, or gallops.  Cap refill <2 seconds. Pulses intact distally.  ABDOMEN: Soft, protuberant, nondistended, nontender. No rebound, guarding, rigidity. Normoactive bowel sounds present in all four quadrants.  EXTREMITIES: No pedal edema, cyanosis, or clubbing. No calf tenderness or Homan's sign.  NEUROLOGIC: The patient is alert and oriented x 3. Cranial nerves II through XII are grossly intact with no focal sensorimotor deficit.  Left upper extremity strength 2 out of 5.  Left lower extremity and right upper and lower extremities 5 out of 5.  Speech is intact. PSYCHIATRIC:  Normal affect, mood, thought content. SKIN: Psoriatic plaques over his anterior and posterior chest.  Cystic acne on his chest as well    Labs on Admission:  CBC: Recent Labs  Lab 05/16/23 1745  WBC 6.7  NEUTROABS 5.2  HGB 16.3  HCT 49.1  MCV 97.2  PLT 283   Basic Metabolic Panel: Recent Labs  Lab 05/16/23 1745  NA 135  K 4.0  CL 101  CO2 22  GLUCOSE 127*  BUN 9  CREATININE 1.64*  CALCIUM 9.1   GFR: CrCl cannot be calculated (Unknown ideal weight.). Liver Function Tests: Recent  Labs  Lab 05/16/23 1745  AST 34  ALT 33  ALKPHOS 66  BILITOT 1.8*  PROT 7.1  ALBUMIN 3.7   No results for input(s): "LIPASE", "AMYLASE" in the last 168 hours. No results for input(s): "AMMONIA" in the last 168 hours. Coagulation Profile: No results for input(s): "INR", "PROTIME" in the last 168 hours. Cardiac Enzymes: No results for input(s): "CKTOTAL", "CKMB", "CKMBINDEX", "TROPONINI" in the last 168 hours. BNP (last 3 results) No results for input(s): "PROBNP" in the last 8760 hours. HbA1C: No results for input(s): "HGBA1C" in the last 72 hours. CBG: No results for input(s): "GLUCAP" in the last 168 hours. Lipid Profile: No results for input(s): "CHOL", "HDL", "LDLCALC", "TRIG", "CHOLHDL", "LDLDIRECT" in the last 72 hours. Thyroid Function Tests: No results for input(s): "TSH", "T4TOTAL", "FREET4", "T3FREE", "THYROIDAB" in the last 72 hours. Anemia Panel: No results for input(s): "VITAMINB12", "FOLATE", "FERRITIN", "TIBC", "IRON", "RETICCTPCT" in the last 72 hours. Urine analysis:    Component Value Date/Time   COLORURINE YELLOW 10/26/2017 1521   APPEARANCEUR CLEAR 10/26/2017 1521   LABSPEC 1.019 10/26/2017 1521   PHURINE 5.0 10/26/2017 1521   GLUCOSEU NEGATIVE 10/26/2017 1521   HGBUR NEGATIVE 10/26/2017 1521   HGBUR negative 09/03/2007 1110   BILIRUBINUR negative 04/26/2018 1459   KETONESUR NEGATIVE 10/26/2017 1521   PROTEINUR Negative 04/26/2018 1459   PROTEINUR NEGATIVE 10/26/2017 1521   UROBILINOGEN 0.2 04/26/2018 1459   UROBILINOGEN 0.2 01/01/2010 0524   NITRITE negative 04/26/2018 1459   NITRITE NEGATIVE 10/26/2017 1521   LEUKOCYTESUR Negative 04/26/2018 1459   Sepsis Labs: @LABRCNTIP (procalcitonin:4,lacticidven:4) )No results found for this or any previous visit (from the past 240 hour(s)).   Radiological Exams on Admission: CT HEAD WO CONTRAST  Result Date: 05/16/2023 CLINICAL DATA:  Neuro deficit, acute, stroke suspected Headache, neuro deficit EXAM:  CT HEAD WITHOUT CONTRAST TECHNIQUE: Contiguous axial images were obtained from the base of the skull through the vertex without intravenous contrast. RADIATION DOSE REDUCTION: This exam was performed according to the departmental dose-optimization program which includes automated exposure control, adjustment of the mA and/or kV according to patient size and/or use of iterative reconstruction technique. COMPARISON:  None Available. FINDINGS: Brain: No intracranial hemorrhage, mass effect, or midline shift. No hydrocephalus. The basilar cisterns are patent. Minimal periventricular white matter hypodensity is nonspecific, typically chronic small vessel ischemia. No evidence of territorial infarct  or acute ischemia. No extra-axial or intracranial fluid collection. Vascular: No hyperdense vessel or unexpected calcification. Skull: Normal. Negative for fracture or focal lesion. Sinuses/Orbits: Small mucous retention cyst left maxillary sinus. Trace mucosal thickening throughout the remaining paranasal sinuses. Mastoid air cells are clear. Other: None. IMPRESSION: 1. No acute intracranial abnormality. 2. Minimal periventricular white matter hypodensity is nonspecific, typically chronic small vessel ischemia. Electronically Signed   By: Narda Rutherford M.D.   On: 05/16/2023 18:14    EKG: Normal sinus rhythm at 83 bpm with normal axis and nonspecific ST-T wave changes.   Assessment/Plan  This is a 67 y.o. male with a history of allergies, anxiety/depression, CKD, GERD, hypertension, hyperlipidemia, CAD status post MI now being admitted with:  #.  Left upper extremity weakness consistent with CVA - Admit telemetry observation for neuro workup including: - Studies: MRI, Echo, Carotids - Labs: CBC, BMP, Lipids, TFTs, A1C - Nursing: Neurochecks, O2, dysphagia screen, permissive hypertension.  - Consults: Neurology, PT/OT, S/S consults.  - Meds: Daily aspirin 81mg , Plavix 75 mg   #.  History of anxiety  depression -Continue alprazolam  #.  History of CKD, near baseline - Monitor BMP  #. History of psoriasis Continue triamcinolone cream  #. History of hypertension -Hold antihypertensives for now -carvedilol and losartan  #. History of hyperlipidemia - Continue atorvastatin  #.  History of erectile dysfunction - Hold testosterone  Admission status: Telemetry abs IV Fluids: NS Diet/Nutrition: N.p.o. Consults called: Neuro, PT, speech-language DVT Px: Lovenox, SCDs and early ambulation. Code Status: Full Code  Disposition Plan: To home in to be determined  All the records are reviewed and case discussed with ED provider. Management plans discussed with the patient and/or family who express understanding and agree with plan of care.  [employer] D.O. on 05/16/2023 at 8:16 PM CC: Primary care physician; Nelwyn Salisbury, MD   05/16/2023, 8:16 PM

## 2023-05-16 NOTE — ED Notes (Signed)
[facility] notified and transport requested.

## 2023-05-17 ENCOUNTER — Observation Stay (HOSPITAL_COMMUNITY): Payer: Medicare Other

## 2023-05-17 ENCOUNTER — Encounter (HOSPITAL_COMMUNITY): Payer: Self-pay | Admitting: Family Medicine

## 2023-05-17 DIAGNOSIS — I639 Cerebral infarction, unspecified: Secondary | ICD-10-CM | POA: Diagnosis not present

## 2023-05-17 DIAGNOSIS — R531 Weakness: Secondary | ICD-10-CM | POA: Diagnosis not present

## 2023-05-17 DIAGNOSIS — R200 Anesthesia of skin: Secondary | ICD-10-CM | POA: Diagnosis not present

## 2023-05-17 DIAGNOSIS — I6389 Other cerebral infarction: Secondary | ICD-10-CM

## 2023-05-17 DIAGNOSIS — I6529 Occlusion and stenosis of unspecified carotid artery: Secondary | ICD-10-CM | POA: Diagnosis not present

## 2023-05-17 DIAGNOSIS — I6503 Occlusion and stenosis of bilateral vertebral arteries: Secondary | ICD-10-CM | POA: Diagnosis not present

## 2023-05-17 DIAGNOSIS — R9389 Abnormal findings on diagnostic imaging of other specified body structures: Secondary | ICD-10-CM | POA: Diagnosis not present

## 2023-05-17 DIAGNOSIS — G459 Transient cerebral ischemic attack, unspecified: Secondary | ICD-10-CM | POA: Diagnosis not present

## 2023-05-17 DIAGNOSIS — I517 Cardiomegaly: Secondary | ICD-10-CM | POA: Diagnosis not present

## 2023-05-17 DIAGNOSIS — I700 Atherosclerosis of aorta: Secondary | ICD-10-CM | POA: Diagnosis not present

## 2023-05-17 LAB — LIPID PANEL
Cholesterol: 174 mg/dL (ref 0–200)
HDL: 28 mg/dL — ABNORMAL LOW (ref >40)
LDL Cholesterol: 116 mg/dL — ABNORMAL HIGH (ref 0–99)
Total CHOL/HDL Ratio: 6.2 ratio
Triglycerides: 152 mg/dL — ABNORMAL HIGH (ref <150)
VLDL: 30 mg/dL (ref 0–40)

## 2023-05-17 LAB — ECHOCARDIOGRAM COMPLETE
Area-P 1/2: 2.83 cm2
Height: 68 in
S' Lateral: 3.5 cm
Weight: 4497.38 [oz_av]

## 2023-05-17 LAB — CBC
HCT: 47.9 % (ref 39.0–52.0)
Hemoglobin: 15.6 g/dL (ref 13.0–17.0)
MCH: 31.2 pg (ref 26.0–34.0)
MCHC: 32.6 g/dL (ref 30.0–36.0)
MCV: 95.8 fL (ref 80.0–100.0)
Platelets: 279 10*3/uL (ref 150–400)
RBC: 5 MIL/uL (ref 4.22–5.81)
RDW: 15.9 % — ABNORMAL HIGH (ref 11.5–15.5)
WBC: 7.5 10*3/uL (ref 4.0–10.5)
nRBC: 0 % (ref 0.0–0.2)

## 2023-05-17 LAB — URINALYSIS, DIPSTICK ONLY
Bilirubin Urine: NEGATIVE
Glucose, UA: NEGATIVE mg/dL
Hgb urine dipstick: NEGATIVE
Ketones, ur: NEGATIVE mg/dL
Leukocytes,Ua: NEGATIVE
Nitrite: NEGATIVE
Protein, ur: NEGATIVE mg/dL
Specific Gravity, Urine: 1.017 (ref 1.005–1.030)
pH: 6 (ref 5.0–8.0)

## 2023-05-17 LAB — CREATININE, SERUM
Creatinine, Ser: 1.54 mg/dL — ABNORMAL HIGH (ref 0.61–1.24)
GFR, Estimated: 49 mL/min — ABNORMAL LOW (ref >60)

## 2023-05-17 LAB — TSH: TSH: 4.577 u[IU]/mL — ABNORMAL HIGH (ref 0.350–4.500)

## 2023-05-17 LAB — HEMOGLOBIN A1C
Hgb A1c MFr Bld: 5.9 % — ABNORMAL HIGH (ref 4.8–5.6)
Mean Plasma Glucose: 122.63 mg/dL

## 2023-05-17 MED ORDER — ATORVASTATIN CALCIUM 80 MG PO TABS: 80.0000 mg | ORAL_TABLET | Freq: Every day | ORAL | 2 refills | Status: DC

## 2023-05-17 MED ORDER — IOHEXOL 350 MG/ML SOLN
75.0000 mL | Freq: Once | INTRAVENOUS | Status: AC | PRN
  Administered 2023-05-17: 75 mL via INTRAVENOUS

## 2023-05-17 NOTE — Progress Notes (Signed)
Patient discharged home today per MD orders. Discharge Instructions including follow up appointments, medications, and education reviewed with patient. Patient verbalizes understanding. Patient requested work note, which was provided to him. Patient also stated that he did not have a ride and asked for assistance, cab voucher provided from CM to patient.

## 2023-05-17 NOTE — Evaluation (Signed)
Speech Language Pathology Evaluation Patient Details Name: Richard Sellers MRN: [id number] DOB: Nov 15, 1955 Today's Date: 05/17/2023 Time: 9562-1308 SLP Time Calculation (min) (ACUTE ONLY): 15 min  Problem List:  Patient Active Problem List   Diagnosis Date Noted   Acute ischemic stroke (HCC) 05/16/2023   NSTEMI (non-ST elevated myocardial infarction) (HCC) 02/11/2023   AKI (acute kidney injury) (HCC) 02/11/2023   Environmental and seasonal allergies 02/18/2022   Depression with anxiety 11/28/2020   Arthritis 04/06/2020   Hypogonadism in male 09/01/2018   Bilateral leg edema 02/17/2017   Hypothyroidism 11/26/2016   GERD 05/30/2010   PSORIASIS 05/30/2010   Essential hypertension 09/25/2008   Past Medical History:  Past Medical History:  Diagnosis Date   Allergy    seasonal allergies   Anxiety    on meds   Chronic kidney disease    hx of kidney stones   Depression    on meds   ED (erectile dysfunction)    GERD (gastroesophageal reflux disease)    not on meds-diet controlled-uses OTC meds for tx   Heart murmur    at age 31 years old, heard x1 , never again   History of kidney stones    Hyperlipidemia    on meds   Hypertension    on meds   Hypothyroidism    hx of-was on meds-   PONV (postoperative nausea and vomiting)    Warts, genital    Past Surgical History:  Past Surgical History:  Procedure Laterality Date   APPENDECTOMY  1975   COLONOSCOPY  11/08/2007   per Dr. Jarold Motto, diverticulosis but no polyps, repeat in 10 yrs    CYSTECTOMY     EXTRACORPOREAL SHOCK WAVE LITHOTRIPSY Left 10/29/2017   Procedure: LEFT EXTRACORPOREAL SHOCK WAVE LITHOTRIPSY (ESWL);  Surgeon: Marcine Matar, MD;  Location: [facility];  Service: Urology;  Laterality: Left;   HERNIA REPAIR  07/2017   umbilical hernia   KNEE ARTHROSCOPY Right 1993   LEFT HEART CATH AND CORONARY ANGIOGRAPHY N/A 02/12/2023   Procedure: LEFT HEART CATH AND CORONARY ANGIOGRAPHY;  Surgeon: [place]azil[place], Peter M, MD;   Location[facility] INVASIVE CV LAB;  Service: Cardiovascular;  Laterality: N/A;   TONSILLECTOMY     WISDOM TOOTH EXTRACTION  1990   HPI:  Richard Sellers is a 67 y.o. male with a known history of allergies, anxiety/depression, CKD, GERD, hypertension, hyperlipidemia, CAD status post MI, psoriasis,  presents to the emergency department for evaluation of upper extremity weakness.  Patient was in a usual state of health until 1-1/2 days prior to arrival in the emergency department he states that he woke up with a headache and with some anxiety.  He called out of work to rest thinking he might be getting sick.  Woke up this morning with headache again difficulty with his left upper extremity including weakness, decreased grip strength and some mild numbness in his left hand.  The numbness has resolved but he says persistent left arm weakness.  He did take his 81 mg aspirin today and has been compliant with his medications.  MRI of the head was showing:  multiple small foci of restricted diffusion in the right caudate body and frontoparietal cortex and white matter, likely hyperacute and acute infarcts.   Assessment / Plan / Recommendation Clinical Impression  Cognitive/linguistic evaluation and motor speech screen were completed.  Cranial nerve exam was completed and unremarkable. Lingual, labial, facial and jaw range of motion and strength appeared to be adequate.  Facial sensation appeared to be  intact and he did no endorse a difference in sensation between the right and left side of his face.  Speech was clear and easy to understand.  No discernible dyarthria or apraxia were noted.  The Mini Mental State Exam was completed.  He achieved an overall score of 25/30.  He was fully oriented to person, place, time and situation.  He had good immediate and delayed recall of 3 novel words.  He struggled to complete the attention task and reported he would have struggled with this before the stroke.  Language skills  appeared to be grossly intact.  He was able to name objects, repeat a sentence, follow a 3 step command, read/comprehend a sentence and write a sentence. Mild issues were noted for the copying task.  Speech was fluent as he easily described reason for admission.   Informally, he was able to provide logical solutions to simple problems.  Mild issues were noted for the clock drawing task. He was able to place the circle on the paper and place all the numbers in their correct spot. Mild spacing issues of the numbers was noted and the hands were not placed on the clock face at the directed time (ie 1050 instead of 1110).  Patient feels he is at baseline.  Given this ST will not follow. If we can be of further assistance please feel free to reconsult.    SLP Assessment  SLP Recommendation/Assessment: Patient does not need any further Speech Lanaguage Pathology Services SLP Visit Diagnosis: Cognitive communication deficit (R41.841)    Recommendations for follow up therapy are one component of a multi-disciplinary discharge planning process, led by the attending physician.  Recommendations may be updated based on patient status, additional functional criteria and insurance authorization.    Follow Up Recommendations  No SLP follow up    Assistance Recommended at Discharge  None           SLP Evaluation Cognition  Overall Cognitive Status: Within Functional Limits for tasks assessed Arousal/Alertness: Awake/alert Orientation Level: Oriented X4 Memory: Appears intact Problem Solving: Appears intact Safety/Judgment: Appears intact       Comprehension  Auditory Comprehension Overall Auditory Comprehension: Appears within functional limits for tasks assessed Yes/No Questions: Not tested Commands: Within Functional Limits Conversation: Simple Reading Comprehension Reading Status: Within funtional limits    Expression Expression Primary Mode of Expression: Verbal Verbal Expression Overall  Verbal Expression: Appears within functional limits for tasks assessed Initiation: No impairment Automatic Speech: Name;Social Response Level of Generative/Spontaneous Verbalization: Conversation Repetition: No impairment Naming: No impairment Pragmatics: No impairment Non-Verbal Means of Communication: Not applicable Written Expression Dominant Hand: Right Written Expression: Within Functional Limits   Oral / Motor  Oral Motor/Sensory Function Overall Oral Motor/Sensory Function: Within functional limits Motor Speech Overall Motor Speech: Appears within functional limits for tasks assessed Respiration: Within functional limits Phonation: Normal Articulation: Within functional limitis Intelligibility: Intelligible Motor Speech Errors: Not applicable           Dimas Aguas, MA, CCC-SLP Acute Rehab SLP [phone]  Fleet Contras 05/17/2023, 2:25 PM

## 2023-05-17 NOTE — TOC Transition Note (Signed)
Transition of Care [facility]) - CM/SW Discharge Note   Patient Details  Name: Richard Sellers MRN: [id number] Date of Birth: [birth year]  [facility] CM/SW Contact:  Lawerance Sabal, RN Phone Number: 05/17/2023, 4:04 PM   Clinical Narrative:      Referral made to [facility] Neuro for OT        Patient Goals and CMS Choice      Discharge Placement                         Discharge Plan and Services Additional resources added to the After Visit Summary for                                       Social Determinants of Health (SDOH) Interventions SDOH Screenings   Food Insecurity: No Food Insecurity (05/16/2023)  Housing: Low Risk  (05/16/2023)  Transportation Needs: No Transportation Needs (05/16/2023)  Utilities: Not At Risk (05/16/2023)  Depression (PHQ2-9): High Risk (01/23/2023)  Social Connections: Unknown (02/04/2022)   Received from [facility]  Tobacco Use: Medium Risk (05/17/2023)     Readmission Risk Interventions    02/13/2023    2:29 PM  Readmission Risk Prevention Plan  Post Dischage Appt Complete  Medication Screening Complete  Transportation Screening Complete

## 2023-05-17 NOTE — Progress Notes (Addendum)
STROKE TEAM PROGRESS NOTE   BRIEF HPI [name] is a 67 y.o. male with history of anxiety and depression, hypertension, hyperlipidemia, hypothyroidism would last known well somewhere on Friday morning or Friday night-unclear timing exactly-who is presenting for evaluation of left-sided weakness and numbness along with dropping things on the left side. Reports he woke up with a headache on Friday and did not make much of it but Saturday morning upon waking up the headache had become really bothersome.  He also started having some weakness and difficulty grasping things from the left.  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 He was evaluated in the ED.  Admitted for left-sided weakness.  MRI was completed which revealed multiple small foci of restricted diffusion in the right caudate body and frontoparietal cortex and white matter likely hyperacute and acute infarcts   INTERIM HISTORY/SUBJECTIVE - needs 30 day monitor - message sent to his [facility] team. He states he had cardiac done a few months ago- records reviewed. He is currently on DAPT- plan to continue unless a need for anticogaulation is found. MRI concerning for cardio embolic source recommend outpatient 30 day monitor  OBJECTIVE  CBC    Component Value Date/Time   WBC 7.5 05/17/2023 0452   RBC 5.00 05/17/2023 0452   HGB 15.6 05/17/2023 0452   HCT 47.9 05/17/2023 0452   PLT 279 05/17/2023 0452   MCV 95.8 05/17/2023 0452   MCH 31.2 05/17/2023 0452   MCHC 32.6 05/17/2023 0452   RDW 15.9 (H) 05/17/2023 0452   LYMPHSABS 0.9 05/16/2023 1745   MONOABS 0.4 05/16/2023 1745   EOSABS 0.2 05/16/2023 1745   BASOSABS 0.1 05/16/2023 1745    BMET    Component Value Date/Time   NA 135 05/16/2023 1745   NA 139 05/01/2023 1702   K 4.0 05/16/2023 1745   CL 101 05/16/2023 1745   CO2 22 05/16/2023 1745   GLUCOSE 127 (H) 05/16/2023 1745   BUN 9 05/16/2023 1745   BUN 9 05/01/2023 1702   CREATININE 1.54 (H)  05/17/2023 0452   CALCIUM 9.1 05/16/2023 1745   EGFR 56 (L) 05/01/2023 1702   GFRNONAA 49 (L) 05/17/2023 0452    IMAGING past 24 hours DG Chest 2 View  Result Date: 05/17/2023 CLINICAL DATA:  67 year old male with history of transient ischemic attack. EXAM: CHEST - 2 VIEW COMPARISON:  Chest x-ray 02/11/2023. FINDINGS: Lung volumes are normal. Mild linear scarring left mid lung, unchanged. No acute consolidative airspace disease. No pleural effusions. Mild chronic elevation of the left hemidiaphragm again noted. No pneumothorax. No evidence of pulmonary edema. Heart size is borderline enlarged. Upper mediastinal contours are within normal limits. Atherosclerotic calcifications are noted in the thoracic aorta. IMPRESSION: 1. No radiographic evidence of acute cardiopulmonary disease. 2. Aortic atherosclerosis. Electronically Signed   By: Trudie Reed M.D.   On: 05/17/2023 07:47   CT ANGIO HEAD NECK W WO CM  Result Date: 05/17/2023 CLINICAL DATA:  Left-sided weakness and numbness, acute infarcts on MRI EXAM: CT ANGIOGRAPHY HEAD AND NECK WITH AND WITHOUT CONTRAST TECHNIQUE: Multidetector CT imaging of the head and neck was performed using the standard protocol during bolus administration of intravenous contrast. Multiplanar CT image reconstructions and MIPs were obtained to evaluate the vascular anatomy. Carotid stenosis measurements (when applicable) are obtained utilizing NASCET criteria, using the distal internal carotid diameter as the denominator. RADIATION DOSE REDUCTION: This exam was performed according to the departmental dose-optimization  program which includes automated exposure control, adjustment of the mA and/or kV according to patient size and/or use of iterative reconstruction technique. CONTRAST:  75mL OMNIPAQUE IOHEXOL 350 MG/ML SOLN COMPARISON:  No prior CTA available, correlation is made with 05/16/2023 CT head and MRI head FINDINGS: CT HEAD FINDINGS For noncontrast findings, please  see 05/16/2023 CT head. CTA NECK FINDINGS Aortic arch: Standard branching. Imaged portion shows no evidence of aneurysm or dissection. No significant stenosis of the major arch vessel origins. Aortic atherosclerosis. Right carotid system: No evidence of dissection, occlusion, or hemodynamically significant stenosis (greater than 50%). Atherosclerotic disease at the bifurcation and in the proximal ICA is not hemodynamically significant. Left carotid system: No evidence of dissection, occlusion, or hemodynamically significant stenosis (greater than 50%). 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 Multifocal mild stenosis in the right V3 segment. Moderate stenosis of the left vertebral artery at its origin. The left vertebral artery is otherwise patent to the skull base, without significant stenosis. Skeleton: No acute osseous abnormality. Degenerative changes in the cervical spine. Other neck: No acute finding. Upper chest: No focal pulmonary opacity or pleural effusion. Review of the MIP images confirms the above findings CTA HEAD FINDINGS Anterior circulation: Both internal carotid arteries are patent to the termini, without significant stenosis. A1 segments patent. Normal anterior communicating artery. Anterior cerebral arteries are patent to their distal aspects without significant stenosis. No M1 stenosis or occlusion. MCA branches perfused to their distal aspects without significant stenosis. Posterior circulation: Multifocal moderate to severe stenosis in the right vertebral artery. Mild stenosis in the left vertebral artery. The right PICA is patent proximally. There is likely a common origin of the left AICA and left PICA. Basilar patent to its distal aspect without significant stenosis. Superior  cerebellar arteries patent on the left. The right superior cerebellar artery is very poorly opacified proximally (series 5, images 121-122). Patent left P1. Suspect a hypoplastic or severely stenosed right P1. Near fetal origin of the bilateral PCAs from prominent posterior communicating arteries. PCAs perfused to their distal aspects without significant stenosis. Venous sinuses: As permitted by contrast timing, patent. Anatomic variants: Near fetal origin the bilateral PCAs. Review of the MIP images confirms the above findings IMPRESSION: 1. 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 Multifocal moderate to severe stenosis in the right V4 segment. 2. The right superior cerebellar artery is very poorly opacified proximally. 3. Moderate stenosis at the origin of the left vertebral artery and mild stenosis in the left V4 segment. 4. No other intracranial large vessel occlusion. 5. No hemodynamically significant stenosis in the neck. 6. Aortic atherosclerosis. Aortic Atherosclerosis (ICD10-I70.0). Electronically Signed   By: Wiliam Ke M.D.   On: 05/17/2023 00:42   MR BRAIN WO CONTRAST  Result Date: 05/16/2023 CLINICAL DATA:  Headache, stroke suspected EXAM: MRI HEAD WITHOUT CONTRAST TECHNIQUE: Multiplanar, multiecho pulse sequences of the brain and surrounding structures were obtained without intravenous contrast. COMPARISON:  None Available. FINDINGS: Brain: Multiple small foci of restricted diffusion with ADC correlates in the right caudate body (series 5, image 79) an frontoparietal cortex and white matter (series 5, images 81-94). Some  of these areas correlate with increased T2 hyperintense signal (for example series 11, image 31 and 42) while the more anterior areas do not yet have T2 correlates), likely hyperacute  and acute infarcts. No acute hemorrhage, mass, mass effect, or midline shift. No hydrocephalus or extra-axial collection. No hemosiderin deposition to suggest remote hemorrhage. Vascular: Normal arterial flow voids. Skull and upper cervical spine: Normal marrow signal. Sinuses/Orbits: Clear paranasal sinuses. No acute finding in the orbits. Other: The mastoids are well aerated. IMPRESSION: Multiple small foci of restricted diffusion in the right caudate body and frontoparietal cortex and white matter, likely hyperacute and acute infarcts. These results will be called to the ordering clinician or representative by the Radiologist Assistant, and communication documented in the PACS or [employer]. Electronically Signed   By: Wiliam Ke M.D.   On: 05/16/2023 21:36   CT HEAD WO CONTRAST  Result Date: 05/16/2023 CLINICAL DATA:  Neuro deficit, acute, stroke suspected Headache, neuro deficit EXAM: CT HEAD WITHOUT CONTRAST TECHNIQUE: Contiguous axial images were obtained from the base of the skull through the vertex without intravenous contrast. RADIATION DOSE REDUCTION: This exam was performed according to the departmental dose-optimization program which includes automated exposure control, adjustment of the mA and/or kV according to patient size and/or use of iterative reconstruction technique. COMPARISON:  None Available. FINDINGS: Brain: No intracranial hemorrhage, mass effect, or midline shift. No hydrocephalus. The basilar cisterns are patent. Minimal periventricular white matter hypodensity is nonspecific, typically chronic small vessel ischemia. No evidence of territorial infarct or acute ischemia. No extra-axial or intracranial fluid collection. Vascular: No hyperdense vessel or unexpected calcification. Skull: Normal. Negative for fracture or focal lesion. Sinuses/Orbits: Small mucous retention cyst left maxillary sinus. Trace mucosal thickening throughout the remaining paranasal sinuses. Mastoid air  cells are clear. Other: None. IMPRESSION: 1. No acute intracranial abnormality. 2. Minimal periventricular white matter hypodensity is nonspecific, typically chronic small vessel ischemia. Electronically Signed   By: Narda Rutherford M.D.   On: 05/16/2023 18:14    Vitals:   05/16/23 2237 05/16/23 2338 05/17/23 0338 05/17/23 0816  BP: (!) 160/101 (!) 166/106 (!) 149/103 127/86  Pulse:  (!) 102 84 70  Resp: 18 18 19 20   Temp: 98.6 F (37 C) 98.1 F (36.7 C) 98.2 F (36.8 C) 97.6 F (36.4 C)  TempSrc: Oral Oral Oral Oral  SpO2: 95% 95% 97% 95%  Weight:      Height:         PHYSICAL EXAM General:  Alert, well-nourished, well-developed patient in no acute distress Psych:  Mood and affect appropriate for situation CV: Regular rate and rhythm on monitor Respiratory:  Regular, unlabored respirations on room air GI: Abdomen soft and nontender   NEURO:  Mental Status: AA&Ox3, patient is able to give clear and coherent history Speech/Language: speech is without dysarthria or aphasia.  Naming, repetition, fluency, and comprehension intact.  Cranial Nerves:  II: PERRL. Visual fields full.  III, IV, VI: EOMI. Eyelids elevate symmetrically.  V: Sensation is intact to light touch and symmetrical to face.  VII: Face is symmetrical resting and smiling VIII: hearing intact to voice. IX, X: Palate elevates symmetrically. Phonation is normal.  SW:NIOEVOJJ shrug 5/5. XII: tongue is midline without fasciculations. Motor: 5/5 strength to right upper and lower extremity. Increased effort to elevate left upper and lower, but no drift on exam today Tone: is normal and bulk is normal Sensation- Intact to light touch bilaterally. Extinction absent to light touch to DSS.   Coordination:  FTN intact bilaterally, HKS: no ataxia in BLE.No drift.  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 Small vessel disease. Atrophy. ASPECTS 10.    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 Multifocal moderate to severe stenosis in the right V4 segment. The right superior cerebellar artery is very poorly opacified proximally. Moderate stenosis at the origin of the left vertebral artery and mild stenosis in the left V4 segment. No other intracranial large vessel occlusion. No hemodynamically significant stenosis in the neck. MRI  Multiple small foci of restricted diffusion in the right caudate body and frontoparietal cortex and white matter, likely hyperacute and acute infarcts. 2D Echo EF 55-60%, mildly dilated bilateral atria LDL 116 HgbA1c 5.9 VTE prophylaxis - Lovenox aspirin 81 mg daily and clopidogrel 75 mg daily prior to admission, now on aspirin 81 mg daily and clopidogrel 75 mg daily  Therapy recommendations:  No follow up needed Disposition:  Home   Hypertension Home meds:  Coreg 6.25mg , Losartan 100mg  Stable Blood Pressure Goal: BP less than 220/110   Hyperlipidemia Home meds:  Atorvastatin 80mg , resumed in hospital LDL 116, goal < 70 Continue statin at discharge  Diabetes type II Controlled HgbA1c 5.9, goal < 7.0 CBGs SSI Recommend close follow-up with PCP for better DM control   Other Stroke Risk Factors Coronary artery disease On ASA and plavix her [facility] (follows with Dr. Servando Salina)  Other Active Problems   Hospital day # 0   Patient seen and examined by NP/APP with MD. MD to update note as needed.   Elmer Picker, DNP, FNP-BC [facility] Pager: [phone]  ATTENDING NOTE: I reviewed above note and agree with the assessment and plan. Pt was seen and examined.  I spent a  total of 35 minutes dedicated to the care of this patient. For detailed assessment and plan, please refer to above/below as I have made changes wherever appropriate.    Windell Norfolk, MD  Stroke Neurology 05/17/2023 6:30 PM    To contact Stroke Continuity provider, please refer to [email or web address]. After hours, contact General Neurology

## 2023-05-17 NOTE — Discharge Summary (Signed)
Physician Discharge Summary  Richard Sellers OAC:[id number] DOB: [birth year] DOA: 05/16/2023  PCP: Nelwyn Salisbury, MD  Admit date: 05/16/2023 Discharge date: 05/18/2023  Admitted From: Home  Discharge disposition: Home   Recommendations for Outpatient Follow-Up:   Follow up with your primary care provider in one week.  Check CBC, BMP, magnesium in the next visit Follow-up with [facility] in 4 to 6 weeks. Patient has been advised 30-day event monitor on discharge.  This will be arranged as outpatient.  Discharge Diagnosis:   Principal Problem:   Acute ischemic stroke [facility])   Discharge Condition: Improved.  Diet recommendation: Low sodium, heart healthy.    Wound care: None.  Code status: Full.   History of Present Illness:   Richard Sellers is a 67 y.o. male with a known history of allergies, anxiety/depression, CKD, GERD, hypertension, hyperlipidemia, CAD status post MI, psoriasis,  presented to the emergency department for evaluation of upper extremity weakness preceded by headache and with some anxiety, difficulty with his left upper extremity including weakness, decreased grip strength and some mild numbness in his left hand.   Hospital Course:   Following conditions were addressed during hospitalization as listed below,  Left upper extremity weakness consistent with CVA -MRI of the brain showed small foci of restricted diffusion in the right carotid body and frontoparietal cortex.  CT head showed periventricular white matter hypodensity.  CTA of the head and neck with severe stenosis at the origin of right vertebral artery.   2D echocardiogram with LV ejection fraction of 55 to 60% with LVH and grade 1 diastolic dysfunction.  Lipid panel with LDL of 116.  Continue aspirin, Plavix and Lipitor.  PT OT recommend no special therapy needs.    Hemoglobin A1c of 5.9.  TSH of 4.5.  Communicated with neurology who recommend 30-day event monitor on discharge.   Neurology to communicate with cardiology for event monitoring set up.   History of anxiety depression Continue Xanax from home on discharge.   History of CKD, near baseline Latest creatinine at 1.5.  Will need outpatient follow-up.   History of psoriasis Continue triamcinolone cream   Essential hypertension Patient will resume carvedilol and losartan on discharge.   Hyperlipidemia Continue atorvastatin   History of erectile dysfunction On testosterone at home.  Disposition.  At this time, patient is stable for disposition home with outpatient PCP and neurology follow-up.  Medical Consultants:   Neurology  Procedures:    None Subjective:   Today, patient was seen and examined at bedside.  Denies any dizziness lightheadedness. States that his headache nausea and weakness better.  Discharge Exam:   Vitals:   05/17/23 1223 05/17/23 1556  BP: (!) 146/97 (!) 155/106  Pulse: 68 91  Resp: 18 18  Temp: 98.3 F (36.8 C) 98.4 F (36.9 C)  SpO2: 96% 96%   Vitals:   05/17/23 0338 05/17/23 0816 05/17/23 1223 05/17/23 1556  BP: (!) 149/103 127/86 (!) 146/97 (!) 155/106  Pulse: 84 70 68 91  Resp: 19 20 18 18   Temp: 98.2 F (36.8 C) 97.6 F (36.4 C) 98.3 F (36.8 C) 98.4 F (36.9 C)  TempSrc: Oral Oral Oral Oral  SpO2: 97% 95% 96% 96%  Weight:      Height:       Body mass index is 42.74 kg/m.   General: Alert awake, not in obvious distress, morbidly obese HENT: pupils equally reacting to light,  No scleral pallor or icterus noted. Oral mucosa is moist.  Chest:  Clear breath sounds.   No crackles or wheezes.  CVS: S1 &S2 heard. No murmur.  Regular rate and rhythm. Abdomen: Soft, nontender, nondistended.  Bowel sounds are heard.   Extremities: No cyanosis, clubbing or edema.  Peripheral pulses are palpable. Psych: Alert, awake and oriented, normal mood CNS:  No cranial nerve deficits.  Left upper extremity grip weakness. Skin: Warm and dry.  No rashes  noted.  The results of significant diagnostics from this hospitalization (including imaging, microbiology, ancillary and laboratory) are listed below for reference.     Diagnostic Studies:   ECHOCARDIOGRAM COMPLETE  Result Date: 05/17/2023    ECHOCARDIOGRAM REPORT   Patient Name:   Richard Sellers Date of Exam: 05/17/2023 Medical Rec #:  [id number]        Height:       68.0 in Accession #:    [id number]       Weight:       281.1 lb Date of Birth:  [birth year]       BSA:          2.362 m Patient Age:    29 years         BP:           127/86 mmHg Patient Gender: M                HR:           70 bpm. Exam Location:  Inpatient Procedure: 2D Echo, Cardiac Doppler and Color Doppler Indications:    stroke  History:        Patient has prior history of Echocardiogram examinations, most                 recent 02/11/2023. Signs/Symptoms:Edema; Risk Factors:Current                 Smoker and Hypertension.  Sonographer:    Delcie Roch RDCS Referring Phys: [id number] ALEXIS HUGELMEYER  Sonographer Comments: Patient is obese. Image acquisition challenging due to patient body habitus. IMPRESSIONS  1. Left ventricular ejection fraction, by estimation, is 55 to 60%. Left ventricular ejection fraction by PLAX is 57 %. The left ventricle has normal function. The left ventricle has no regional wall motion abnormalities. There is mild left ventricular hypertrophy. Left ventricular diastolic parameters are consistent with Grade I diastolic dysfunction (impaired relaxation).  2. Right ventricular systolic function is normal. The right ventricular size is normal. Tricuspid regurgitation signal is inadequate for assessing PA pressure.  3. Left atrial size was mildly dilated.  4. Right atrial size was mildly dilated.  5. The mitral valve is grossly normal. No evidence of mitral valve regurgitation.  6. The aortic valve is tricuspid. Aortic valve regurgitation is not visualized.  7. Aortic dilatation noted. There is mild dilatation  of the ascending aorta, measuring 42 mm.  8. The inferior vena cava is normal in size with greater than 50% respiratory variability, suggesting right atrial pressure of 3 mmHg. Comparison(s): No significant change from prior study. 02/11/2023: LVEF 55-60%, mild biatrial enlargement. FINDINGS  Left Ventricle: Left ventricular ejection fraction, by estimation, is 55 to 60%. Left ventricular ejection fraction by PLAX is 57 %. The left ventricle has normal function. The left ventricle has no regional wall motion abnormalities. The left ventricular internal cavity size was normal in size. There is mild left ventricular hypertrophy. Left ventricular diastolic parameters are consistent with Grade I diastolic dysfunction (impaired relaxation). Indeterminate filling pressures. Right Ventricle: The right ventricular  size is normal. No increase in right ventricular wall thickness. Right ventricular systolic function is normal. Tricuspid regurgitation signal is inadequate for assessing PA pressure. Left Atrium: Left atrial size was mildly dilated. Right Atrium: Right atrial size was mildly dilated. Pericardium: There is no evidence of pericardial effusion. Mitral Valve: The mitral valve is grossly normal. No evidence of mitral valve regurgitation. Tricuspid Valve: The tricuspid valve is normal in structure. Tricuspid valve regurgitation is not demonstrated. Aortic Valve: The aortic valve is tricuspid. Aortic valve regurgitation is not visualized. Pulmonic Valve: The pulmonic valve was normal in structure. Pulmonic valve regurgitation is not visualized. Aorta: Aortic dilatation noted. There is mild dilatation of the ascending aorta, measuring 42 mm. Venous: The inferior vena cava is normal in size with greater than 50% respiratory variability, suggesting right atrial pressure of 3 mmHg. IAS/Shunts: No atrial level shunt detected by color flow Doppler.  LEFT VENTRICLE PLAX 2D LV EF:         Left            Diastology                 ventricular     LV e' medial:    8.38 cm/s                ejection        LV E/e' medial:  9.8                fraction by     LV e' lateral:   7.18 cm/s                PLAX is 57      LV E/e' lateral: 11.4                %. LVIDd:         5.00 cm LVIDs:         3.50 cm LV PW:         1.10 cm LV IVS:        1.10 cm LVOT diam:     1.90 cm LV SV:         69 LV SV Index:   29 LVOT Area:     2.84 cm  RIGHT VENTRICLE             IVC RV Basal diam:  3.00 cm     IVC diam: 1.60 cm RV S prime:     20.30 cm/s TAPSE (M-mode): 2.0 cm LEFT ATRIUM             Index        RIGHT ATRIUM           Index LA diam:        3.40 cm 1.44 cm/m   RA Area:     13.80 cm LA Vol (A2C):   48.5 ml 20.54 ml/m  RA Volume:   31.50 ml  13.34 ml/m LA Vol (A4C):   42.8 ml 18.12 ml/m LA Biplane Vol: 45.9 ml 19.43 ml/m  AORTIC VALVE LVOT Vmax:   127.00 cm/s LVOT Vmean:  84.400 cm/s LVOT VTI:    0.242 m  AORTA Ao Root diam: 3.30 cm Ao Asc diam:  4.20 cm MITRAL VALVE MV Area (PHT): 2.83 cm    SHUNTS MV Decel Time: 268 msec    Systemic VTI:  0.24 m MV E velocity: 81.90 cm/s  Systemic Diam: 1.90 cm MV A velocity: 99.40 cm/s MV E/A ratio:  0.82 Zoila Shutter MD Electronically signed by Zoila Shutter MD Signature Date/Time: 05/17/2023/11:33:52 AM    Final    DG Chest 2 View  Result Date: 05/17/2023 CLINICAL DATA:  67 year old male with history of transient ischemic attack. EXAM: CHEST - 2 VIEW COMPARISON:  Chest x-ray 02/11/2023. FINDINGS: Lung volumes are normal. Mild linear scarring left mid lung, unchanged. No acute consolidative airspace disease. No pleural effusions. Mild chronic elevation of the left hemidiaphragm again noted. No pneumothorax. No evidence of pulmonary edema. Heart size is borderline enlarged. Upper mediastinal contours are within normal limits. Atherosclerotic calcifications are noted in the thoracic aorta. IMPRESSION: 1. No radiographic evidence of acute cardiopulmonary disease. 2. Aortic atherosclerosis. Electronically  Signed   By: Trudie Reed M.D.   On: 05/17/2023 07:47   CT ANGIO HEAD NECK W WO CM  Result Date: 05/17/2023 CLINICAL DATA:  Left-sided weakness and numbness, acute infarcts on MRI EXAM: CT ANGIOGRAPHY HEAD AND NECK WITH AND WITHOUT CONTRAST TECHNIQUE: Multidetector CT imaging of the head and neck was performed using the standard protocol during bolus administration of intravenous contrast. Multiplanar CT image reconstructions and MIPs were obtained to evaluate the vascular anatomy. Carotid stenosis measurements (when applicable) are obtained utilizing NASCET criteria, using the distal internal carotid diameter as the denominator. RADIATION DOSE REDUCTION: This exam was performed according to the departmental dose-optimization program which includes automated exposure control, adjustment of the mA and/or kV according to patient size and/or use of iterative reconstruction technique. CONTRAST:  75mL OMNIPAQUE IOHEXOL 350 MG/ML SOLN COMPARISON:  No prior CTA available, correlation is made with 05/16/2023 CT head and MRI head FINDINGS: CT HEAD FINDINGS For noncontrast findings, please see 05/16/2023 CT head. CTA NECK FINDINGS Aortic arch: Standard branching. Imaged portion shows no evidence of aneurysm or dissection. No significant stenosis of the major arch vessel origins. Aortic atherosclerosis. Right carotid system: No evidence of dissection, occlusion, or hemodynamically significant stenosis (greater than 50%). Atherosclerotic disease at the bifurcation and in the proximal ICA is not hemodynamically significant. Left carotid system: No evidence of dissection, occlusion, or hemodynamically significant stenosis (greater than 50%). 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 Multifocal mild stenosis in  the right V3 segment. Moderate stenosis of the left vertebral artery at its origin. The left vertebral artery is otherwise patent to the skull base, without significant stenosis. Skeleton: No acute osseous abnormality. Degenerative changes in the cervical spine. Other neck: No acute finding. Upper chest: No focal pulmonary opacity or pleural effusion. Review of the MIP images confirms the above findings CTA HEAD FINDINGS Anterior circulation: Both internal carotid arteries are patent to the termini, without significant stenosis. A1 segments patent. Normal anterior communicating artery. Anterior cerebral arteries are patent to their distal aspects without significant stenosis. No M1 stenosis or occlusion. MCA branches perfused to their distal aspects without significant stenosis. Posterior circulation: Multifocal moderate to severe stenosis in the right vertebral artery. Mild stenosis in the left vertebral artery. The right PICA is patent proximally. There is likely a common origin of the left AICA and left PICA. Basilar patent to its distal aspect without significant stenosis. Superior cerebellar arteries patent on the left. The right superior cerebellar artery is very poorly opacified proximally (series 5, images 121-122). Patent left P1. Suspect a hypoplastic or severely stenosed right P1. Near fetal  origin of the bilateral PCAs from prominent posterior communicating arteries. PCAs perfused to their distal aspects without significant stenosis. Venous sinuses: As permitted by contrast timing, patent. Anatomic variants: Near fetal origin the bilateral PCAs. Review of the MIP images confirms the above findings IMPRESSION: 1. 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 Multifocal moderate to severe stenosis in the right V4  segment. 2. The right superior cerebellar artery is very poorly opacified proximally. 3. Moderate stenosis at the origin of the left vertebral artery and mild stenosis in the left V4 segment. 4. No other intracranial large vessel occlusion. 5. No hemodynamically significant stenosis in the neck. 6. Aortic atherosclerosis. Aortic Atherosclerosis (ICD10-I70.0). Electronically Signed   By: Wiliam Ke M.D.   On: 05/17/2023 00:42   MR BRAIN WO CONTRAST  Result Date: 05/16/2023 CLINICAL DATA:  Headache, stroke suspected EXAM: MRI HEAD WITHOUT CONTRAST TECHNIQUE: Multiplanar, multiecho pulse sequences of the brain and surrounding structures were obtained without intravenous contrast. COMPARISON:  None Available. FINDINGS: Brain: Multiple small foci of restricted diffusion with ADC correlates in the right caudate body (series 5, image 79) an frontoparietal cortex and white matter (series 5, images 81-94). Some of these areas correlate with increased T2 hyperintense signal (for example series 11, image 31 and 42) while the more anterior areas do not yet have T2 correlates), likely hyperacute and acute infarcts. No acute hemorrhage, mass, mass effect, or midline shift. No hydrocephalus or extra-axial collection. No hemosiderin deposition to suggest remote hemorrhage. Vascular: Normal arterial flow voids. Skull and upper cervical spine: Normal marrow signal. Sinuses/Orbits: Clear paranasal sinuses. No acute finding in the orbits. Other: The mastoids are well aerated. IMPRESSION: Multiple small foci of restricted diffusion in the right caudate body and frontoparietal cortex and white matter, likely hyperacute and acute infarcts. These results will be called to the ordering clinician or representative by the Radiologist Assistant, and communication documented in the PACS or [employer]. Electronically Signed   By: Wiliam Ke M.D.   On: 05/16/2023 21:36   CT HEAD WO CONTRAST  Result Date: 05/16/2023 CLINICAL  DATA:  Neuro deficit, acute, stroke suspected Headache, neuro deficit EXAM: CT HEAD WITHOUT CONTRAST TECHNIQUE: Contiguous axial images were obtained from the base of the skull through the vertex without intravenous contrast. RADIATION DOSE REDUCTION: This exam was performed according to the departmental dose-optimization program which includes automated exposure control, adjustment of the mA and/or kV according to patient size and/or use of iterative reconstruction technique. COMPARISON:  None Available. FINDINGS: Brain: No intracranial hemorrhage, mass effect, or midline shift. No hydrocephalus. The basilar cisterns are patent. Minimal periventricular white matter hypodensity is nonspecific, typically chronic small vessel ischemia. No evidence of territorial infarct or acute ischemia. No extra-axial or intracranial fluid collection. Vascular: No hyperdense vessel or unexpected calcification. Skull: Normal. Negative for fracture or focal lesion. Sinuses/Orbits: Small mucous retention cyst left maxillary sinus. Trace mucosal thickening throughout the remaining paranasal sinuses. Mastoid air cells are clear. Other: None. IMPRESSION: 1. No acute intracranial abnormality. 2. Minimal periventricular white matter hypodensity is nonspecific, typically chronic small vessel ischemia. Electronically Signed   By: Narda Rutherford M.D.   On: 05/16/2023 18:14     Labs:   Basic Metabolic Panel: Recent Labs  Lab 05/16/23 1745 05/17/23 0452  NA 135  --   K 4.0  --  CL 101  --   CO2 22  --   GLUCOSE 127*  --   BUN 9  --   CREATININE 1.64* 1.54*  CALCIUM 9.1  --    GFR Estimated Creatinine Clearance: 61.4 mL/min (A) (by C-G formula based on SCr of 1.54 mg/dL (H)). Liver Function Tests: Recent Labs  Lab 05/16/23 1745  AST 34  ALT 33  ALKPHOS 66  BILITOT 1.8*  PROT 7.1  ALBUMIN 3.7   No results for input(s): "LIPASE", "AMYLASE" in the last 168 hours. No results for input(s): "AMMONIA" in the last 168  hours. Coagulation profile No results for input(s): "INR", "PROTIME" in the last 168 hours.  CBC: Recent Labs  Lab 05/16/23 1745 05/17/23 0452  WBC 6.7 7.5  NEUTROABS 5.2  --   HGB 16.3 15.6  HCT 49.1 47.9  MCV 97.2 95.8  PLT 283 279   Cardiac Enzymes: No results for input(s): "CKTOTAL", "CKMB", "CKMBINDEX", "TROPONINI" in the last 168 hours. BNP: Invalid input(s): "POCBNP" CBG: No results for input(s): "GLUCAP" in the last 168 hours. D-Dimer No results for input(s): "DDIMER" in the last 72 hours. Hgb A1c Recent Labs    05/17/23 0452  HGBA1C 5.9*   Lipid Profile Recent Labs    05/17/23 0452  CHOL 174  HDL 28*  LDLCALC 116*  TRIG 152*  CHOLHDL 6.2   Thyroid function studies Recent Labs    05/17/23 0452  TSH 4.577*   Anemia work up No results for input(s): "VITAMINB12", "FOLATE", "FERRITIN", "TIBC", "IRON", "RETICCTPCT" in the last 72 hours. Microbiology No results found for this or any previous visit (from the past 240 hour(s)).   Discharge Instructions:   Discharge Instructions     Ambulatory referral to Neurology   Complete by: As directed    An appointment is requested in approximately: 4 weeks   Diet - low sodium heart healthy   Complete by: As directed    Discharge instructions   Complete by: As directed    Follow-up with your primary care provider in 1 week.  Check blood work at that time.  Follow-up with neurology as outpatient in 3 to 4 weeks (internal referral has been made).  Will be contacted for event monitor as outpatient.   Increase activity slowly   Complete by: As directed       Allergies as of 05/17/2023   No Known Allergies      Medication List     TAKE these medications    acetaminophen 500 MG tablet Commonly known as: TYLENOL Take 500 mg by mouth every 6 (six) hours as needed for moderate pain.   ALPRAZolam 1 MG tablet Commonly known as: XANAX Take 1 tablet (1 mg total) by mouth every 6 (six) hours as needed for  anxiety. for anxiety   aspirin 81 MG chewable tablet Chew 1 tablet (81 mg total) by mouth daily.   atorvastatin 80 MG tablet Commonly known as: LIPITOR Take 1 tablet (80 mg total) by mouth daily. What changed:  medication strength how much to take   carvedilol 6.25 MG tablet Commonly known as: COREG Take 1 tablet (6.25 mg total) by mouth 2 (two) times daily.   clopidogrel 75 MG tablet Commonly known as: PLAVIX Take 1 tablet (75 mg total) by mouth daily.   losartan 100 MG tablet Commonly known as: COZAAR Take 1 tablet (100 mg total) by mouth daily.   testosterone cypionate 200 MG/ML injection Commonly known as: DEPOTESTOSTERONE CYPIONATE INJECT 1 ML INTRAMUSCULARLY EVERY  7 DAYS   triamcinolone cream 0.1 % Commonly known as: KENALOG Apply 1 Application topically 2 (two) times daily as needed. What changed: reasons to take this        Follow-up Information     Nelwyn Salisbury, MD Follow up in 1 week(s).   Specialty: Family Medicine Contact information: [address] [place]ake [place] [address] [phone]         [facility] Follow up.   Specialty: Rehabilitation Contact information: [address] [place] [address] [phone]                 Time coordinating discharge: 39 minutes  Signed:  Tanyla Stege  [facility] 05/18/2023, 8:05 AM

## 2023-05-17 NOTE — Progress Notes (Signed)
OT Cancellation Note  Patient Details Name: Richard Sellers MRN: [id number] DOB: 11-10-1955   Cancelled Treatment:    Reason Eval/Treat Not Completed: Active bedrest order (With HOB to 30*, OT evaluation to follow when activity orders progress)  Donia Pounds 05/17/2023, 7:39 AM

## 2023-05-17 NOTE — Evaluation (Signed)
Occupational Therapy Evaluation Patient Details Name: Richard Sellers MRN: [id number] DOB: 03-Apr-1956 Today's Date: 05/17/2023   History of Present Illness Richard Sellers is a 67 y.o. male who presented with left-sided weakness and numbness.  MRI was completed which revealed multiple small foci of restricted diffusion in the right caudate body and frontoparietal cortex and white matter likely hyperacute and acute infarcts. PMHx: anxiety and depression, hypertension, hyperlipidemia, hypothyroidism   Clinical Impression   Richard Sellers was evaluated s/p the above admission list. 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 Upon evaluation the pt was limited by distal LUE sensory motor deficits. Overall he was indep for transfers and mobility without AD. Due to the deficits listed below the pt also needs up to supervision A for ADLs with increased time for use of LUE. Pt will benefit from continued acute OT services and OP neuro OT.         If plan is discharge home, recommend the following: Assistance with cooking/housework;Assist for transportation    Functional Status Assessment  Patient has had a recent decline in their functional status and demonstrates the ability to make significant improvements in function in a reasonable and predictable amount of time.  Equipment Recommendations  None recommended by OT       Precautions / Restrictions Precautions Precautions: Fall Restrictions Weight Bearing Restrictions: No      Mobility Bed Mobility Overal bed mobility: Independent                  Transfers Overall transfer level: Needs assistance Equipment used: None Transfers: Sit to/from Stand Sit to Stand: Independent                  Balance Overall balance assessment: No apparent balance deficits (not formally assessed)                       ADL either performed or assessed with clinical judgement   ADL Overall ADL's : Needs  assistance/impaired Eating/Feeding: Independent;Sitting   Grooming: Supervision/safety;Standing   Upper Body Bathing: Set up;Sitting   Lower Body Bathing: Supervison/ safety;Sit to/from stand   Upper Body Dressing : Set up;Sitting   Lower Body Dressing: Supervision/safety;Sit to/from stand   Toilet Transfer: Independent;Ambulation   Toileting- Clothing Manipulation and Hygiene: Supervision/safety;Sitting/lateral lean;Sit to/from stand       Functional mobility during ADLs: Independent General ADL Comments: indep for mobility, supervision A for ADLs for safety. Increased time needed for use of LUE     Vision Baseline Vision/History: 1 Wears glasses Patient Visual Report: No change from baseline Vision Assessment?: No apparent visual deficits Additional Comments: Vision screened [facility], wears readers     Perception Perception: Within Functional Limits       Praxis Praxis: [facility]       Pertinent Vitals/Pain Pain Assessment Pain Assessment: No/denies pain     Extremity/Trunk Assessment Upper Extremity Assessment Upper Extremity Assessment: LUE deficits/detail;Right hand dominant LUE Deficits / Details: proximal strength is WFL, gross grasp is 4/5. Impiared dexterity and coordination LUE Sensation: decreased light touch;decreased proprioception LUE Coordination: decreased fine motor   Lower Extremity Assessment Lower Extremity Assessment: Defer to PT evaluation   Cervical / Trunk Assessment Cervical / Trunk Assessment: Kyphotic   Communication Communication Communication: No apparent difficulties   Cognition Arousal: Alert Behavior During Therapy: WFL for tasks assessed/performed Overall Cognitive Status: Within Functional Limits for tasks assessed  General Comments  VSS on RA            Home Living Family/patient expects to be discharged to:: Private residence Living Arrangements: Non-relatives/Friends Available Help at  Discharge: Friend(s);Available PRN/intermittently Type of Home: House Home Access: Stairs to enter [employer] of Steps: 3 Entrance Stairs-Rails: Right;Left Home Layout: One level     Bathroom Shower/Tub: Producer, television/film/video: Standard     Home Equipment: Gilmer Mor - single point   Additional Comments: lives with "housemate" who works 2nd shift      Prior Functioning/Environment Prior Level of Function : Independent/Modified Independent;Working/employed;Driving             Mobility Comments: no AD ADLs Comments: indep, works as a Research scientist (life sciences) at [employer]        OT Problem List: Decreased strength;Impaired balance (sitting and/or standing);Decreased cognition;Decreased safety awareness;Decreased knowledge of use of DME or AE;Impaired sensation      OT Treatment/Interventions: Self-care/ADL training;Neuromuscular education;Therapeutic activities;Patient/family education    OT Goals(Current goals can be found in the care plan section) Acute Rehab OT Goals Patient Stated Goal: to get back to work OT Goal Formulation: With patient Time For Goal Achievement: 05/31/23 Potential to Achieve Goals: Good ADL Goals Additional ADL Goal #1: Pt will complete all ADLs independently Additional ADL Goal #2: Pt will indep complet LUE neurorehab HEP  OT Frequency: Min 1X/week       AM-PAC OT "6 Clicks" Daily Activity     Outcome Measure Help from another person eating meals?: None Help from another person taking care of personal grooming?: A Little Help from another person toileting, which includes using toliet, bedpan, or urinal?: None Help from another person bathing (including washing, rinsing, drying)?: A Little Help from another person to put on and taking off regular upper body clothing?: A Little Help from another person to put on and taking off regular lower body clothing?: A Little 6 Click Score: 20   End of Session Nurse Communication: Mobility  status  Activity Tolerance: Patient tolerated treatment well Patient left: in chair;with call bell/phone within reach  OT Visit Diagnosis: Hemiplegia and hemiparesis Hemiplegia - Right/Left: Left Hemiplegia - dominant/non-dominant: Non-Dominant Hemiplegia - caused by: Cerebral infarction                Time: 4742-5956 OT Time Calculation (min): 18 min Charges:  OT General Charges $OT Visit: 1 Visit OT Evaluation $OT Eval Moderate Complexity: 1 Mod  Derenda Mis, OTR/L Acute Rehabilitation Services Office [phone] Secure Chat Communication Preferred   Donia Pounds 05/17/2023, 9:18 AM

## 2023-05-17 NOTE — Progress Notes (Signed)
  Echocardiogram 2D Echocardiogram has been performed.  Delcie Roch 05/17/2023, 11:15 AM

## 2023-05-17 NOTE — Progress Notes (Signed)
PT Cancellation Note  Patient Details Name: Richard Sellers MRN: [id number] DOB: [birth year]   Cancelled Treatment:    Reason Eval/Treat Not Completed: OT screened, no needs identified, will sign off  Harrel Carina, DPT, CLT  Acute Rehabilitation Services Office: [phone] (Secure chat preferred)    Richard Sellers 05/17/2023, 10:43 AM

## 2023-05-18 ENCOUNTER — Other Ambulatory Visit: Payer: Self-pay | Admitting: Cardiology

## 2023-05-18 DIAGNOSIS — I639 Cerebral infarction, unspecified: Secondary | ICD-10-CM

## 2023-05-19 ENCOUNTER — Encounter: Payer: Self-pay | Admitting: Family Medicine

## 2023-05-19 ENCOUNTER — Ambulatory Visit (INDEPENDENT_AMBULATORY_CARE_PROVIDER_SITE_OTHER): Payer: Medicare Other | Admitting: Family Medicine

## 2023-05-19 VITALS — BP 120/94 | HR 61 | Temp 98.2°F

## 2023-05-19 DIAGNOSIS — Z8673 Personal history of transient ischemic attack (TIA), and cerebral infarction without residual deficits: Secondary | ICD-10-CM

## 2023-05-19 DIAGNOSIS — R7989 Other specified abnormal findings of blood chemistry: Secondary | ICD-10-CM | POA: Diagnosis not present

## 2023-05-19 DIAGNOSIS — I10 Essential (primary) hypertension: Secondary | ICD-10-CM | POA: Diagnosis not present

## 2023-05-19 DIAGNOSIS — I639 Cerebral infarction, unspecified: Secondary | ICD-10-CM

## 2023-05-19 LAB — T3, FREE: T3, Free: 4.2 pg/mL (ref 2.3–4.2)

## 2023-05-19 LAB — T4, FREE: Free T4: 0.93 ng/dL (ref 0.60–1.60)

## 2023-05-19 LAB — TSH: TSH: 2.26 u[IU]/mL (ref 0.35–5.50)

## 2023-05-19 MED ORDER — ALPRAZOLAM 1 MG PO TABS: 1.0000 mg | ORAL_TABLET | Freq: Four times a day (QID) | ORAL | 5 refills | Status: DC | PRN

## 2023-05-19 MED ORDER — TESTOSTERONE CYPIONATE 200 MG/ML IM SOLN: INTRAMUSCULAR | 3 refills | Status: AC

## 2023-05-19 NOTE — Progress Notes (Signed)
   Subjective:    Patient ID: Richard Sellers, male    DOB: May 16, 1956, 67 y.o.   MRN: [id number]  HPI Here to follow up a hospital stay from 05-16-23 to 05-18-23 for a stroke. He presented with weakness in the left hand and arm as well as a headache. A brain MRI revealed decreased perfusion in the right caudate body and the right frontoparietal cortex. 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 An A1c was stable at 5.9%. his Hgb was normal. His TSH was mildly elevated at 4.577. his creatinine was slightly high at 1.54. the LDL was 116. After he was stabilized the headache quickly resolved. He was sent home on ASA and Plavix, as well as Atorvastatin. His ECHO showed an EF of 55-60% with some LVH and Grade I diastolic dysfunction. A 30 day cardiac event monitor was ordered and is pending. Today he feels fine except he still has weakness in the left hand and arm. He cannot grip things in the left hand, so he cannot work and he cannot drive. He will be starting on PT and OT later this week.    Review of Systems  Constitutional: Negative.   Respiratory: Negative.    Cardiovascular: Negative.   Gastrointestinal: Negative.   Genitourinary: Negative.   Neurological:  Positive for weakness and numbness. Negative for dizziness, seizures, facial asymmetry, speech difficulty, light-headedness and headaches.       Objective:   Physical Exam Constitutional:      Appearance: Normal appearance. He is not ill-appearing.  Cardiovascular:     Rate and Rhythm: Normal rate and regular rhythm.     Pulses: Normal pulses.     Heart sounds: Normal heart sounds.  Pulmonary:     Effort: Pulmonary effort is normal.     Breath sounds: Normal breath sounds.  Musculoskeletal:     Right lower leg: No edema.     Left lower leg: No edema.  Neurological:     Mental Status: He is alert and oriented to person, place, and time.     Coordination: Coordination normal.      Gait: Gait normal.     Comments: His left hand grip is 3/4            Assessment & Plan:  He is S/P a stroke and he has residual left hand weakness. He will start PT and OT. He will stay out of work for the time being. He has a cardiac event monitor pending. He will follow up with Neurology in a few weeks. We will take a closer look at his thyroid levels. We spent a total of ( 35  ) minutes reviewing records and discussing these issues.  Gershon Crane, MD  Gershon Crane, MD

## 2023-05-21 ENCOUNTER — Encounter: Payer: Self-pay | Admitting: Family Medicine

## 2023-05-22 ENCOUNTER — Encounter: Payer: Self-pay | Admitting: Family Medicine

## 2023-05-28 ENCOUNTER — Encounter: Payer: Self-pay | Admitting: Internal Medicine

## 2023-05-28 NOTE — Telephone Encounter (Signed)
See my other reply today

## 2023-05-28 NOTE — Telephone Encounter (Signed)
Pt called I was given neurologist phone number he does not know how to put on monitor

## 2023-05-28 NOTE — Telephone Encounter (Signed)
I would strongly encourage him to wear the monitor to rule out any dangerous heart rhythms. This is his choice however. As far as going back to work, I would leave this up to his Neurologist

## 2023-05-29 ENCOUNTER — Telehealth: Payer: Self-pay | Admitting: Cardiology

## 2023-05-29 NOTE — Telephone Encounter (Signed)
Sent to [place] for monitor appt request

## 2023-05-29 NOTE — Telephone Encounter (Signed)
Patient is following up. He requests a MyChart message if he is unavailable when his call is returned. He states he has had issues with incoming calls.

## 2023-05-29 NOTE — Telephone Encounter (Signed)
Patient is requesting to see Richard Sellers to help him place on his heart monitor. Patient is requesting to schedule the appointment a week from next Tuesday as late in the afternoon as he can.

## 2023-05-29 NOTE — Telephone Encounter (Signed)
Returned call to patient to schedule him an appt to come have his heart monitor placed. Patients states he has a nurse friend that is cooming over Monday afternoon to help him. Patient knows to call us if he ends up needing out help.

## 2023-05-29 NOTE — Telephone Encounter (Signed)
Will forward to [facility] as that is where this is done.

## 2023-06-01 ENCOUNTER — Other Ambulatory Visit: Payer: Self-pay | Admitting: *Deleted

## 2023-06-01 DIAGNOSIS — I639 Cerebral infarction, unspecified: Secondary | ICD-10-CM | POA: Diagnosis not present

## 2023-06-03 ENCOUNTER — Encounter: Payer: Self-pay | Admitting: Family Medicine

## 2023-06-03 ENCOUNTER — Ambulatory Visit: Payer: Medicare Other | Admitting: Nurse Practitioner

## 2023-06-03 NOTE — Telephone Encounter (Signed)
Make an in person OV to discuss this

## 2023-06-05 ENCOUNTER — Telehealth: Payer: Medicare Other | Admitting: Family Medicine

## 2023-06-08 ENCOUNTER — Encounter: Payer: Medicare Other | Admitting: Occupational Therapy

## 2023-06-08 ENCOUNTER — Telehealth: Payer: Self-pay | Admitting: Cardiology

## 2023-06-08 MED ORDER — LOSARTAN POTASSIUM 100 MG PO TABS: 100.0000 mg | ORAL_TABLET | Freq: Every day | ORAL | 3 refills | Status: DC

## 2023-06-08 MED ORDER — CARVEDILOL 6.25 MG PO TABS: 6.2500 mg | ORAL_TABLET | Freq: Two times a day (BID) | ORAL | 3 refills | Status: DC

## 2023-06-08 MED ORDER — CLOPIDOGREL BISULFATE 75 MG PO TABS: 75.0000 mg | ORAL_TABLET | Freq: Every day | ORAL | 3 refills | Status: DC

## 2023-06-08 NOTE — Telephone Encounter (Signed)
Pt's medications were sent to pt's pharmacy as requested. Confirmation received.  

## 2023-06-08 NOTE — Telephone Encounter (Signed)
*  STAT* If patient is at the pharmacy, call can be transferred to refill team.   1. Which medications need to be refilled? (please list name of each medication and dose if known)   losartan (COZAAR) 100 MG tablet  clopidogrel (PLAVIX) 75 MG tablet  carvedilol (COREG) 6.25 MG tablet   2. Which pharmacy/location (including street and city if local pharmacy) is medication to be sent to?  [facility] [id number] - [place], [place] - [address]    3. Do they need a 30 day or 90 day supply? 90

## 2023-06-12 ENCOUNTER — Ambulatory Visit: Payer: Medicare Other | Admitting: Nurse Practitioner

## 2023-06-22 ENCOUNTER — Ambulatory Visit: Payer: Medicare Other | Admitting: Nurse Practitioner

## 2023-06-29 ENCOUNTER — Ambulatory Visit: Payer: Medicare Other | Admitting: Nurse Practitioner

## 2023-07-03 ENCOUNTER — Ambulatory Visit: Payer: Medicare Other | Attending: Cardiology

## 2023-07-03 DIAGNOSIS — I639 Cerebral infarction, unspecified: Secondary | ICD-10-CM

## 2023-07-10 ENCOUNTER — Telehealth: Payer: Self-pay | Admitting: Cardiology

## 2023-07-10 ENCOUNTER — Ambulatory Visit: Payer: Medicare Other | Admitting: Adult Health

## 2023-07-10 NOTE — Progress Notes (Deleted)
Cardiology Office Note:  .   Date:  07/10/2023  ID:  Richard Sellers, DOB [birth year], MRN [id number] PCP: Nelwyn Salisbury, MD  [facility] Providers Cardiologist:  Thomasene Ripple, DO { }   History of Present Illness: .   Richard Sellers is a 67 y.o. male with history of NSTEMI, nonobstructive CAD, chronic diastolic heart failure, hypertension, possible myocarditis, hyperlipidemia, chronic kidney disease stage IIIa, hypothyroidism and GERD.  Last seen by Bernadene Person, DNP, on 05/01/2023.  She discontinued colchicine at that office visit, and decrease carvedilol to 6.25 mg twice daily.  He is here for follow-up after medication changes.  ROS: ***  Studies Reviewed: .        *** EKG Interpretation Date/Time:    Ventricular Rate:    PR Interval:    QRS Duration:    QT Interval:    QTC Calculation:   R Axis:      Text Interpretation:     Physical Exam:   VS:  There were no vitals taken for this visit.   Wt Readings from Last 3 Encounters:  05/16/23 281 lb 1.4 oz (127.5 kg)  05/01/23 280 lb (127 kg)  02/26/23 267 lb 6.4 oz (121.3 kg)    GEN: Well nourished, well developed in no acute distress NECK: No JVD; No carotid bruits CARDIAC: ***RRR, no murmurs, rubs, gallops RESPIRATORY:  Clear to auscultation without rales, wheezing or rhonchi  ABDOMEN: Soft, non-tender, non-distended EXTREMITIES:  No edema; No deformity   ASSESSMENT AND PLAN: .   ***    {Are you ordering a CV Procedure (e.g. stress test, cath, DCCV, TEE, etc)?   Press F2        :[id number]}  Dispo: ***  Signed, Bettey Mare. Liborio Nixon, ANP, AACC

## 2023-07-10 NOTE — Telephone Encounter (Signed)
*  STAT* If patient is at the pharmacy, call can be transferred to refill team.   1. Which medications need to be refilled? (please list name of each medication and dose if known)   carvedilol (COREG) 6.25 MG tablet  losartan (COZAAR) 100 MG tablet  clopidogrel (PLAVIX) 75 MG tablet  atorvastatin (LIPITOR) 80 MG tablet   2. Would you like to learn more about the convenience, safety, & potential cost savings by using the [facility]?   3. Are you open to using the [facility] (Type [facility]. ).  4. Which pharmacy/location (including street and city if local pharmacy) is medication to be sent to?  [facility] [id number] - [place], [place] - [address]   5. Do they need a 30 day or 90 day supply?   90 day  Patient stated is running out of these medications.  Patient has appointment scheduled on 10/29.

## 2023-07-15 ENCOUNTER — Ambulatory Visit: Payer: Medicare Other | Admitting: Behavioral Health

## 2023-07-21 ENCOUNTER — Ambulatory Visit: Payer: Medicare Other | Admitting: Nurse Practitioner

## 2023-07-21 ENCOUNTER — Encounter: Payer: Self-pay | Admitting: Family Medicine

## 2023-07-23 NOTE — Telephone Encounter (Signed)
Spoke with pt state that he wanted to discuss the Heart monitor results. Done on 07/03/23. Report is on Epic.Pt is aware that this test was ordered by Cardiology. Pt states that he has not received a phone call from his cardiology. Please advise

## 2023-07-24 NOTE — Telephone Encounter (Signed)
I will have him discuss this with Dr. Servando Salina. She wants him to come in to discuss it, so he can call and make an appt on his own

## 2023-07-24 NOTE — Telephone Encounter (Signed)
Spoke with pt verbalized understanding Of Dr Clent Ridges advise, Pt states that he has appointment scheduled with cardiology on 08/21/23

## 2023-08-18 ENCOUNTER — Ambulatory Visit: Payer: Medicare Other | Admitting: Nurse Practitioner

## 2023-09-22 ENCOUNTER — Ambulatory Visit: Payer: Medicare Other | Admitting: Nurse Practitioner

## 2023-10-13 ENCOUNTER — Ambulatory Visit (INDEPENDENT_AMBULATORY_CARE_PROVIDER_SITE_OTHER): Payer: Medicare Other

## 2023-10-13 ENCOUNTER — Encounter: Payer: Self-pay | Admitting: Family Medicine

## 2023-10-13 ENCOUNTER — Ambulatory Visit (INDEPENDENT_AMBULATORY_CARE_PROVIDER_SITE_OTHER): Payer: Medicare Other | Admitting: Family Medicine

## 2023-10-13 VITALS — BP 94/72 | HR 94 | Temp 98.4°F | Wt 280.0 lb

## 2023-10-13 DIAGNOSIS — I959 Hypotension, unspecified: Secondary | ICD-10-CM

## 2023-10-13 DIAGNOSIS — S40011A Contusion of right shoulder, initial encounter: Secondary | ICD-10-CM

## 2023-10-13 DIAGNOSIS — M25511 Pain in right shoulder: Secondary | ICD-10-CM | POA: Diagnosis not present

## 2023-10-13 DIAGNOSIS — M19011 Primary osteoarthritis, right shoulder: Secondary | ICD-10-CM | POA: Diagnosis not present

## 2023-10-13 MED ORDER — LOSARTAN POTASSIUM 100 MG PO TABS: 50.0000 mg | ORAL_TABLET | Freq: Every day | ORAL | Status: DC

## 2023-10-13 MED ORDER — ONDANSETRON HCL 8 MG PO TABS: 8.0000 mg | ORAL_TABLET | Freq: Three times a day (TID) | ORAL | 2 refills | Status: DC | PRN

## 2023-10-13 MED ORDER — ATORVASTATIN CALCIUM 80 MG PO TABS: 80.0000 mg | ORAL_TABLET | Freq: Every day | ORAL | 3 refills | Status: DC

## 2023-10-13 NOTE — Progress Notes (Signed)
   Subjective:    Patient ID: Richard Sellers, male    DOB: Aug 02, 1956, 68 y.o.   MRN: [id number]  HPI Here for 2 issues. First he has felt very weak and lightheaded for several days. No fever or ST or cough. No SOB or chest discomfort. Also 2 weeks ago he slipped on ice at home and fell, landing on his right shoulder. He has had pain in the shoulder ever since. Using ice and Tylenol.    Review of Systems  Constitutional:  Positive for fatigue.  Respiratory: Negative.    Cardiovascular: Negative.   Gastrointestinal: Negative.   Genitourinary: Negative.   Musculoskeletal:  Positive for arthralgias.  Neurological:  Positive for light-headedness. Negative for dizziness, tremors, seizures, syncope, facial asymmetry, speech difficulty, numbness and headaches.       Objective:   Physical Exam Constitutional:      Appearance: Normal appearance.  Cardiovascular:     Rate and Rhythm: Normal rate and regular rhythm.     Pulses: Normal pulses.     Heart sounds: Normal heart sounds.  Pulmonary:     Effort: Pulmonary effort is normal.     Breath sounds: Normal breath sounds.  Musculoskeletal:     Comments: Right shoulder is tender laterally. No crepitus. ROM is full   Neurological:     General: No focal deficit present.     Mental Status: He is alert and oriented to person, place, and time.           Assessment & Plan:  He has been feeling weak, and I believe this is from hypotension. We will decrease the Losartan to 1/2 a tablet (50 mg) daily. He will follow up with Cardiology on 10-22-23. He also has a contused shoulder. We will get Xrays today. Gershon Crane, MD

## 2023-10-14 ENCOUNTER — Encounter: Payer: Self-pay | Admitting: Family Medicine

## 2023-10-14 NOTE — Telephone Encounter (Signed)
Yes he can, up to 3000 mg a day

## 2023-10-15 ENCOUNTER — Ambulatory Visit: Payer: Self-pay | Admitting: Family Medicine

## 2023-10-15 ENCOUNTER — Encounter: Payer: Self-pay | Admitting: Family Medicine

## 2023-10-15 NOTE — Telephone Encounter (Signed)
Copied from CRM [id number]. Topic: Clinical - Red Word Triage >> Oct 15, 2023  3:04 PM Gurney Maxin H wrote: [facility] that prompted transfer to Nurse Triage: Provider changed blood pressure medication losartan (COZAAR) 100 MG tablet taking 50mg  dose cutting in half, because blood pressure was getting too low feels like he's going through withdraws. Feeling agitated, can't sleep, strange feeling that's hard to explain. Advised patient to keep a blood pressure log daily.    Reason for Disposition  Health Information question, no triage required and triager able to answer question  Answer Assessment - Initial Assessment Questions 1. REASON FOR CALL or QUESTION: "What is your reason for calling today?" or "How can I best help you?" or "What question do you have that I can help answer?"     Patient called in questioning whether or not he should come back in because he is feeling anxiety about cutting back on his xanax from 4 a day to 2 a day, and Dr. Clent Ridges had reduced his Losartan in half. Patient is feeling agitated and not sleeping good so he is worried he may be having withdrawal symptoms. Advised patient on symptoms to look for, and BP parameters to call us back for. Patient verbalized understanding.  Protocols used: Information Only Call - No Triage-A-AH

## 2023-10-16 NOTE — Telephone Encounter (Signed)
FYI

## 2023-10-16 NOTE — Telephone Encounter (Signed)
Yes that is a common reaction. If it gets worse, let us know

## 2023-10-19 ENCOUNTER — Encounter: Payer: Self-pay | Admitting: Family Medicine

## 2023-10-20 ENCOUNTER — Ambulatory Visit (INDEPENDENT_AMBULATORY_CARE_PROVIDER_SITE_OTHER): Payer: Medicare Other | Admitting: Family Medicine

## 2023-10-20 ENCOUNTER — Ambulatory Visit: Payer: Self-pay | Admitting: Family Medicine

## 2023-10-20 ENCOUNTER — Ambulatory Visit: Payer: Medicare Other

## 2023-10-20 VITALS — BP 110/84 | HR 80 | Temp 99.1°F | Wt 280.0 lb

## 2023-10-20 DIAGNOSIS — I959 Hypotension, unspecified: Secondary | ICD-10-CM

## 2023-10-20 DIAGNOSIS — M25562 Pain in left knee: Secondary | ICD-10-CM

## 2023-10-20 NOTE — Telephone Encounter (Signed)
Answer Assessment - Initial Assessment Questions 1. DESCRIPTION: "Describe how you are feeling."     The same as when he was just seen in the office 2. SEVERITY: "How bad is it?"  "Can you stand and walk?"   - MILD (0-3): Feels weak or tired, but does not interfere with work, school or normal activities.   - MODERATE (4-7): Able to stand and walk; weakness interferes with work, school, or normal activities.   - SEVERE (8-10): Unable to stand or walk; unable to do usual activities.     Feels weak, generally feeling unwell, the same as when he found out his blood pressure was low at his last visit but he hasn't taken it at home 3. ONSET: "When did these symptoms begin?" (e.g., hours, days, weeks, months)     Almost a week ago now 4. CAUSE: "What do you think is causing the weakness or fatigue?" (e.g., not drinking enough fluids, medical problem, trouble sleeping)     Patient believes from a medication dosage change of Losartan from 100mg  to 50mg  5. NEW MEDICINES:  "Have you started on any new medicines recently?" (e.g., opioid pain medicines, benzodiazepines, muscle relaxants, antidepressants, antihistamines, neuroleptics, beta blockers)     Change in Losartan dosage from 100mg  to 50mg  6. OTHER SYMPTOMS: "Do you have any other symptoms?" (e.g., chest pain, fever, cough, SOB, vomiting, diarrhea, bleeding, other areas of pain)      Left knee pain from complaint he was seen recently for and had his shoulder xrayed,  Protocols used: Weakness (Generalized) and Fatigue-A-AH

## 2023-10-20 NOTE — Telephone Encounter (Signed)
Copied from CRM [id number]. Topic: Clinical - Red Word Triage >> Oct 20, 2023  8:04 AM Steele Sizer wrote: Red Word that prompted transfer to Nurse Triage: Pt said he is still not feeling any better, his left knee is having severe pain, can't walk, nasueous, dizziness, low blood pressure. Pt would like to know if he can chnage the mew medication he just started regarding his blood pressure losartan (COZAAR) 100 MG tablet. Pt is currently going through withdrawals after cutting the medication in half.   Chief Complaint: Not feeling better from recent visit last week with left knee pain from that same visit complaint Symptoms: weakness, hot/cold flashes, pain Frequency: Almost a week now Pertinent Negatives: Patient denies chest pain, abdominal pain, fevers. Disposition: [] ED /[] Urgent Care (no appt availability in office) / [x] Appointment(In office/virtual)/ []  [facility]/ [] Home Care/ [] Refused Recommended Disposition /[] [facility]/ []  Follow-up with PCP Additional Notes: Patient called and advised that he states that he just had a recent change of his Losartan medication--pt told to cut his 100mg  tablets in half.  He also states that he is having pain in his left knee that took most of the brunt of pain. Patient came in to the office Tuesday 10/13/2023 and was told his blood pressure was low then and today he states that he feels the same.  Patient states that the medication dosage change has made him experience nausea, hot and cold flashes, and he spoke with Dr Clent Ridges through MyChart and was advised the medication change may be making him feel some of these symptoms.  Patient has been having to cut his 100mg  Losartan tablets in half but they are not scored tablets so it may not be accurately cutting them in half.  Patient was also wondering if he could get a left knee x-ray because that is hurting him a decent amount now, he had his shoulder examined at his recent office visit.   Appointment made for today with patient's PCP 10/20/2023 at 2:15 pm. Patient advised that he has a home bp cuff that he was told to bring with him to an appt with another provider on  Thursday to make sure it was reading correctly.  Patient is advised to bring it with him today to his PCP office to make sure it is working correctly and being used correctly. Patient is also advised that if anything gets worse he can call 911 or get someone to take him to the emergency room.  Patient verbalized understanding.   Reason for Disposition  Taking a medicine that could cause weakness (e.g., blood pressure medications, diuretics)  Protocols used: Weakness (Generalized) and Fatigue-A-AH

## 2023-10-20 NOTE — Progress Notes (Signed)
   Subjective:    Patient ID: Richard Sellers, male    DOB: [birth year], 68 y.o.   MRN: [id number]  HPI Here to follow up on hypotension. At our lat visit his BP was 94/72, and we decided he would take 1/2 tablet of Losartan (50 mg) daily. Since then he has felt better with less fatigue. He also wants to check his left knee. He fell on ice about 3 weeks ago, and we checked his right shoulder last time. The shoulder feels better but he still has pain in the medial knee.    Review of Systems  Constitutional:  Positive for fatigue.  Respiratory: Negative.    Cardiovascular: Negative.   Musculoskeletal:  Positive for arthralgias.       Objective:   Physical Exam Constitutional:      Appearance: Normal appearance.  Cardiovascular:     Rate and Rhythm: Normal rate and regular rhythm.     Pulses: Normal pulses.     Heart sounds: Normal heart sounds.  Pulmonary:     Effort: Pulmonary effort is normal.     Breath sounds: Normal breath sounds.  Musculoskeletal:     Comments: The left knee is tender along the medial joint space. ROM is full. No swelling   Neurological:     Mental Status: He is alert.           Assessment & Plan:  His hypotension has improved. He will see Cardiology later this week. He also sprained the left knee 3 weeks ago. We wil get Xrays today.  Gershon Crane, MD

## 2023-10-21 NOTE — Progress Notes (Signed)
Office Visit    Patient Name: Richard Sellers Date of Encounter: 10/22/2023  Primary Care Provider:  Nelwyn Salisbury, MD Primary Cardiologist:  Thomasene Ripple, DO  Chief Complaint   68 year old male with a history of NSTEMI, nonobstructive CAD, possible myopericarditis, chronic diastolic heart failure, hypertension, hyperlipidemia,  CVA, CKD stage IIIa, hypothyroidism, ED, psoriasis, anxiety, depression, and GERD who presents for follow-up related to CAD and hypertension.   Past Medical History    Past Medical History:  Diagnosis Date   Allergy    seasonal allergies   Anxiety    on meds   Chronic kidney disease    hx of kidney stones   Depression    on meds   ED (erectile dysfunction)    GERD (gastroesophageal reflux disease)    not on meds-diet controlled-uses OTC meds for tx   Heart murmur    at age 73 years old, heard x1 , never again   History of kidney stones    Hyperlipidemia    on meds   Hypertension    on meds   Hypothyroidism    hx of-was on meds-   PONV (postoperative nausea and vomiting)    Warts, genital    Past Surgical History:  Procedure Laterality Date   APPENDECTOMY  1975   COLONOSCOPY  11/08/2007   per Dr. Jarold Motto, diverticulosis but no polyps, repeat in 10 yrs    CYSTECTOMY     EXTRACORPOREAL SHOCK WAVE LITHOTRIPSY Left 10/29/2017   Procedure: LEFT EXTRACORPOREAL SHOCK WAVE LITHOTRIPSY (ESWL);  Surgeon: Marcine Matar, MD;  Location: [facility];  Service: Urology;  Laterality: Left;   HERNIA REPAIR  07/2017   umbilical hernia   KNEE ARTHROSCOPY Right 1993   LEFT HEART CATH AND CORONARY ANGIOGRAPHY N/A 02/12/2023   Procedure: LEFT HEART CATH AND CORONARY ANGIOGRAPHY;  Surgeon: [place], Peter M, MD;  Location: [facility] INVASIVE CV LAB;  Service: Cardiovascular;  Laterality: N/A;   TONSILLECTOMY     WISDOM TOOTH EXTRACTION  1990    Allergies  No Known Allergies   Labs/Other Studies Reviewed    The following studies were reviewed today:  Cardiac  Studies & Procedures   CARDIAC CATHETERIZATION  CARDIAC CATHETERIZATION 02/12/2023  Narrative   Prox RCA to Mid RCA lesion is 20% stenosed.   LV end diastolic pressure is moderately elevated.  Mild nonobstructive CAD Moderately elevated LVEDP 28 mm Hg  Plan: patient has MINOCA possibly related to severe uncontrolled HTN. LVEDP is elevated. Will manage medically.  Findings Coronary Findings Diagnostic  Dominance: Right  Left Main Vessel was injected. Vessel is normal in caliber. Vessel is angiographically normal.  Left Anterior Descending Vessel was injected. Vessel is normal in caliber. The vessel exhibits minimal luminal irregularities.  Ramus Intermedius Vessel was injected. Vessel is small. Vessel is angiographically normal.  Left Circumflex Vessel was injected. Vessel is normal in caliber. The vessel exhibits minimal luminal irregularities.  Right Coronary Artery Prox RCA to Mid RCA lesion is 20% stenosed.  Intervention  No interventions have been documented.    ECHOCARDIOGRAM  ECHOCARDIOGRAM COMPLETE 05/17/2023  Narrative ECHOCARDIOGRAM REPORT    Patient Name:   Richard Sellers Date of Exam: 05/17/2023 Medical Rec #:  [id number]        Height:       68.0 in Accession #:    [id number]       Weight:       281.1 lb Date of Birth:  04/11/1956  BSA:          2.362 m Patient Age:    66 years         BP:           127/86 mmHg Patient Gender: M                HR:           70 bpm. Exam Location:  Inpatient  Procedure: 2D Echo, Cardiac Doppler and Color Doppler  Indications:    stroke  History:        Patient has prior history of Echocardiogram examinations, most recent 02/11/2023. Signs/Symptoms:Edema; Risk Factors:Current Smoker and Hypertension.  Sonographer:    Delcie Roch RDCS Referring Phys: [id number] ALEXIS HUGELMEYER   Sonographer Comments: Patient is obese. Image acquisition challenging due to patient body habitus. IMPRESSIONS   1. Left  ventricular ejection fraction, by estimation, is 55 to 60%. Left ventricular ejection fraction by PLAX is 57 %. The left ventricle has normal function. The left ventricle has no regional wall motion abnormalities. There is mild left ventricular hypertrophy. Left ventricular diastolic parameters are consistent with Grade I diastolic dysfunction (impaired relaxation). 2. Right ventricular systolic function is normal. The right ventricular size is normal. Tricuspid regurgitation signal is inadequate for assessing PA pressure. 3. Left atrial size was mildly dilated. 4. Right atrial size was mildly dilated. 5. The mitral valve is grossly normal. No evidence of mitral valve regurgitation. 6. The aortic valve is tricuspid. Aortic valve regurgitation is not visualized. 7. Aortic dilatation noted. There is mild dilatation of the ascending aorta, measuring 42 mm. 8. The inferior vena cava is normal in size with greater than 50% respiratory variability, suggesting right atrial pressure of 3 mmHg.  Comparison(s): No significant change from prior study. 02/11/2023: LVEF 55-60%, mild biatrial enlargement.  FINDINGS Left Ventricle: Left ventricular ejection fraction, by estimation, is 55 to 60%. Left ventricular ejection fraction by PLAX is 57 %. The left ventricle has normal function. The left ventricle has no regional wall motion abnormalities. The left ventricular internal cavity size was normal in size. There is mild left ventricular hypertrophy. Left ventricular diastolic parameters are consistent with Grade I diastolic dysfunction (impaired relaxation). Indeterminate filling pressures.  Right Ventricle: The right ventricular size is normal. No increase in right ventricular wall thickness. Right ventricular systolic function is normal. Tricuspid regurgitation signal is inadequate for assessing PA pressure.  Left Atrium: Left atrial size was mildly dilated.  Right Atrium: Right atrial size was mildly  dilated.  Pericardium: There is no evidence of pericardial effusion.  Mitral Valve: The mitral valve is grossly normal. No evidence of mitral valve regurgitation.  Tricuspid Valve: The tricuspid valve is normal in structure. Tricuspid valve regurgitation is not demonstrated.  Aortic Valve: The aortic valve is tricuspid. Aortic valve regurgitation is not visualized.  Pulmonic Valve: The pulmonic valve was normal in structure. Pulmonic valve regurgitation is not visualized.  Aorta: Aortic dilatation noted. There is mild dilatation of the ascending aorta, measuring 42 mm.  Venous: The inferior vena cava is normal in size with greater than 50% respiratory variability, suggesting right atrial pressure of 3 mmHg.  IAS/Shunts: No atrial level shunt detected by color flow Doppler.   LEFT VENTRICLE PLAX 2D LV EF:         Left            Diastology ventricular     LV e' medial:    8.38 cm/s ejection  LV E/e' medial:  9.8 fraction by     LV e' lateral:   7.18 cm/s PLAX is 57      LV E/e' lateral: 11.4 %. LVIDd:         5.00 cm LVIDs:         3.50 cm LV PW:         1.10 cm LV IVS:        1.10 cm LVOT diam:     1.90 cm LV SV:         69 LV SV Index:   29 LVOT Area:     2.84 cm   RIGHT VENTRICLE             IVC RV Basal diam:  3.00 cm     IVC diam: 1.60 cm RV S prime:     20.30 cm/s TAPSE (M-mode): 2.0 cm  LEFT ATRIUM             Index        RIGHT ATRIUM           Index LA diam:        3.40 cm 1.44 cm/m   RA Area:     13.80 cm LA Vol (A2C):   48.5 ml 20.54 ml/m  RA Volume:   31.50 ml  13.34 ml/m LA Vol (A4C):   42.8 ml 18.12 ml/m LA Biplane Vol: 45.9 ml 19.43 ml/m AORTIC VALVE LVOT Vmax:   127.00 cm/s LVOT Vmean:  84.400 cm/s LVOT VTI:    0.242 m  AORTA Ao Root diam: 3.30 cm Ao Asc diam:  4.20 cm  MITRAL VALVE MV Area (PHT): 2.83 cm    SHUNTS MV Decel Time: 268 msec    Systemic VTI:  0.24 m MV E velocity: 81.90 cm/s  Systemic Diam: 1.90 cm MV A velocity:  99.40 cm/s MV E/A ratio:  0.82  Zoila Shutter MD Electronically signed by Zoila Shutter MD Signature Date/Time: 05/17/2023/11:33:52 AM    Final   MONITORS  CARDIAC EVENT MONITOR 07/03/2023  Narrative The patient wore the monitor for 30  days  starting 06/01/2023.  . Indication: Cryptogenic stroke  The minimum heart rate was 48 bpm, maximum heart rate was 112 bpm, and average heart rate was 69  bpm. Predominant underlying rhythm was Sinus Rhythm.   Premature atrial complexes were rare. Premature Ventricular complexes rare.  One pause noted lasting 3.9 seconds.  No AV block and no atrial fibrillation present.  Patient triggered events associated with sinus rhythm.  Conclusion: This study is showed evidence of 3.9 seconds pause. This pause is in the setting of low dose beta blocker.  Asymptomatic will optimize medication and stop beta blocker.          Recent Labs: 02/13/2023: Magnesium 1.8 05/16/2023: ALT 33; BUN 9; Potassium 4.0; Sodium 135 05/17/2023: Creatinine, Ser 1.54; Hemoglobin 15.6; Platelets 279 05/19/2023: TSH 2.26  Recent Lipid Panel    Component Value Date/Time   CHOL 174 05/17/2023 0452   CHOL 139 05/01/2023 1702   TRIG 152 (H) 05/17/2023 0452   HDL 28 (L) 05/17/2023 0452   HDL 35 (L) 05/01/2023 1702   CHOLHDL 6.2 05/17/2023 0452   VLDL 30 05/17/2023 0452   LDLCALC 116 (H) 05/17/2023 0452   LDLCALC 81 05/01/2023 1702   LDLDIRECT 138.1 09/03/2007 1118    History of Present Illness    68 year old male with a history of NSTEMI, nonobstructive CAD, possible myopericarditis, chronic diastolic heart failure, hypertension, hyperlipidemia,  CVA, CKD stage IIIa, hypothyroidism, ED, psoriasis,anxiety, depression, and GERD who presents for follow-up related to CAD and hypertension.   He was hospitalized in 01/2023 in the setting of NSTEMI.  Cardiac catheterization on 02/12/2023 revealed mild nonobstructive CAD, elevated LVEDP.  He reported nonadherence to his  medication regimen prior to admission.  Echocardiogram showed EF 55 to 60%, akinetic apical anterior segment, G1 DD.  He received IV Lasix.  CRP was elevated. Cardiac MRI was deferred.  He was started on colchicine 0.6 mg daily given concern for myopericarditis.  Otherwise, his home medications were resumed. He was last seen in the office on 05/01/2023 and was stable from a cardiac standpoint. He contacted our office in July 2024 with concern for fatigue, myalgias.  He was advised to stop colchicine given increased risk for rhabdomyolysis with chronic combined atorvastatin.  He never discontinued this medication.  Additionally, carvedilol was decreased to 12.5 mg twice daily.  He was hospitalized in 04/2023 in the setting of CVA.  Echocardiogram at the time showed EF 55 to 60%, normal LV function, no RWMA, mild LVH, G1 DD, normal RV systolic function, no significant valvular abnormalities, mild dilation of the ascending aorta measuring 42 mm, no significant change from prior study.  Outpatient cardiac monitor revealed minimum heart rate 48 bpm, maximal heart rate 112 bpm, average heart rate 69 bpm, predominantly sinus rhythm, PACs and PVCs, 1 pause lasting 3.9 seconds, no significant abnormalities.  Discontinuation of beta-blocker therapy was advised.   He presents today for follow-up. Since his last visit he has been stable overall from a cardiac standpoint.  Over the past several weeks he has been dealing with hypotension and generalized fatigue. His primary care doctor decreased his losartan to 50 mg daily.  His BP has since normalized.  He denies any palpitations, presyncope, syncope.  Has noted occasional lightheadedness.  He denies chest pain, dyspnea, edema, PND, orthopnea, weight gain.  He has noticed some feelings of anxiety and depression. He admits to drinking a significant amount of alcohol, he stopped cold [place] and felt poorly, he feels this may have contributed to his symptoms of fatigue.  He denies  symptoms concerning for angina, denies palpitations, dizziness, presyncope, syncope, dyspnea, edema, PND, orthopnea, weight gain.  Home Medications    Current Outpatient Medications  Medication Sig Dispense Refill   acetaminophen (TYLENOL) 500 MG tablet Take 500 mg by mouth every 6 (six) hours as needed for moderate pain.     ALPRAZolam (XANAX) 1 MG tablet Take 1 tablet (1 mg total) by mouth every 6 (six) hours as needed for anxiety. for anxiety 120 tablet 5   aspirin 81 MG chewable tablet Chew 1 tablet (81 mg total) by mouth daily.     atorvastatin (LIPITOR) 80 MG tablet Take 1 tablet (80 mg total) by mouth daily. 90 tablet 3   carvedilol (COREG) 6.25 MG tablet Take 1 tablet (6.25 mg total) by mouth 2 (two) times daily. 180 tablet 3   clopidogrel (PLAVIX) 75 MG tablet Take 1 tablet (75 mg total) by mouth daily. 90 tablet 3   losartan (COZAAR) 100 MG tablet Take 0.5 tablets (50 mg total) by mouth daily.     ondansetron (ZOFRAN) 8 MG tablet Take 1 tablet (8 mg total) by mouth every 8 (eight) hours as needed for nausea or vomiting. 60 tablet 2   testosterone cypionate (DEPOTESTOSTERONE CYPIONATE) 200 MG/ML injection INJECT 1 ML INTRAMUSCULARLY EVERY 7 DAYS 4 mL 3   triamcinolone cream (KENALOG) 0.1 %  Apply 1 Application topically 2 (two) times daily as needed. (Patient taking differently: Apply 1 Application topically 2 (two) times daily as needed (itching).) 45 g 5   No current facility-administered medications for this visit.     Review of Systems    He denies chest pain, palpitations, dyspnea, pnd, orthopnea, n, v, dizziness, syncope, edema, weight gain, or early satiety. All other systems reviewed and are otherwise negative except as noted above.   Physical Exam    VS:  BP (!) 120/90 (Cuff Size: Large)   Pulse 84   Ht 5\' 8"  (1.727 m)   Wt 269 lb 12.8 oz (122.4 kg)   SpO2 94%   BMI 41.02 kg/m   GEN: Well nourished, well developed, in no acute distress. HEENT: normal. Neck: Supple,  no JVD, carotid bruits, or masses. Cardiac: RRR, no murmurs, rubs, or gallops. No clubbing, cyanosis, edema.  Radials/DP/PT 2+ and equal bilaterally.  Respiratory:  Respirations regular and unlabored, clear to auscultation bilaterally. GI: Soft, nontender, nondistended, BS + x 4. MS: no deformity or atrophy. Skin: warm and dry, no rash. Neuro:  Strength and sensation are intact. Psych: Normal affect.  Accessory Clinical Findings    ECG personally reviewed by me today -    - no EKG in office today.    Lab Results  Component Value Date   WBC 7.5 05/17/2023   HGB 15.6 05/17/2023   HCT 47.9 05/17/2023   MCV 95.8 05/17/2023   PLT 279 05/17/2023   Lab Results  Component Value Date   CREATININE 1.54 (H) 05/17/2023   BUN 9 05/16/2023   NA 135 05/16/2023   K 4.0 05/16/2023   CL 101 05/16/2023   CO2 22 05/16/2023   Lab Results  Component Value Date   ALT 33 05/16/2023   AST 34 05/16/2023   ALKPHOS 66 05/16/2023   BILITOT 1.8 (H) 05/16/2023   Lab Results  Component Value Date   CHOL 174 05/17/2023   HDL 28 (L) 05/17/2023   LDLCALC 116 (H) 05/17/2023   LDLDIRECT 138.1 09/03/2007   TRIG 152 (H) 05/17/2023   CHOLHDL 6.2 05/17/2023    Lab Results  Component Value Date   HGBA1C 5.9 (H) 05/17/2023    Assessment & Plan    1. Nonobstructive CAD: Cath in 02/12/2023 revealed mild nonobstructive CAD, elevated LVEDP. Stable with no anginal symptoms. Will update CBC, CMET. Continue Aspirin, Plavix, carvedilol as below, losartan, and Lipitor.    2. Cryptogenic stroke:  Hospitalized in 04/2023 in the setting of acute CVA. Echocardiogram at the time showed EF 55 to 60%, normal LV function, no RWMA, mild LVH, G1 DD, normal RV systolic function, no significant valvular abnormalities, mild dilation of the ascending aorta measuring 42 mm, no significant change from prior study.  Outpatient cardiac monitor revealed minimum heart rate 48 bpm, maximal heart rate 112 bpm, average heart rate 69 bpm,  predominantly sinus rhythm, PACs and PVCs, 1 pause lasting 3.9 seconds, no significant abnormalities. No recurrence or residual deficit.     3. Chronic diastolic heart failure: Most recent echo as above. Euvolemic and well compensated on exam.  No indication for loop diuretic at this time.  Continue carvedilol as below, losartan.   4. Sinus bradycardia/sinus pause: Noted on most recent cardiac monitor as above.  Will decrease carvedilol to 3.125 mg twice daily.  He denies any recent dizziness, presyncope or syncope.    5. Hypertension: Losartan was recently decreased to 50 mg daily in the setting of  hypotension.  Will decrease carvedilol as above.  BP currently  well controlled. Continue to monitor BP and report BP consistently greater than 140/80, SBP consistently less than 100.  Recommend he purchase a blood pressure cuff, provided recommendations for [employer].   5. Hyperlipidemia: LDL was 116 in 04/2023, above goal.  Update fasting lipid panel, CMET if LDL remains elevated above goal, consider referral to lipid clinic Pharm.D. for consideration of PCSK9 inhibitor.  Continue Lipitor.   6. CKD stage IIIa: Creatinine was 1.54 in 04/2023. Repeat CMET pending as above.   7. Hypothyroidism: TSH was 2.26 in 04/2023.  Will repeat TSH.  8. Anxiety/Depression/ETOH use: Reports feelings of both anxiety and depression.  Currently only taking Xanax to manage her symptoms.  We discussed possibility of counseling.  Has seen a counselor in the past and found this helpful.  He has been drinking a significant amount of alcohol recently.  Advised him to limit alcohol consumption to no more than 2 drinks per day.  Recommend follow-up with PCP, continue to monitor.  9. Disposition: Follow-up -up in 4 to 6 weeks.   Joylene Grapes, NP 10/22/2023, 4:17 PM

## 2023-10-21 NOTE — Telephone Encounter (Signed)
Pt was seen by Dr Clent Ridges for Hypotension on 10/20/23

## 2023-10-22 ENCOUNTER — Encounter: Payer: Self-pay | Admitting: Nurse Practitioner

## 2023-10-22 ENCOUNTER — Ambulatory Visit: Payer: Medicare Other | Attending: Nurse Practitioner | Admitting: Nurse Practitioner

## 2023-10-22 VITALS — BP 120/90 | HR 84 | Ht 68.0 in | Wt 269.8 lb

## 2023-10-22 DIAGNOSIS — E039 Hypothyroidism, unspecified: Secondary | ICD-10-CM

## 2023-10-22 DIAGNOSIS — N1831 Chronic kidney disease, stage 3a: Secondary | ICD-10-CM

## 2023-10-22 DIAGNOSIS — I2510 Atherosclerotic heart disease of native coronary artery without angina pectoris: Secondary | ICD-10-CM

## 2023-10-22 DIAGNOSIS — E785 Hyperlipidemia, unspecified: Secondary | ICD-10-CM

## 2023-10-22 DIAGNOSIS — I10 Essential (primary) hypertension: Secondary | ICD-10-CM | POA: Diagnosis not present

## 2023-10-22 DIAGNOSIS — I639 Cerebral infarction, unspecified: Secondary | ICD-10-CM

## 2023-10-22 DIAGNOSIS — F418 Other specified anxiety disorders: Secondary | ICD-10-CM

## 2023-10-22 DIAGNOSIS — I5032 Chronic diastolic (congestive) heart failure: Secondary | ICD-10-CM | POA: Diagnosis not present

## 2023-10-22 MED ORDER — CARVEDILOL 3.125 MG PO TABS: 3.1250 mg | ORAL_TABLET | Freq: Two times a day (BID) | ORAL | 3 refills | Status: DC

## 2023-10-22 MED ORDER — LOSARTAN POTASSIUM 50 MG PO TABS: 50.0000 mg | ORAL_TABLET | Freq: Every day | ORAL | 3 refills | Status: DC

## 2023-10-22 NOTE — Telephone Encounter (Signed)
Pt was seen by Dr Clent Ridges on 10/20/23 for this problem

## 2023-10-22 NOTE — Patient Instructions (Signed)
Medication Instructions:  Decrease Carvedilol 3.125 mg twice daily Decrease Losartan 50 mg twice daily  *If you need a refill on your cardiac medications before your next appointment, please call your pharmacy*   Lab Work: Your physician recommends that you return for lab work at your convenience.  Fasting Lipid Panel, CMET, CBC, TSH  Testing/Procedures: NONE ordered at this time of appointment   Follow-Up: At [facility], you and your health needs are our priority.  As part of our continuing mission to provide you with exceptional heart care, we have created designated Provider Care Teams.  These Care Teams include your primary Cardiologist (physician) and Advanced Practice Providers (APPs -  Physician Assistants and Nurse Practitioners) who all work together to provide you with the care you need, when you need it.  We recommend signing up for the patient portal called "MyChart".  Sign up information is provided on this After Visit Summary.  MyChart is used to connect with patients for Virtual Visits (Telemedicine).  Patients are able to view lab/test results, encounter notes, upcoming appointments, etc.  Non-urgent messages can be sent to your provider as well.   To learn more about what you can do with MyChart, go to [email or web address].    Your next appointment:   4-6 week(s)  Provider:   Bernadene Person, NP

## 2023-10-23 ENCOUNTER — Encounter: Payer: Self-pay | Admitting: Nurse Practitioner

## 2023-10-23 ENCOUNTER — Encounter: Payer: Self-pay | Admitting: Family Medicine

## 2023-10-28 ENCOUNTER — Encounter: Payer: Self-pay | Admitting: Family Medicine

## 2023-10-29 NOTE — Telephone Encounter (Signed)
 This has not been read yet. Please explain to him the problems we are having with Radiology

## 2023-11-02 ENCOUNTER — Other Ambulatory Visit: Payer: Self-pay | Admitting: Family Medicine

## 2023-11-04 ENCOUNTER — Encounter: Payer: Self-pay | Admitting: Family Medicine

## 2023-11-09 ENCOUNTER — Other Ambulatory Visit: Payer: Self-pay | Admitting: Family Medicine

## 2023-11-09 ENCOUNTER — Telehealth: Payer: Self-pay

## 2023-11-09 NOTE — Telephone Encounter (Signed)
 Copied from CRM [id number]. Topic: Clinical - Medication Refill >> Nov 09, 2023  8:02 AM Almira Coaster wrote: Most Recent Primary Care Visit:  Provider: Gershon Crane A  Department: [facility]  Visit Type: ACUTE  Date: 10/20/2023  Medication: ALPRAZolam Prudy Feeler) 1 MG tablet   Has the patient contacted their pharmacy? Yes, pharmacy has sent a request and have not gotten a response (Agent: If no, request that the patient contact the pharmacy for the refill. If patient does not wish to contact the pharmacy document the reason why and proceed with request.) (Agent: If yes, when and what did the pharmacy advise?)  Is this the correct pharmacy for this prescription? Yes If no, delete pharmacy and type the correct one.  This is the patient's preferred pharmacy:  [facility] [id number] [place], [place] - [address] [place] [address] Phone: [phone] Fax: [phone]   Has the prescription been filled recently? No  Is the patient out of the medication? Yes  Has the patient been seen for an appointment in the last year OR does the patient have an upcoming appointment? Yes  Can we respond through MyChart? Yes  Agent: Please be advised that Rx refills may take up to 3 business days. We ask that you follow-up with your pharmacy.

## 2023-11-09 NOTE — Telephone Encounter (Signed)
 Copied from CRM [id number]. Topic: Clinical - Medication Question >> Nov 09, 2023  8:06 AM Richard Sellers wrote: Reason for CRM: Patient is very anxious about his ALPRAZolam Prudy Feeler) 1 MG tablet prescription, states he pharmacy has submitted a request and he also sent a MyChart message and has not heard anything back. He would like to speak with someone from the office on why it's taking so long for his prescription to be sent to the pharmacy.

## 2023-11-10 ENCOUNTER — Ambulatory Visit: Payer: Self-pay | Admitting: Family Medicine

## 2023-11-10 NOTE — Telephone Encounter (Signed)
 Copied from CRM [id number]. Topic: Clinical - Red Word Triage >> Nov 10, 2023  8:04 AM Deaijah H wrote: Red Word that prompted transfer to Nurse Triage: Medication discussion with Dr.Fry / anxiety level high / not sleeping well both getting worse   Chief Complaint: Anxiety Symptoms: Restless, Anxious Frequency: Acute Pertinent Negatives: Patient denies chest pain, dyspnea,  Disposition: [] ED /[] Urgent Care (no appt availability in office) / [x] Appointment(In office/virtual)/ []  [facility] [facility]/ [] Home Care/ [] Refused Recommended Disposition /[] [place] [facility]/ []  Follow-up with PCP Additional Notes: JM is being triaged for worsening anxiety and running out of medication. The patient is reporting running out of medication and that he has been experiencing restlessness and trouble sleeping. The patient has a history of chronic anxiety and treatment. The patient states that he is unaware of the situation regarding his medication, and this is causing him more anxiety. The patient is additionally requesting that information regarding his prescription of Xanax. Please contact patient regarding this per patient request.   Reason for Disposition  Patient sounds very upset or troubled to the triager  Answer Assessment - Initial Assessment Questions 1. CONCERN: "Did anything happen that prompted you to call today?"      Out of medication, Anxiety getting worse, unable to sleep for the past couple of days.  2. ANXIETY SYMPTOMS: "Can you describe how you (your loved one; patient) have been feeling?" (e.g., tense, restless, panicky, anxious, keyed up, overwhelmed, sense of impending doom).      Restless, anxious  3. ONSET: "How long have you been feeling this way?" (e.g., hours, days, weeks)     Since Last Monday  4. SEVERITY: "How would you rate the level of anxiety?" (e.g., 0 - 10; or mild, moderate, severe).     Moderate-Severe  5. FUNCTIONAL IMPAIRMENT: "How have these feelings  affected your ability to do daily activities?" "Have you had more difficulty than usual doing your normal daily activities?" (e.g., getting better, same, worse; self-care, school, work, interactions)     Getting worse  6. HISTORY: "Have you felt this way before?" "Have you ever been diagnosed with an anxiety problem in the past?" (e.g., generalized anxiety disorder, panic attacks, PTSD). If Yes, ask: "How was this problem treated?" (e.g., medicines, counseling, etc.)     History of anxiety  7. RISK OF HARM - SUICIDAL IDEATION: "Do you ever have thoughts of hurting or killing yourself?" If Yes, ask:  "Do you have these feelings now?" "Do you have a plan on how you would do this?"     No  8. TREATMENT:  "What has been done so far to treat this anxiety?" (e.g., medicines, relaxation strategies). "What has helped?"     Patient is on a medication regimen, and is running out of medication.   9. TREATMENT - THERAPIST: "Do you have a counselor or therapist? Name?"     No  10. POTENTIAL TRIGGERS: "Do you drink caffeinated beverages (e.g., coffee, colas, teas), and how much daily?" "Do you drink alcohol or use any drugs?" "Have you started any new medicines recently?"       No new intakes  11. PATIENT SUPPORT: "Who is with you now?" "Who do you live with?" "Do you have family or friends who you can talk to?"        Roommate  12. OTHER SYMPTOMS: "Do you have any other symptoms?" (e.g., feeling depressed, trouble concentrating, trouble sleeping, trouble breathing, palpitations or fast heartbeat, chest pain, sweating, nausea, or diarrhea)  Trouble sleeping, nausea, loss of appetite  Protocols used: Anxiety and Panic Attack-A-AH

## 2023-11-11 ENCOUNTER — Ambulatory Visit: Payer: Medicare Other | Admitting: Family Medicine

## 2023-11-11 NOTE — Telephone Encounter (Signed)
 Patient has an appt sch with Dr. Clent Ridges 2/21

## 2023-11-13 ENCOUNTER — Ambulatory Visit: Payer: Medicare Other | Admitting: Family Medicine

## 2023-11-13 NOTE — Telephone Encounter (Signed)
 This Rx was sent to pt pharmacy on 11/10/23. Pt notified

## 2023-11-16 NOTE — Telephone Encounter (Signed)
 This was filled on 11/10/23

## 2023-12-07 ENCOUNTER — Telehealth: Payer: Self-pay | Admitting: Family Medicine

## 2023-12-07 DIAGNOSIS — I2510 Atherosclerotic heart disease of native coronary artery without angina pectoris: Secondary | ICD-10-CM | POA: Diagnosis not present

## 2023-12-07 DIAGNOSIS — E039 Hypothyroidism, unspecified: Secondary | ICD-10-CM | POA: Diagnosis not present

## 2023-12-07 DIAGNOSIS — I639 Cerebral infarction, unspecified: Secondary | ICD-10-CM | POA: Diagnosis not present

## 2023-12-07 DIAGNOSIS — I10 Essential (primary) hypertension: Secondary | ICD-10-CM | POA: Diagnosis not present

## 2023-12-07 DIAGNOSIS — E785 Hyperlipidemia, unspecified: Secondary | ICD-10-CM | POA: Diagnosis not present

## 2023-12-07 DIAGNOSIS — I5032 Chronic diastolic (congestive) heart failure: Secondary | ICD-10-CM | POA: Diagnosis not present

## 2023-12-07 LAB — CBC

## 2023-12-07 NOTE — Telephone Encounter (Signed)
 Copied from CRM [id number]. Topic: General - Call Back - No Documentation >> Dec 07, 2023 10:32 AM Richard Sellers wrote: Reason for CRM: Patient returning missed phone call - no notes in chart.

## 2023-12-08 LAB — LIPID PANEL
Chol/HDL Ratio: 3.2 ratio (ref 0.0–5.0)
Cholesterol, Total: 144 mg/dL (ref 100–199)
HDL: 45 mg/dL (ref >39)
LDL Chol Calc (NIH): 79 mg/dL (ref 0–99)
Triglycerides: 110 mg/dL (ref 0–149)
VLDL Cholesterol Cal: 20 mg/dL (ref 5–40)

## 2023-12-08 LAB — CBC
Hematocrit: 41.6 % (ref 37.5–51.0)
Hemoglobin: 14.3 g/dL (ref 13.0–17.7)
MCH: 31.8 pg (ref 26.6–33.0)
MCHC: 34.4 g/dL (ref 31.5–35.7)
MCV: 93 fL (ref 79–97)
Platelets: 295 10*3/uL (ref 150–450)
RBC: 4.49 x10E6/uL (ref 4.14–5.80)
RDW: 12.5 % (ref 11.6–15.4)
WBC: 7.3 10*3/uL (ref 3.4–10.8)

## 2023-12-08 LAB — COMPREHENSIVE METABOLIC PANEL
ALT: 28 IU/L (ref 0–44)
AST: 28 IU/L (ref 0–40)
Albumin: 4.2 g/dL (ref 3.9–4.9)
Alkaline Phosphatase: 107 IU/L (ref 44–121)
BUN/Creatinine Ratio: 12 (ref 10–24)
BUN: 17 mg/dL (ref 8–27)
Bilirubin Total: 0.7 mg/dL (ref 0.0–1.2)
CO2: 23 mmol/L (ref 20–29)
Calcium: 9.4 mg/dL (ref 8.6–10.2)
Chloride: 101 mmol/L (ref 96–106)
Creatinine, Ser: 1.45 mg/dL — ABNORMAL HIGH (ref 0.76–1.27)
Globulin, Total: 2.7 g/dL (ref 1.5–4.5)
Glucose: 114 mg/dL — ABNORMAL HIGH (ref 70–99)
Potassium: 4.6 mmol/L (ref 3.5–5.2)
Sodium: 137 mmol/L (ref 134–144)
Total Protein: 6.9 g/dL (ref 6.0–8.5)
eGFR: 53 mL/min/{1.73_m2} — ABNORMAL LOW (ref >59)

## 2023-12-08 LAB — TSH: TSH: 3.35 u[IU]/mL (ref 0.450–4.500)

## 2023-12-18 ENCOUNTER — Ambulatory Visit: Payer: Medicare Other | Attending: Nurse Practitioner | Admitting: Nurse Practitioner

## 2023-12-18 ENCOUNTER — Other Ambulatory Visit: Payer: Self-pay

## 2023-12-18 VITALS — BP 110/80 | HR 86 | Ht 68.0 in | Wt 281.0 lb

## 2023-12-18 DIAGNOSIS — E039 Hypothyroidism, unspecified: Secondary | ICD-10-CM

## 2023-12-18 DIAGNOSIS — I5032 Chronic diastolic (congestive) heart failure: Secondary | ICD-10-CM

## 2023-12-18 DIAGNOSIS — I639 Cerebral infarction, unspecified: Secondary | ICD-10-CM | POA: Diagnosis not present

## 2023-12-18 DIAGNOSIS — I2510 Atherosclerotic heart disease of native coronary artery without angina pectoris: Secondary | ICD-10-CM

## 2023-12-18 DIAGNOSIS — I10 Essential (primary) hypertension: Secondary | ICD-10-CM

## 2023-12-18 DIAGNOSIS — F418 Other specified anxiety disorders: Secondary | ICD-10-CM

## 2023-12-18 DIAGNOSIS — R001 Bradycardia, unspecified: Secondary | ICD-10-CM

## 2023-12-18 DIAGNOSIS — N1831 Chronic kidney disease, stage 3a: Secondary | ICD-10-CM

## 2023-12-18 DIAGNOSIS — E785 Hyperlipidemia, unspecified: Secondary | ICD-10-CM

## 2023-12-18 DIAGNOSIS — F1090 Alcohol use, unspecified, uncomplicated: Secondary | ICD-10-CM

## 2023-12-18 MED ORDER — EZETIMIBE 10 MG PO TABS: 10.0000 mg | ORAL_TABLET | Freq: Every day | ORAL | 3 refills | Status: AC

## 2023-12-18 NOTE — Patient Instructions (Addendum)
 Medication Instructions:  Start Zetia 10 mg daily  *If you need a refill on your cardiac medications before your next appointment, please call your pharmacy*  Lab Work: Fasting Lipid Panel & CMET in 6-8 weeks.  Testing/Procedures: NONE ordered at this time of appointment    Follow-Up: At [facility], you and your health needs are our priority.  As part of our continuing mission to provide you with exceptional heart care, our providers are all part of one team.  This team includes your primary Cardiologist (physician) and Advanced Practice Providers or APPs (Physician Assistants and Nurse Practitioners) who all work together to provide you with the care you need, when you need it.  Your next appointment:   4-6 month  Provider:   Thomasene Ripple, DO  or Bernadene Person, NP        We recommend signing up for the patient portal called "MyChart".  Sign up information is provided on this After Visit Summary.  MyChart is used to connect with patients for Virtual Visits (Telemedicine).  Patients are able to view lab/test results, encounter notes, upcoming appointments, etc.  Non-urgent messages can be sent to your provider as well.   To learn more about what you can do with MyChart, go to [email or web address].   Other Instructions       1st Floor: - Lobby - Registration  - Pharmacy  - Lab - Cafe  2nd Floor: - PV Lab - Diagnostic Testing (echo, CT, nuclear med)  3rd Floor: - Vacant  4th Floor: - TCTS (cardiothoracic surgery) - AFib Clinic - Structural Heart Clinic - Vascular Surgery  - Vascular Ultrasound  5th Floor: - HeartCare Cardiology (general and EP) - Clinical Pharmacy for coumadin, hypertension, lipid, weight-loss medications, and med management appointments    Valet parking services will be available as well.

## 2023-12-18 NOTE — Progress Notes (Signed)
 Office Visit    Patient Name: Richard Sellers Date of Encounter: 12/18/2023  Primary Care Provider:  Nelwyn Salisbury, MD Primary Cardiologist:  Thomasene Ripple, DO  Chief Complaint    68 year old male with a history of NSTEMI, nonobstructive CAD, possible myopericarditis, chronic diastolic heart failure, hypertension, hyperlipidemia,  CVA, CKD stage IIIa, hypothyroidism, ED, psoriasis, anxiety, depression, and GERD who presents for follow-up related to CAD and hypertension.   Past Medical History    Past Medical History:  Diagnosis Date   Allergy    seasonal allergies   Anxiety    on meds   Chronic kidney disease    hx of kidney stones   Depression    on meds   ED (erectile dysfunction)    GERD (gastroesophageal reflux disease)    not on meds-diet controlled-uses OTC meds for tx   Heart murmur    at age 74 years old, heard x1 , never again   History of kidney stones    Hyperlipidemia    on meds   Hypertension    on meds   Hypothyroidism    hx of-was on meds-   PONV (postoperative nausea and vomiting)    Warts, genital    Past Surgical History:  Procedure Laterality Date   APPENDECTOMY  1975   COLONOSCOPY  11/08/2007   per Dr. Jarold Motto, diverticulosis but no polyps, repeat in 10 yrs    CYSTECTOMY     EXTRACORPOREAL SHOCK WAVE LITHOTRIPSY Left 10/29/2017   Procedure: LEFT EXTRACORPOREAL SHOCK WAVE LITHOTRIPSY (ESWL);  Surgeon: Marcine Matar, MD;  Location: [facility];  Service: Urology;  Laterality: Left;   HERNIA REPAIR  07/2017   umbilical hernia   KNEE ARTHROSCOPY Right 1993   LEFT HEART CATH AND CORONARY ANGIOGRAPHY N/A 02/12/2023   Procedure: LEFT HEART CATH AND CORONARY ANGIOGRAPHY;  Surgeon: [place], Peter M, MD;  Location: [facility] INVASIVE CV LAB;  Service: Cardiovascular;  Laterality: N/A;   TONSILLECTOMY     WISDOM TOOTH EXTRACTION  1990    Allergies  No Known Allergies   Labs/Other Studies Reviewed    The following studies were reviewed  today:  Cardiac Studies & Procedures   ______________________________________________________________________________________________ CARDIAC CATHETERIZATION  CARDIAC CATHETERIZATION 02/12/2023  Narrative   Prox RCA to Mid RCA lesion is 20% stenosed.   LV end diastolic pressure is moderately elevated.  Mild nonobstructive CAD Moderately elevated LVEDP 28 mm Hg  Plan: patient has MINOCA possibly related to severe uncontrolled HTN. LVEDP is elevated. Will manage medically.  Findings Coronary Findings Diagnostic  Dominance: Right  Left Main Vessel was injected. Vessel is normal in caliber. Vessel is angiographically normal.  Left Anterior Descending Vessel was injected. Vessel is normal in caliber. The vessel exhibits minimal luminal irregularities.  Ramus Intermedius Vessel was injected. Vessel is small. Vessel is angiographically normal.  Left Circumflex Vessel was injected. Vessel is normal in caliber. The vessel exhibits minimal luminal irregularities.  Right Coronary Artery Prox RCA to Mid RCA lesion is 20% stenosed.  Intervention  No interventions have been documented.     ECHOCARDIOGRAM  ECHOCARDIOGRAM COMPLETE 05/17/2023  Narrative ECHOCARDIOGRAM REPORT    Patient Name:   Richard Sellers Date of Exam: 05/17/2023 Medical Rec #:  [id number]        Height:       68.0 in Accession #:    [id number]       Weight:       281.1 lb Date of Birth:  05-Aug-1956  BSA:          2.362 m Patient Age:    66 years         BP:           127/86 mmHg Patient Gender: M                HR:           70 bpm. Exam Location:  Inpatient  Procedure: 2D Echo, Cardiac Doppler and Color Doppler  Indications:    stroke  History:        Patient has prior history of Echocardiogram examinations, most recent 02/11/2023. Signs/Symptoms:Edema; Risk Factors:Current Smoker and Hypertension.  Sonographer:    Delcie Roch RDCS Referring Phys: [id number] ALEXIS  HUGELMEYER   Sonographer Comments: Patient is obese. Image acquisition challenging due to patient body habitus. IMPRESSIONS   1. Left ventricular ejection fraction, by estimation, is 55 to 60%. Left ventricular ejection fraction by PLAX is 57 %. The left ventricle has normal function. The left ventricle has no regional wall motion abnormalities. There is mild left ventricular hypertrophy. Left ventricular diastolic parameters are consistent with Grade I diastolic dysfunction (impaired relaxation). 2. Right ventricular systolic function is normal. The right ventricular size is normal. Tricuspid regurgitation signal is inadequate for assessing PA pressure. 3. Left atrial size was mildly dilated. 4. Right atrial size was mildly dilated. 5. The mitral valve is grossly normal. No evidence of mitral valve regurgitation. 6. The aortic valve is tricuspid. Aortic valve regurgitation is not visualized. 7. Aortic dilatation noted. There is mild dilatation of the ascending aorta, measuring 42 mm. 8. The inferior vena cava is normal in size with greater than 50% respiratory variability, suggesting right atrial pressure of 3 mmHg.  Comparison(s): No significant change from prior study. 02/11/2023: LVEF 55-60%, mild biatrial enlargement.  FINDINGS Left Ventricle: Left ventricular ejection fraction, by estimation, is 55 to 60%. Left ventricular ejection fraction by PLAX is 57 %. The left ventricle has normal function. The left ventricle has no regional wall motion abnormalities. The left ventricular internal cavity size was normal in size. There is mild left ventricular hypertrophy. Left ventricular diastolic parameters are consistent with Grade I diastolic dysfunction (impaired relaxation). Indeterminate filling pressures.  Right Ventricle: The right ventricular size is normal. No increase in right ventricular wall thickness. Right ventricular systolic function is normal. Tricuspid regurgitation signal is  inadequate for assessing PA pressure.  Left Atrium: Left atrial size was mildly dilated.  Right Atrium: Right atrial size was mildly dilated.  Pericardium: There is no evidence of pericardial effusion.  Mitral Valve: The mitral valve is grossly normal. No evidence of mitral valve regurgitation.  Tricuspid Valve: The tricuspid valve is normal in structure. Tricuspid valve regurgitation is not demonstrated.  Aortic Valve: The aortic valve is tricuspid. Aortic valve regurgitation is not visualized.  Pulmonic Valve: The pulmonic valve was normal in structure. Pulmonic valve regurgitation is not visualized.  Aorta: Aortic dilatation noted. There is mild dilatation of the ascending aorta, measuring 42 mm.  Venous: The inferior vena cava is normal in size with greater than 50% respiratory variability, suggesting right atrial pressure of 3 mmHg.  IAS/Shunts: No atrial level shunt detected by color flow Doppler.   LEFT VENTRICLE PLAX 2D LV EF:         Left            Diastology ventricular     LV e' medial:    8.38 cm/s ejection  LV E/e' medial:  9.8 fraction by     LV e' lateral:   7.18 cm/s PLAX is 57      LV E/e' lateral: 11.4 %. LVIDd:         5.00 cm LVIDs:         3.50 cm LV PW:         1.10 cm LV IVS:        1.10 cm LVOT diam:     1.90 cm LV SV:         69 LV SV Index:   29 LVOT Area:     2.84 cm   RIGHT VENTRICLE             IVC RV Basal diam:  3.00 cm     IVC diam: 1.60 cm RV S prime:     20.30 cm/s TAPSE (M-mode): 2.0 cm  LEFT ATRIUM             Index        RIGHT ATRIUM           Index LA diam:        3.40 cm 1.44 cm/m   RA Area:     13.80 cm LA Vol (A2C):   48.5 ml 20.54 ml/m  RA Volume:   31.50 ml  13.34 ml/m LA Vol (A4C):   42.8 ml 18.12 ml/m LA Biplane Vol: 45.9 ml 19.43 ml/m AORTIC VALVE LVOT Vmax:   127.00 cm/s LVOT Vmean:  84.400 cm/s LVOT VTI:    0.242 m  AORTA Ao Root diam: 3.30 cm Ao Asc diam:  4.20 cm  MITRAL VALVE MV Area (PHT):  2.83 cm    SHUNTS MV Decel Time: 268 msec    Systemic VTI:  0.24 m MV E velocity: 81.90 cm/s  Systemic Diam: 1.90 cm MV A velocity: 99.40 cm/s MV E/A ratio:  0.82  Zoila Shutter MD Electronically signed by Zoila Shutter MD Signature Date/Time: 05/17/2023/11:33:52 AM    Final    MONITORS  CARDIAC EVENT MONITOR 07/03/2023  Narrative The patient wore the monitor for 30  days  starting 06/01/2023.  . Indication: Cryptogenic stroke  The minimum heart rate was 48 bpm, maximum heart rate was 112 bpm, and average heart rate was 69  bpm. Predominant underlying rhythm was Sinus Rhythm.   Premature atrial complexes were rare. Premature Ventricular complexes rare.  One pause noted lasting 3.9 seconds.  No AV block and no atrial fibrillation present.  Patient triggered events associated with sinus rhythm.  Conclusion: This study is showed evidence of 3.9 seconds pause. This pause is in the setting of low dose beta blocker.  Asymptomatic will optimize medication and stop beta blocker.       ______________________________________________________________________________________________     Recent Labs: 02/13/2023: Magnesium 1.8 12/07/2023: ALT 28; BUN 17; Creatinine, Ser 1.45; Hemoglobin 14.3; Platelets 295; Potassium 4.6; Sodium 137; TSH 3.350  Recent Lipid Panel    Component Value Date/Time   CHOL 144 12/07/2023 1113   TRIG 110 12/07/2023 1113   HDL 45 12/07/2023 1113   CHOLHDL 3.2 12/07/2023 1113   CHOLHDL 6.2 05/17/2023 0452   VLDL 30 05/17/2023 0452   LDLCALC 79 12/07/2023 1113   LDLDIRECT 138.1 09/03/2007 1118    History of Present Illness    68 year old male with a history of NSTEMI, nonobstructive CAD, possible myopericarditis, chronic diastolic heart failure, hypertension, hyperlipidemia,  CVA, CKD stage IIIa, hypothyroidism, ED, psoriasis,anxiety, depression, and GERD who presents for follow-up  related to CAD and hypertension.    He was hospitalized in 01/2023 in  the setting of NSTEMI.  Cardiac catheterization on 02/12/2023 revealed mild nonobstructive CAD, elevated LVEDP.  He reported nonadherence to his medication regimen prior to admission.  Echocardiogram showed EF 55 to 60%, akinetic apical anterior segment, G1 DD.  He received IV Lasix.  CRP was elevated. Cardiac MRI was deferred.  He was started on colchicine 0.6 mg daily given concern for myopericarditis.  Otherwise, his home medications were resumed. He contacted our office in July 2024 with concern for fatigue, myalgias.  He was advised to stop colchicine given increased risk for rhabdomyolysis with chronic combined atorvastatin.  He never discontinued this medication.  Additionally, carvedilol was decreased to 12.5 mg twice daily.  He was hospitalized in 04/2023 in the setting of CVA.  Echocardiogram at the time showed EF 55 to 60%, normal LV function, no RWMA, mild LVH, G1 DD, normal RV systolic function, no significant valvular abnormalities, mild dilation of the ascending aorta measuring 42 mm, no significant change from prior study.  Outpatient cardiac monitor revealed minimum heart rate 48 bpm, maximal heart rate 112 bpm, average heart rate 69 bpm, predominantly sinus rhythm, PACs and PVCs, 1 pause lasting 3.9 seconds, no significant abnormalities. He was last seen in the office on 10/22/2023 and was stable from a cardiac standpoint.  Carvedilol was decreased to 3.125 mg twice daily.  BP was stable.   He presents today for follow-up. Since his last visit he has been stable overall from a cardiac standpoint.  He denies any chest pain, dyspnea, edema, PND, orthopnea, weight gain.  He continues to struggle with anxiety, depression, alcohol use.  He states that he snores, he has not been sleeping well, however, he states that he is not interested in CPAP therapy.   Home Medications    Current Outpatient Medications  Medication Sig Dispense Refill   acetaminophen (TYLENOL) 500 MG tablet Take 500 mg by mouth  every 6 (six) hours as needed for moderate pain.     ALPRAZolam (XANAX) 1 MG tablet TAKE ONE TABLET BY MOUTH EVERY 6 HOURS AS NEEDED FOR ANXIETY 120 tablet 5   aspirin 81 MG chewable tablet Chew 1 tablet (81 mg total) by mouth daily.     atorvastatin (LIPITOR) 80 MG tablet Take 1 tablet (80 mg total) by mouth daily. 90 tablet 3   carvedilol (COREG) 3.125 MG tablet Take 1 tablet (3.125 mg total) by mouth 2 (two) times daily. 180 tablet 3   clopidogrel (PLAVIX) 75 MG tablet Take 1 tablet (75 mg total) by mouth daily. 90 tablet 3   losartan (COZAAR) 50 MG tablet Take 1 tablet (50 mg total) by mouth daily. 90 tablet 3   ondansetron (ZOFRAN) 8 MG tablet Take 1 tablet (8 mg total) by mouth every 8 (eight) hours as needed for nausea or vomiting. 60 tablet 2   testosterone cypionate (DEPOTESTOSTERONE CYPIONATE) 200 MG/ML injection INJECT 1 ML INTRAMUSCULARLY EVERY 7 DAYS 4 mL 3   triamcinolone cream (KENALOG) 0.1 % Apply 1 Application topically 2 (two) times daily as needed. (Patient taking differently: Apply 1 Application topically 2 (two) times daily as needed (itching).) 45 g 5   No current facility-administered medications for this visit.     Review of Systems    He denies chest pain, palpitations, dyspnea, pnd, orthopnea, n, v, dizziness, syncope, edema, weight gain, or early satiety. All other systems reviewed and are otherwise negative except as noted above.  Physical Exam    VS:  BP 110/80   Pulse 86   Ht 5\' 8"  (1.727 m)   Wt 281 lb (127.5 kg)   SpO2 96%   BMI 42.73 kg/m   GEN: Well nourished, well developed, in no acute distress. HEENT: normal. Neck: Supple, no JVD, carotid bruits, or masses. Cardiac: RRR, no murmurs, rubs, or gallops. No clubbing, cyanosis, edema.  Radials/DP/PT 2+ and equal bilaterally.  Respiratory:  Respirations regular and unlabored, clear to auscultation bilaterally. GI: Soft, nontender, nondistended, BS + x 4. MS: no deformity or atrophy. Skin: warm and  dry, no rash. Neuro:  Strength and sensation are intact. Psych: Normal affect.  Accessory Clinical Findings    ECG personally reviewed by me today -    - no EKG in office today.   Lab Results  Component Value Date   WBC 7.3 12/07/2023   HGB 14.3 12/07/2023   HCT 41.6 12/07/2023   MCV 93 12/07/2023   PLT 295 12/07/2023   Lab Results  Component Value Date   CREATININE 1.45 (H) 12/07/2023   BUN 17 12/07/2023   NA 137 12/07/2023   K 4.6 12/07/2023   CL 101 12/07/2023   CO2 23 12/07/2023   Lab Results  Component Value Date   ALT 28 12/07/2023   AST 28 12/07/2023   ALKPHOS 107 12/07/2023   BILITOT 0.7 12/07/2023   Lab Results  Component Value Date   CHOL 144 12/07/2023   HDL 45 12/07/2023   LDLCALC 79 12/07/2023   LDLDIRECT 138.1 09/03/2007   TRIG 110 12/07/2023   CHOLHDL 3.2 12/07/2023    Lab Results  Component Value Date   HGBA1C 5.9 (H) 05/17/2023    Assessment & Plan    1. Nonobstructive CAD: Cath in 02/12/2023 revealed mild nonobstructive CAD, elevated LVEDP. Stable with no anginal symptoms. Will update CBC, CMET. Continue Aspirin, Plavix, carvedilol, losartan, Lipitor, and Zetia as below.   2. Cryptogenic stroke:  Hospitalized in 04/2023 in the setting of acute CVA. Echocardiogram at the time showed EF 55 to 60%, normal LV function, no RWMA, mild LVH, G1 DD, normal RV systolic function, no significant valvular abnormalities, mild dilation of the ascending aorta measuring 42 mm, no significant change from prior study.  Outpatient cardiac monitor revealed minimum heart rate 48 bpm, maximal heart rate 112 bpm, average heart rate 69 bpm, predominantly sinus rhythm, PACs and PVCs, 1 pause lasting 3.9 seconds, no significant abnormalities. No recurrence or residual deficit. Continue Aspirin, Plavix, Lipitor, Zetia as below.    3. Chronic diastolic heart failure: Most recent echo as above. Euvolemic and well compensated on exam.  No indication for loop diuretic at this  time.  Continue carvedilol as below, losartan.   4. Sinus bradycardia/sinus pause: Noted on most recent cardiac monitor as above.  Will decrease carvedilol to 3.125 mg twice daily.  He denies any recent dizziness, presyncope or syncope.     5. Hypertension: Losartan and carvedilol were recently decreased in the setting of hypotension. BP currently  well controlled. Continue to monitor BP and report BP consistently greater than 140/80, SBP consistently less than 100.  Continue all, losartan at current dose.   5. Hyperlipidemia: LDL was 79 in 11/2023.  LDL remains elevated slightly above goal, will add Zetia 10 mg daily.  Will repeat fasting lipids, CMET in to 8 weeks. If LDL remains elevated above goal, consider referral to lipid clinic Pharm.D. for consideration of PCSK9 inhibitor.  Continue Lipitor.   6.  CKD stage IIIa: Creatinine was stable at 1.45 in 11/2023.    7. Hypothyroidism: TSH was 3.35 in 11/2023.  Managed per PCP.  8. Anxiety/Depression/ETOH use: Reports feelings of both anxiety and depression.  Currently only taking Xanax to manage her symptoms.  We discussed possibility of counseling.  Has seen a counselor in the past and found this helpful.  He has been drinking a significant amount of alcohol recently.  Advised him to limit alcohol consumption to no more than 2 drinks per day.  Recommend follow-up with PCP, continue to monitor.   9. Disposition: Follow-up in 4 to 6 months, sooner if needed.      Joylene Grapes, NP 12/18/2023, 4:03 PM

## 2023-12-20 ENCOUNTER — Encounter: Payer: Self-pay | Admitting: Nurse Practitioner

## 2023-12-21 ENCOUNTER — Ambulatory Visit: Payer: Self-pay

## 2023-12-21 DIAGNOSIS — K529 Noninfective gastroenteritis and colitis, unspecified: Secondary | ICD-10-CM | POA: Diagnosis not present

## 2023-12-21 DIAGNOSIS — R197 Diarrhea, unspecified: Secondary | ICD-10-CM | POA: Diagnosis not present

## 2023-12-21 DIAGNOSIS — R112 Nausea with vomiting, unspecified: Secondary | ICD-10-CM | POA: Diagnosis not present

## 2023-12-21 NOTE — Telephone Encounter (Signed)
 Attempted to contact patient, "has voicemail box that has not been set up".

## 2023-12-21 NOTE — Telephone Encounter (Signed)
N/V/D

## 2023-12-21 NOTE — Telephone Encounter (Signed)
  Chief Complaint: Nausea, vomiting and diarrhea Symptoms: above Frequency:  Pertinent Negatives: Patient denies  Disposition: [] ED /[] Urgent Care (no appt availability in office) / [] Appointment(In office/virtual)/ []  [place] [facility]/ [] Home Care/ [] Refused Recommended Disposition /[] [place] [facility]/ [x]  Follow-up with PCP Additional Notes: Pt has nausea, vomiting and diarrhea. Pt is checked in at an UC now and has paid the co-pay. Pt will stay there for care.   Reason for Disposition  [1] Follow-up call to recent contact AND [2] information only call, no triage required  Answer Assessment - Initial Assessment Questions 1. REASON FOR CALL or QUESTION: "What is your reason for calling today?" or "How can I best help you?" or "What question do you have that I can help answer?"     Pt has nausea, vomiting and diarrhea. Pt is checked in at an UC now and has paid the co-pay. Pt will stay there for care.  Protocols used: Information Only Call - No Triage-A-AH

## 2023-12-21 NOTE — Telephone Encounter (Signed)
 Unable to reach patient after 3 attempts by E2C2 NT, routing to the provider for resolution per protocol.

## 2023-12-22 NOTE — Telephone Encounter (Signed)
 Spoke with pt state that he went to [facility] for this problem

## 2024-01-28 DIAGNOSIS — I2510 Atherosclerotic heart disease of native coronary artery without angina pectoris: Secondary | ICD-10-CM | POA: Diagnosis not present

## 2024-01-28 DIAGNOSIS — I639 Cerebral infarction, unspecified: Secondary | ICD-10-CM | POA: Diagnosis not present

## 2024-01-28 DIAGNOSIS — E785 Hyperlipidemia, unspecified: Secondary | ICD-10-CM | POA: Diagnosis not present

## 2024-01-28 DIAGNOSIS — I5032 Chronic diastolic (congestive) heart failure: Secondary | ICD-10-CM | POA: Diagnosis not present

## 2024-01-28 DIAGNOSIS — I10 Essential (primary) hypertension: Secondary | ICD-10-CM | POA: Diagnosis not present

## 2024-01-29 LAB — COMPREHENSIVE METABOLIC PANEL WITH GFR
ALT: 29 IU/L (ref 0–44)
AST: 29 IU/L (ref 0–40)
Albumin: 4.3 g/dL (ref 3.9–4.9)
Alkaline Phosphatase: 113 IU/L (ref 44–121)
BUN/Creatinine Ratio: 14 (ref 10–24)
BUN: 20 mg/dL (ref 8–27)
Bilirubin Total: 0.6 mg/dL (ref 0.0–1.2)
CO2: 20 mmol/L (ref 20–29)
Calcium: 9.3 mg/dL (ref 8.6–10.2)
Chloride: 107 mmol/L — ABNORMAL HIGH (ref 96–106)
Creatinine, Ser: 1.45 mg/dL — ABNORMAL HIGH (ref 0.76–1.27)
Globulin, Total: 2.6 g/dL (ref 1.5–4.5)
Glucose: 136 mg/dL — ABNORMAL HIGH (ref 70–99)
Potassium: 4.1 mmol/L (ref 3.5–5.2)
Sodium: 144 mmol/L (ref 134–144)
Total Protein: 6.9 g/dL (ref 6.0–8.5)
eGFR: 53 mL/min/{1.73_m2} — ABNORMAL LOW (ref >59)

## 2024-01-29 LAB — LIPID PANEL
Chol/HDL Ratio: 2.8 ratio (ref 0.0–5.0)
Cholesterol, Total: 112 mg/dL (ref 100–199)
HDL: 40 mg/dL (ref >39)
LDL Chol Calc (NIH): 49 mg/dL (ref 0–99)
Triglycerides: 127 mg/dL (ref 0–149)
VLDL Cholesterol Cal: 23 mg/dL (ref 5–40)

## 2024-02-02 ENCOUNTER — Ambulatory Visit: Payer: Self-pay

## 2024-02-16 ENCOUNTER — Ambulatory Visit (INDEPENDENT_AMBULATORY_CARE_PROVIDER_SITE_OTHER): Admitting: Psychology

## 2024-02-16 DIAGNOSIS — F418 Other specified anxiety disorders: Secondary | ICD-10-CM | POA: Diagnosis not present
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
## 2024-03-01 ENCOUNTER — Encounter: Payer: Self-pay | Admitting: Psychology

## 2024-03-01 ENCOUNTER — Ambulatory Visit: Admitting: Psychology

## 2024-03-01 DIAGNOSIS — F418 Other specified anxiety disorders: Secondary | ICD-10-CM | POA: Diagnosis not present

## 2024-03-01 NOTE — Progress Notes (Signed)
 [facility] Counselor/Therapist Progress Note  Patient ID: Richard Sellers, MRN: [id number]    Date: 03/01/24  Time Spent: 3:03  pm - 4:01  pm : 58 Minutes  Treatment Type: Individual Therapy.  Reported Symptoms: Depression & anxiety   Mental Status Exam: Appearance:  Casual     Behavior: Appropriate  Motor: Normal  Speech/Language:  Normal Rate  Affect: Congruent  Mood: dysthymic  Thought process: normal  Thought content:   WNL  Sensory/Perceptual disturbances:   WNL  Orientation: oriented to person, place, time/date, and situation  Attention: Good  Concentration: Good  Memory: WNL  Fund of knowledge:  Good  Insight:   Good  Judgment:  Good  Impulse Control: Good   Risk Assessment: Danger to Self:  No Self-injurious Behavior: No Danger to Others: No Duty to Warn:no Physical Aggression / Violence:No  Access to Firearms a concern: No  Gang Involvement:No   Subjective:   Richard Sellers participated in the session, in person in the office with the therapist, and consented to treatment Richard Sellers reviewed the events of the past week. We reviewed numerous treatment approaches including CBT, BA, Problem Solving, and Solution focused therapy. Psych-education regarding the Richard Sellers diagnosis of Depression with anxiety was provided during the session. We discussed Richard Sellers's goals treatment goals which include like to "manage my anxiety, depression, and not wanting to leave the home". Increase socialization. Additional goals include increasing mindfulness, engage in consistent self-care, proactive symptom management, develop a daily routine, and challenge negative self-talk and distortions. Richard Sellers provided verbal approval of the treatment plan.   Interventions: Psycho-education & Goal Setting.   Diagnosis:   Depression with anxiety  Psychiatric Treatment: Yes , via PCP. See chart.   Treatment Plan:  Client Abilities/Strengths Richard Sellers  forthcoming and motivated for change.   Support System: Friends.   Client Treatment Preferences Outpatient therapy.   Client Statement of Needs Richard Sellers would like to "manage my anxiety, depression, and not wanting to leave the home". Increase socialization. Additional goals include increasing mindfulness, engage in consistent self-care, proactive symptom management, develop a daily routine, managing rumination, and challenge negative self-talk and distortions.   Treatment Level Weekly  Symptoms  Anxiety: Feeling anxious, difficulty managing worry, worrying about different things, trouble relaxing, restlessness, irritability, feeling afraid something awful might happen.     (Status: maintained) Depression:  Loss of interest, feel down, poor sleep, lethargy, feeling bad about self, trouble concentration.  (Status: maintained)  Goals:   Richard Sellers experiences symptoms of depression and anxiety.  Treatment plan signed and available on s-drive:  No, pending signature via MyChart.  Richard Sellers was sent the treatment plan signature form on 03/01/24.   Target Date: 03/01/25 Frequency: Weekly  Progress: 0 Modality: individual    Therapist will provide referrals for additional resources as appropriate.  Therapist will provide psycho-education regarding [name]'s diagnosis and corresponding treatment approaches and interventions. Richard Boyden, LCSW will support the patient's ability to achieve the goals identified. will employ CBT, BA, Problem-solving, Solution Focused, Mindfulness,  coping skills, & other evidenced-based practices will be used to promote progress towards healthy functioning to help manage decrease symptoms associated with his diagnosis.   Reduce overall level, frequency, and intensity of the feelings of depression & anxiety evidenced by decreased overall from 6 to 7 days/week to 0 to 1 days/week per client report for at least 3 consecutive months. Verbally express  understanding of the relationship between feelings of depression, anxiety and their impact  on thinking patterns and behaviors. Verbalize an understanding of the role that distorted thinking plays in creating fears, excessive worry, and ruminations.  [name] participated in the creation of the treatment plan)    Richard Boyden, LCSW

## 2024-03-22 ENCOUNTER — Ambulatory Visit: Admitting: Psychology

## 2024-04-05 ENCOUNTER — Ambulatory Visit (INDEPENDENT_AMBULATORY_CARE_PROVIDER_SITE_OTHER): Admitting: Psychology

## 2024-04-05 DIAGNOSIS — F418 Other specified anxiety disorders: Secondary | ICD-10-CM | POA: Diagnosis not present

## 2024-04-05 NOTE — Progress Notes (Signed)
 [facility] Counselor/Therapist Progress Note  Patient ID: Richard Sellers, MRN: [id number]    Date: 04/05/24  Time Spent: 2:59  pm - 4:00 pm : 61 Minutes  Treatment Type: Individual Therapy.  Reported Symptoms: Depression & anxiety   Mental Status Exam: Appearance:  Casual     Behavior: Appropriate  Motor: Normal  Speech/Language:  Normal Rate  Affect: Congruent  Mood: dysthymic  Thought process: normal  Thought content:   WNL  Sensory/Perceptual disturbances:   WNL  Orientation: oriented to person, place, time/date, and situation  Attention: Fair  Concentration: Good  Memory: WNL  Fund of knowledge:  Good  Insight:   Good  Judgment:  Good  Impulse Control: Good   Risk Assessment: Danger to Self:  No Self-injurious Behavior: No Danger to Others: No Duty to Warn:no Physical Aggression / Violence:No  Access to Firearms a concern: No  Gang Involvement:No   Subjective:   Richard Sellers participated in the session, in person in the office with the therapist, and consented to treatment Richard Sellers reviewed the events of the past week. Richard Sellers noted canceling the most recent appointment due to an illness. He provided an update since our last appointment. He noted today being the two year anniversary of his mother's health. He noted his current stressors including work stressors, his friend's recent termination from work, stressful atmosphere in the home due to roommate. He noted his roommate being very dramatic and noted taking things very personally. He noted that his roommate often gives him the cold shoulder. We worked on processing his stressors during the session. He noted feelings of loneliness and noted being single for the past 7 years. He noted a need for more socializing and companionship. He noted the difficulty of engaging in tasks alone. He noted barriers to engagement including energy, motivation, availability of activities, interest from others to  socialize, the duration of the engagement. He noted the common reasons he uses to cancel outings including motivation to leave the home, get prepared, do his hair. Therapist highlighted negative self-talk and over-thinking as barriers, as well. Richard Sellers would benefit from speaking with his PCP or meeting with a psychiatric provider to discuss psychotropic medication. Richard Sellers noted having a history of taking Zoloft  and noted this going poorly. He is open to taking an anti-depressant but noted a significant barrier to this is motivation. We worked on problem solving this during the session and therapist encouraged Richard Sellers to contact his medical provider and schedule a virtual appointment to discuss his mood and possible treatment options. He would also benefit from completing a measure for ADHD due to possible hyperactivity (verbal) and the ASRS V1.1 will be completed during our follow-up. Therapist introduced behavioral activation during the session and encouraged Richard Sellers to identify activities he would like to engage in and previous barriers to engagement. Richard Sellers expressed commitment toward this session goals. Therapist validated Richard Sellers's feelings and experience during the session and provided supportive therapy. A follow-up was scheduled for continued treatment, which he benefits from.   Interventions: CBT and BA.   Diagnosis:   Depression with anxiety  Psychiatric Treatment: Yes , via PCP. See chart.   Treatment Plan:  Client Abilities/Strengths Richard Sellers forthcoming and motivated for change.   Support System: Friends.   Client Treatment Preferences Outpatient therapy.   Client Statement of Needs Richard Sellers would like to manage my anxiety, depression, and not wanting to leave the home. Increase socialization. Additional goals include increasing mindfulness, engage in consistent self-care, proactive symptom management,  develop a daily routine, managing rumination, and challenge negative self-talk and  distortions.   Treatment Level Weekly  Symptoms  Anxiety: Feeling anxious, difficulty managing worry, worrying about different things, trouble relaxing, restlessness, irritability, feeling afraid something awful might happen.     (Status: maintained) Depression:  Loss of interest, feel down, poor sleep, lethargy, feeling bad about self, trouble concentration.  (Status: maintained)  Goals:   Richard Sellers experiences symptoms of depression and anxiety.  Treatment plan signed and available on s-drive:  No, pending signature via MyChart.  Richard Sellers was sent the treatment plan signature form on 03/01/24.   Target Date: 03/01/25 Frequency: Weekly  Progress: 0 Modality: individual    Therapist will provide referrals for additional resources as appropriate.  Therapist will provide psycho-education regarding Epic's diagnosis and corresponding treatment approaches and interventions. [name], LCSW will support the patient's ability to achieve the goals identified. will employ CBT, BA, Problem-solving, Solution Focused, Mindfulness,  coping skills, & other evidenced-based practices will be used to promote progress towards healthy functioning to help manage decrease symptoms associated with his diagnosis.   Reduce overall level, frequency, and intensity of the feelings of depression & anxiety evidenced by decreased overall from 6 to 7 days/week to 0 to 1 days/week per client report for at least 3 consecutive months. Verbally express understanding of the relationship between feelings of depression, anxiety and their impact on thinking patterns and behaviors. Verbalize an understanding of the role that distorted thinking plays in creating fears, excessive worry, and ruminations.  Richard Sellers participated in the creation of the treatment plan)  [name], LCSW

## 2024-04-08 ENCOUNTER — Telehealth (INDEPENDENT_AMBULATORY_CARE_PROVIDER_SITE_OTHER): Admitting: Family Medicine

## 2024-04-08 ENCOUNTER — Encounter: Payer: Self-pay | Admitting: Family Medicine

## 2024-04-08 DIAGNOSIS — F418 Other specified anxiety disorders: Secondary | ICD-10-CM

## 2024-04-08 MED ORDER — CITALOPRAM HYDROBROMIDE 20 MG PO TABS: 20.0000 mg | ORAL_TABLET | Freq: Every day | ORAL | 5 refills | Status: DC

## 2024-04-08 NOTE — Progress Notes (Signed)
 Subjective:    Patient ID: Richard Sellers, male    DOB: 1956-03-29, 68 y.o.   MRN: [id number]  HPI Virtual Visit via Video Note  I connected with the patient on 04/08/24 at  1:00 PM EDT by a video enabled telemedicine application and verified that I am speaking with the correct person using two identifiers.  Location patient: home Location provider:work or home office Persons participating in the virtual visit: patient, provider  I discussed the limitations of evaluation and management by telemedicine and the availability of in person appointments. The patient expressed understanding and agreed to proceed.   HPI: Here asking to start back on Citalopram  for anxiety and depression. He took this for a few years in the past, and it helped him a lot. Lately he has been more depressed ad anxious than usual. He meets with his psychologist regularly. No suicidal thoughts.    ROS: See pertinent positives and negatives per HPI.  Past Medical History:  Diagnosis Date   Allergy    seasonal allergies   Anxiety    on meds   Chronic kidney disease    hx of kidney stones   Depression    on meds   ED (erectile dysfunction)    GERD (gastroesophageal reflux disease)    not on meds-diet controlled-uses OTC meds for tx   Heart murmur    at age 39 years old, heard x1 , never again   History of kidney stones    Hyperlipidemia    on meds   Hypertension    on meds   Hypothyroidism    hx of-was on meds-   PONV (postoperative nausea and vomiting)    Warts, genital     Past Surgical History:  Procedure Laterality Date   APPENDECTOMY  1975   COLONOSCOPY  11/08/2007   per Dr. Jakie, diverticulosis but no polyps, repeat in 10 yrs    CYSTECTOMY     EXTRACORPOREAL SHOCK WAVE LITHOTRIPSY Left 10/29/2017   Procedure: LEFT EXTRACORPOREAL SHOCK WAVE LITHOTRIPSY (ESWL);  Surgeon: Matilda Senior, MD;  Location: [facility];  Service: Urology;  Laterality: Left;   HERNIA REPAIR  07/2017    umbilical hernia   KNEE ARTHROSCOPY Right 1993   LEFT HEART CATH AND CORONARY ANGIOGRAPHY N/A 02/12/2023   Procedure: LEFT HEART CATH AND CORONARY ANGIOGRAPHY;  Surgeon: Swaziland, Peter M, MD;  Location: [facility] INVASIVE CV LAB;  Service: Cardiovascular;  Laterality: N/A;   TONSILLECTOMY     WISDOM TOOTH EXTRACTION  1990    Family History  Problem Relation Age of Onset   Hypertension Other        family hx   Hyperlipidemia Other        family hx   Colon polyps Neg Hx    Colon cancer Neg Hx    Esophageal cancer Neg Hx    Stomach cancer Neg Hx    Rectal cancer Neg Hx      Current Outpatient Medications:    acetaminophen  (TYLENOL ) 500 MG tablet, Take 500 mg by mouth every 6 (six) hours as needed for moderate pain., Disp: , Rfl:    ALPRAZolam  (XANAX ) 1 MG tablet, TAKE ONE TABLET BY MOUTH EVERY 6 HOURS AS NEEDED FOR ANXIETY, Disp: 120 tablet, Rfl: 5   aspirin  81 MG chewable tablet, Chew 1 tablet (81 mg total) by mouth daily., Disp: , Rfl:    atorvastatin  (LIPITOR ) 80 MG tablet, Take 1 tablet (80 mg total) by mouth daily., Disp: 90 tablet, Rfl: 3  carvedilol  (COREG ) 3.125 MG tablet, Take 1 tablet (3.125 mg total) by mouth 2 (two) times daily., Disp: 180 tablet, Rfl: 3   clopidogrel  (PLAVIX ) 75 MG tablet, Take 1 tablet (75 mg total) by mouth daily., Disp: 90 tablet, Rfl: 3   ezetimibe  (ZETIA ) 10 MG tablet, Take 1 tablet (10 mg total) by mouth daily., Disp: 90 tablet, Rfl: 3   losartan  (COZAAR ) 50 MG tablet, Take 1 tablet (50 mg total) by mouth daily., Disp: 90 tablet, Rfl: 3   ondansetron  (ZOFRAN ) 8 MG tablet, Take 1 tablet (8 mg total) by mouth every 8 (eight) hours as needed for nausea or vomiting., Disp: 60 tablet, Rfl: 2   testosterone  cypionate (DEPOTESTOSTERONE CYPIONATE) 200 MG/ML injection, INJECT 1 ML INTRAMUSCULARLY EVERY 7 DAYS, Disp: 4 mL, Rfl: 3   triamcinolone  cream (KENALOG ) 0.1 %, Apply 1 Application topically 2 (two) times daily as needed. (Patient taking differently: Apply 1  Application topically 2 (two) times daily as needed (itching).), Disp: 45 g, Rfl: 5  EXAM:  VITALS per patient if applicable:  GENERAL: alert, oriented, appears well and in no acute distress  HEENT: atraumatic, conjunttiva clear, no obvious abnormalities on inspection of external nose and ears  NECK: normal movements of the head and neck  LUNGS: on inspection no signs of respiratory distress, breathing rate appears normal, no obvious gross SOB, gasping or wheezing  CV: no obvious cyanosis  MS: moves all visible extremities without noticeable abnormality  PSYCH/NEURO: pleasant and cooperative, no obvious depression or anxiety, speech and thought processing grossly intact  ASSESSMENT AND PLAN: We will start him on Citalopram  20 mg daily. Report back in 4 weeks.  Garnette Olmsted, MD  Discussed the following assessment and plan:  No diagnosis found.     I discussed the assessment and treatment plan with the patient. The patient was provided an opportunity to ask questions and all were answered. The patient agreed with the plan and demonstrated an understanding of the instructions.   The patient was advised to call back or seek an in-person evaluation if the symptoms worsen or if the condition fails to improve as anticipated.      Review of Systems     Objective:   Physical Exam        Assessment & Plan:

## 2024-04-14 ENCOUNTER — Encounter: Payer: Self-pay | Admitting: Family Medicine

## 2024-04-18 ENCOUNTER — Encounter: Payer: Self-pay | Admitting: Family Medicine

## 2024-04-19 ENCOUNTER — Ambulatory Visit: Attending: Internal Medicine | Admitting: Nurse Practitioner

## 2024-04-19 VITALS — BP 94/66 | HR 68 | Ht 68.0 in | Wt 273.0 lb

## 2024-04-19 DIAGNOSIS — I639 Cerebral infarction, unspecified: Secondary | ICD-10-CM | POA: Diagnosis not present

## 2024-04-19 DIAGNOSIS — I10 Essential (primary) hypertension: Secondary | ICD-10-CM

## 2024-04-19 DIAGNOSIS — N1831 Chronic kidney disease, stage 3a: Secondary | ICD-10-CM

## 2024-04-19 DIAGNOSIS — F1090 Alcohol use, unspecified, uncomplicated: Secondary | ICD-10-CM

## 2024-04-19 DIAGNOSIS — F418 Other specified anxiety disorders: Secondary | ICD-10-CM

## 2024-04-19 DIAGNOSIS — R001 Bradycardia, unspecified: Secondary | ICD-10-CM | POA: Diagnosis not present

## 2024-04-19 DIAGNOSIS — E039 Hypothyroidism, unspecified: Secondary | ICD-10-CM

## 2024-04-19 DIAGNOSIS — I5032 Chronic diastolic (congestive) heart failure: Secondary | ICD-10-CM

## 2024-04-19 DIAGNOSIS — I2510 Atherosclerotic heart disease of native coronary artery without angina pectoris: Secondary | ICD-10-CM

## 2024-04-19 DIAGNOSIS — E785 Hyperlipidemia, unspecified: Secondary | ICD-10-CM

## 2024-04-19 NOTE — Patient Instructions (Signed)
 Medication Instructions:  STOP Coreg  *If you need a refill on your cardiac medications before your next appointment, please call your pharmacy*  Lab Work: None ordered If you have labs (blood work) drawn today and your tests are completely normal, you will receive your results only by: MyChart Message (if you have MyChart) OR A paper copy in the mail If you have any lab test that is abnormal or we need to change your treatment, we will call you to review the results.  Testing/Procedures: None ordered  Follow-Up: At [facility], you and your health needs are our priority.  As part of our continuing mission to provide you with exceptional heart care, our providers are all part of one team.  This team includes your primary Cardiologist (physician) and Advanced Practice Providers or APPs (Physician Assistants and Nurse Practitioners) who all work together to provide you with the care you need, when you need it.  Your next appointment:   6-8 week(s)  Provider:   Damien Braver, NP        We recommend signing up for the patient portal called MyChart.  Sign up information is provided on this After Visit Summary.  MyChart is used to connect with patients for Virtual Visits (Telemedicine).  Patients are able to view lab/test results, encounter notes, upcoming appointments, etc.  Non-urgent messages can be sent to your provider as well.   To learn more about what you can do with MyChart, go to [email or web address].   Other Instructions

## 2024-04-19 NOTE — Telephone Encounter (Signed)
 Yes the Citalopram  could be causing his stomach upset, though this is surprising since he did well on this in the past. Yes he can stop taking this immediately with no taper needed

## 2024-04-19 NOTE — Progress Notes (Unsigned)
 Office Visit    Patient Name: Richard Sellers Date of Encounter: 04/19/2024  Primary Care Provider:  Johnny Garnette LABOR, MD Primary Cardiologist:  Dub Huntsman, DO  Chief Complaint    68 year old male with a history of NSTEMI, nonobstructive CAD, possible myopericarditis, chronic diastolic heart failure, hypertension, hyperlipidemia, CVA, CKD stage IIIa, hypothyroidism, ED, psoriasis, anxiety, depression, and GERD who presents for follow-up related to CAD and hypertension.   Past Medical History    Past Medical History:  Diagnosis Date   Allergy    seasonal allergies   Anxiety    on meds   Chronic kidney disease    hx of kidney stones   Depression    on meds   ED (erectile dysfunction)    GERD (gastroesophageal reflux disease)    not on meds-diet controlled-uses OTC meds for tx   Heart murmur    at age 66 years old, heard x1 , never again   History of kidney stones    Hyperlipidemia    on meds   Hypertension    on meds   Hypothyroidism    hx of-was on meds-   PONV (postoperative nausea and vomiting)    Warts, genital    Past Surgical History:  Procedure Laterality Date   APPENDECTOMY  1975   COLONOSCOPY  11/08/2007   per Dr. Jakie, diverticulosis but no polyps, repeat in 10 yrs    CYSTECTOMY     EXTRACORPOREAL SHOCK WAVE LITHOTRIPSY Left 10/29/2017   Procedure: LEFT EXTRACORPOREAL SHOCK WAVE LITHOTRIPSY (ESWL);  Surgeon: Matilda Garnette, MD;  Location: [facility];  Service: Urology;  Laterality: Left;   HERNIA REPAIR  07/2017   umbilical hernia   KNEE ARTHROSCOPY Right 1993   LEFT HEART CATH AND CORONARY ANGIOGRAPHY N/A 02/12/2023   Procedure: LEFT HEART CATH AND CORONARY ANGIOGRAPHY;  Surgeon: Swaziland, Peter M, MD;  Location: [facility] INVASIVE CV LAB;  Service: Cardiovascular;  Laterality: N/A;   TONSILLECTOMY     WISDOM TOOTH EXTRACTION  1990    Allergies  No Known Allergies   Labs/Other Studies Reviewed    The following studies were reviewed  today: *** Cardiac Studies & Procedures   ______________________________________________________________________________________________ CARDIAC CATHETERIZATION  CARDIAC CATHETERIZATION 02/12/2023  Conclusion   Prox RCA to Mid RCA lesion is 20% stenosed.   LV end diastolic pressure is moderately elevated.  Mild nonobstructive CAD Moderately elevated LVEDP 28 mm Hg  Plan: patient has MINOCA possibly related to severe uncontrolled HTN. LVEDP is elevated. Will manage medically.  Findings Coronary Findings Diagnostic  Dominance: Right  Left Main Vessel was injected. Vessel is normal in caliber. Vessel is angiographically normal.  Left Anterior Descending Vessel was injected. Vessel is normal in caliber. The vessel exhibits minimal luminal irregularities.  Ramus Intermedius Vessel was injected. Vessel is small. Vessel is angiographically normal.  Left Circumflex Vessel was injected. Vessel is normal in caliber. The vessel exhibits minimal luminal irregularities.  Right Coronary Artery Prox RCA to Mid RCA lesion is 20% stenosed.  Intervention  No interventions have been documented.     ECHOCARDIOGRAM  ECHOCARDIOGRAM COMPLETE 05/17/2023  Narrative ECHOCARDIOGRAM REPORT    Patient Name:   Richard Sellers Date of Exam: 05/17/2023 Medical Rec #:  [id number]        Height:       68.0 in Accession #:    [id number]       Weight:       281.1 lb Date of Birth:  October 02, 1955  BSA:          2.362 m Patient Age:    66 years         BP:           127/86 mmHg Patient Gender: M                HR:           70 bpm. Exam Location:  Inpatient  Procedure: 2D Echo, Cardiac Doppler and Color Doppler  Indications:    stroke  History:        Patient has prior history of Echocardiogram examinations, most recent 02/11/2023. Signs/Symptoms:Edema; Risk Factors:Current Smoker and Hypertension.  Sonographer:    Tinnie Barefoot RDCS Referring Phys: [id number] ALEXIS  HUGELMEYER   Sonographer Comments: Patient is obese. Image acquisition challenging due to patient body habitus. IMPRESSIONS   1. Left ventricular ejection fraction, by estimation, is 55 to 60%. Left ventricular ejection fraction by PLAX is 57 %. The left ventricle has normal function. The left ventricle has no regional wall motion abnormalities. There is mild left ventricular hypertrophy. Left ventricular diastolic parameters are consistent with Grade I diastolic dysfunction (impaired relaxation). 2. Right ventricular systolic function is normal. The right ventricular size is normal. Tricuspid regurgitation signal is inadequate for assessing PA pressure. 3. Left atrial size was mildly dilated. 4. Right atrial size was mildly dilated. 5. The mitral valve is grossly normal. No evidence of mitral valve regurgitation. 6. The aortic valve is tricuspid. Aortic valve regurgitation is not visualized. 7. Aortic dilatation noted. There is mild dilatation of the ascending aorta, measuring 42 mm. 8. The inferior vena cava is normal in size with greater than 50% respiratory variability, suggesting right atrial pressure of 3 mmHg.  Comparison(s): No significant change from prior study. 02/11/2023: LVEF 55-60%, mild biatrial enlargement.  FINDINGS Left Ventricle: Left ventricular ejection fraction, by estimation, is 55 to 60%. Left ventricular ejection fraction by PLAX is 57 %. The left ventricle has normal function. The left ventricle has no regional wall motion abnormalities. The left ventricular internal cavity size was normal in size. There is mild left ventricular hypertrophy. Left ventricular diastolic parameters are consistent with Grade I diastolic dysfunction (impaired relaxation). Indeterminate filling pressures.  Right Ventricle: The right ventricular size is normal. No increase in right ventricular wall thickness. Right ventricular systolic function is normal. Tricuspid regurgitation signal is  inadequate for assessing PA pressure.  Left Atrium: Left atrial size was mildly dilated.  Right Atrium: Right atrial size was mildly dilated.  Pericardium: There is no evidence of pericardial effusion.  Mitral Valve: The mitral valve is grossly normal. No evidence of mitral valve regurgitation.  Tricuspid Valve: The tricuspid valve is normal in structure. Tricuspid valve regurgitation is not demonstrated.  Aortic Valve: The aortic valve is tricuspid. Aortic valve regurgitation is not visualized.  Pulmonic Valve: The pulmonic valve was normal in structure. Pulmonic valve regurgitation is not visualized.  Aorta: Aortic dilatation noted. There is mild dilatation of the ascending aorta, measuring 42 mm.  Venous: The inferior vena cava is normal in size with greater than 50% respiratory variability, suggesting right atrial pressure of 3 mmHg.  IAS/Shunts: No atrial level shunt detected by color flow Doppler.   LEFT VENTRICLE PLAX 2D LV EF:         Left            Diastology ventricular     LV e' medial:    8.38 cm/s ejection  LV E/e' medial:  9.8 fraction by     LV e' lateral:   7.18 cm/s PLAX is 57      LV E/e' lateral: 11.4 %. LVIDd:         5.00 cm LVIDs:         3.50 cm LV PW:         1.10 cm LV IVS:        1.10 cm LVOT diam:     1.90 cm LV SV:         69 LV SV Index:   29 LVOT Area:     2.84 cm   RIGHT VENTRICLE             IVC RV Basal diam:  3.00 cm     IVC diam: 1.60 cm RV S prime:     20.30 cm/s TAPSE (M-mode): 2.0 cm  LEFT ATRIUM             Index        RIGHT ATRIUM           Index LA diam:        3.40 cm 1.44 cm/m   RA Area:     13.80 cm LA Vol (A2C):   48.5 ml 20.54 ml/m  RA Volume:   31.50 ml  13.34 ml/m LA Vol (A4C):   42.8 ml 18.12 ml/m LA Biplane Vol: 45.9 ml 19.43 ml/m AORTIC VALVE LVOT Vmax:   127.00 cm/s LVOT Vmean:  84.400 cm/s LVOT VTI:    0.242 m  AORTA Ao Root diam: 3.30 cm Ao Asc diam:  4.20 cm  MITRAL VALVE MV Area (PHT):  2.83 cm    SHUNTS MV Decel Time: 268 msec    Systemic VTI:  0.24 m MV E velocity: 81.90 cm/s  Systemic Diam: 1.90 cm MV A velocity: 99.40 cm/s MV E/A ratio:  0.82  Vinie Maxcy MD Electronically signed by Vinie Maxcy MD Signature Date/Time: 05/17/2023/11:33:52 AM    Final    MONITORS  CARDIAC EVENT MONITOR 07/03/2023  Narrative The patient wore the monitor for 30  days  starting 06/01/2023.  . Indication: Cryptogenic stroke  The minimum heart rate was 48 bpm, maximum heart rate was 112 bpm, and average heart rate was 69  bpm. Predominant underlying rhythm was Sinus Rhythm.   Premature atrial complexes were rare. Premature Ventricular complexes rare.  One pause noted lasting 3.9 seconds.  No AV block and no atrial fibrillation present.  Patient triggered events associated with sinus rhythm.  Conclusion: This study is showed evidence of 3.9 seconds pause. This pause is in the setting of low dose beta blocker.  Asymptomatic will optimize medication and stop beta blocker.       ______________________________________________________________________________________________     Recent Labs: 12/07/2023: Hemoglobin 14.3; Platelets 295; TSH 3.350 01/28/2024: ALT 29; BUN 20; Creatinine, Ser 1.45; Potassium 4.1; Sodium 144  Recent Lipid Panel    Component Value Date/Time   CHOL 112 01/28/2024 1151   TRIG 127 01/28/2024 1151   HDL 40 01/28/2024 1151   CHOLHDL 2.8 01/28/2024 1151   CHOLHDL 6.2 05/17/2023 0452   VLDL 30 05/17/2023 0452   LDLCALC 49 01/28/2024 1151   LDLDIRECT 138.1 09/03/2007 1118    History of Present Illness    68 year old male with a history of NSTEMI, nonobstructive CAD, possible myopericarditis, chronic diastolic heart failure, hypertension, hyperlipidemia,  CVA, CKD stage IIIa, hypothyroidism, ED, psoriasis,anxiety, depression, and GERD who presents for follow-up related to  CAD and hypertension.    He was hospitalized in 01/2023 in the setting of  NSTEMI.  Cardiac catheterization on 02/12/2023 revealed mild nonobstructive CAD, elevated LVEDP.  He reported nonadherence to his medication regimen prior to admission.  Echocardiogram showed EF 55 to 60%, akinetic apical anterior segment, G1 DD.  He received IV Lasix .  CRP was elevated. Cardiac MRI was deferred.  He was started on colchicine  0.6 mg daily given concern for myopericarditis.  Otherwise, his home medications were resumed. He contacted our office in July 2024 with concern for fatigue, myalgias.  He was advised to stop colchicine  given increased risk for rhabdomyolysis with chronic combined atorvastatin .  He never discontinued this medication.  Additionally, carvedilol  was decreased to 12.5 mg twice daily.  He was hospitalized in 04/2023 in the setting of CVA.  Echocardiogram at the time showed EF 55 to 60%, normal LV function, no RWMA, mild LVH, G1 DD, normal RV systolic function, no significant valvular abnormalities, mild dilation of the ascending aorta measuring 42 mm, no significant change from prior study.  Outpatient cardiac monitor revealed minimum heart rate 48 bpm, maximal heart rate 112 bpm, average heart rate 69 bpm, predominantly sinus rhythm, PACs and PVCs, 1 pause lasting 3.9 seconds, no significant abnormalities. Carvedilol  was decreased to 3.125 mg twice daily.  He was last seen in the office on 12/18/2023 and was stable from a cardiac standpoint.  He denies symptoms concerning for angina.  He was started on Zetia .   He presents today for follow-up. Since his last visit he has More from a cardiac standpoint.  He started taking Celexa  per PCP, he unfortunately did not tolerate the medication due to GI upset. 1. Nonobstructive CAD: Cath in 02/12/2023 revealed mild nonobstructive CAD, elevated LVEDP. Stable with no anginal symptoms. Will update CBC, CMET. Continue Aspirin , Plavix , carvedilol , losartan , Lipitor , and Zetia  as below.    2. Cryptogenic stroke:  Hospitalized in 04/2023 in the  setting of acute CVA. Echocardiogram at the time showed EF 55 to 60%, normal LV function, no RWMA, mild LVH, G1 DD, normal RV systolic function, no significant valvular abnormalities, mild dilation of the ascending aorta measuring 42 mm, no significant change from prior study.  Outpatient cardiac monitor revealed minimum heart rate 48 bpm, maximal heart rate 112 bpm, average heart rate 69 bpm, predominantly sinus rhythm, PACs and PVCs, 1 pause lasting 3.9 seconds, no significant abnormalities. No recurrence or residual deficit. Continue Aspirin , Plavix , Lipitor , Zetia  as below.    3. Chronic diastolic heart failure: Most recent echo as above. Euvolemic and well compensated on exam.  No indication for loop diuretic at this time.  Continue carvedilol  as below, losartan .   4. Sinus bradycardia/sinus pause: Noted on most recent cardiac monitor as above.  Will decrease carvedilol  to 3.125 mg twice daily.  He denies any recent dizziness, presyncope or syncope.     5. Hypertension: Losartan  and carvedilol  were recently decreased in the setting of hypotension. BP currently  well controlled. Continue to monitor BP and report BP consistently greater than 140/80, SBP consistently less than 100. Continue all, losartan  at current dose.   5. Hyperlipidemia: LDL was 79 in 11/2023.  LDL remains elevated slightly above goal, will add Zetia  10 mg daily.  Will repeat fasting lipids, CMET in to 8 weeks. If LDL remains elevated above goal, consider referral to lipid clinic Pharm.D. for consideration of PCSK9 inhibitor.  Continue Lipitor .   6. CKD stage IIIa: Creatinine was stable at 1.45 in 11/2023.  7. Hypothyroidism: TSH was 3.35 in 11/2023.  Managed per PCP.   8. Anxiety/Depression/ETOH use: Reports feelings of both anxiety and depression.  Currently only taking Xanax  to manage her symptoms.  We discussed possibility of counseling.  Has seen a counselor in the past and found this helpful.  He has been drinking a  significant amount of alcohol recently.  Advised him to limit alcohol consumption to no more than 2 drinks per day.  Recommend follow-up with PCP, continue to monitor.   9. Disposition: Follow-up in  Home Medications    Current Outpatient Medications  Medication Sig Dispense Refill   acetaminophen  (TYLENOL ) 500 MG tablet Take 500 mg by mouth every 6 (six) hours as needed for moderate pain.     ALPRAZolam  (XANAX ) 1 MG tablet TAKE ONE TABLET BY MOUTH EVERY 6 HOURS AS NEEDED FOR ANXIETY 120 tablet 5   aspirin  81 MG chewable tablet Chew 1 tablet (81 mg total) by mouth daily.     atorvastatin  (LIPITOR ) 80 MG tablet Take 1 tablet (80 mg total) by mouth daily. 90 tablet 3   carvedilol  (COREG ) 3.125 MG tablet Take 1 tablet (3.125 mg total) by mouth 2 (two) times daily. 180 tablet 3   clopidogrel  (PLAVIX ) 75 MG tablet Take 1 tablet (75 mg total) by mouth daily. 90 tablet 3   ezetimibe  (ZETIA ) 10 MG tablet Take 1 tablet (10 mg total) by mouth daily. 90 tablet 3   losartan  (COZAAR ) 50 MG tablet Take 1 tablet (50 mg total) by mouth daily. 90 tablet 3   ondansetron  (ZOFRAN ) 8 MG tablet Take 1 tablet (8 mg total) by mouth every 8 (eight) hours as needed for nausea or vomiting. 60 tablet 2   triamcinolone  cream (KENALOG ) 0.1 % Apply 1 Application topically 2 (two) times daily as needed. 45 g 5   testosterone  cypionate (DEPOTESTOSTERONE CYPIONATE) 200 MG/ML injection INJECT 1 ML INTRAMUSCULARLY EVERY 7 DAYS 4 mL 3   No current facility-administered medications for this visit.     Review of Systems    ***.  All other systems reviewed and are otherwise negative except as noted above.    Physical Exam    VS:  BP 94/66   Pulse 68   Ht 5' 8 (1.727 m)   Wt 273 lb (123.8 kg)   SpO2 97%   BMI 41.51 kg/m  , BMI Body mass index is 41.51 kg/m.     GEN: Well nourished, well developed, in no acute distress. HEENT: normal. Neck: Supple, no JVD, carotid bruits, or masses. Cardiac: RRR, no murmurs, rubs,  or gallops. No clubbing, cyanosis, edema.  Radials/DP/PT 2+ and equal bilaterally.  Respiratory:  Respirations regular and unlabored, clear to auscultation bilaterally. GI: Soft, nontender, nondistended, BS + x 4. MS: no deformity or atrophy. Skin: warm and dry, no rash. Neuro:  Strength and sensation are intact. Psych: Normal affect.  Accessory Clinical Findings    ECG personally reviewed by me today - EKG Interpretation Date/Time:  Tuesday April 19 2024 15:37:20 EDT Ventricular Rate:  70 PR Interval:  208 QRS Duration:  78 QT Interval:  372 QTC Calculation: 401 R Axis:   91  Text Interpretation: Normal sinus rhythm Rightward axis Low voltage QRS When compared with ECG of 16-May-2023 16:30, PREVIOUS ECG IS PRESENT Confirmed by Daneen Perkins ([id number]) on 04/19/2024 3:59:59 PM  - no acute changes.   Lab Results  Component Value Date   WBC 7.3 12/07/2023   HGB 14.3 12/07/2023   HCT  41.6 12/07/2023   MCV 93 12/07/2023   PLT 295 12/07/2023   Lab Results  Component Value Date   CREATININE 1.45 (H) 01/28/2024   BUN 20 01/28/2024   NA 144 01/28/2024   K 4.1 01/28/2024   CL 107 (H) 01/28/2024   CO2 20 01/28/2024   Lab Results  Component Value Date   ALT 29 01/28/2024   AST 29 01/28/2024   ALKPHOS 113 01/28/2024   BILITOT 0.6 01/28/2024   Lab Results  Component Value Date   CHOL 112 01/28/2024   HDL 40 01/28/2024   LDLCALC 49 01/28/2024   LDLDIRECT 138.1 09/03/2007   TRIG 127 01/28/2024   CHOLHDL 2.8 01/28/2024    Lab Results  Component Value Date   HGBA1C 5.9 (H) 05/17/2023    Assessment & Plan    1.  ***      Damien JAYSON Braver, NP 04/19/2024, 4:00 PM

## 2024-04-20 ENCOUNTER — Encounter: Payer: Self-pay | Admitting: Nurse Practitioner

## 2024-04-21 ENCOUNTER — Ambulatory Visit: Payer: Self-pay

## 2024-04-21 NOTE — Telephone Encounter (Signed)
 FYI Only or Action Required?: FYI only for provider.  Patient was last seen in primary care on 04/08/2024 by Johnny Garnette LABOR, MD.  Called Nurse Triage reporting Diarrhea.  Symptoms began several months ago.  Symptoms are: unchanged.  Triage Disposition: See PCP When Office is Open (Within 3 Days)  Patient/caregiver understands and will follow disposition?: Yes       Copied from CRM [id number]. Topic: Clinical - Red Word Triage >> Apr 21, 2024  5:26 PM Drema MATSU wrote: Red Word that prompted transfer to Nurse Triage: Patient has been feeling very nauseous, diarrhea, having trouble sleeping/ sleeping too much, very fatigued. Patient had double vision/ dizzy (he was not sure how to classify that) Blood Pressure was 94/66 when he went to the heart doctor on Tuesday.      Reason for Disposition  [1] MILD diarrhea (e.g., 1-3 or more stools than normal in past 24 hours) AND [2] present >  7 days  (Exception: Chronic diarrhea that is not worse.)  Answer Assessment - Initial Assessment Questions 1. DIARRHEA SEVERITY: How bad is the diarrhea? How many more stools have you had in the past 24 hours than normal?      3 episodes in the last 24 hours  2. ONSET: When did the diarrhea begin?      2-3 months intermittently  3. STOOL DESCRIPTION:  How loose or watery is the diarrhea? What is the stool color? Is there any blood or mucous in the stool?     Loose and watery  4. VOMITING: Are you also vomiting? If Yes, ask: How many times in the past 24 hours?      No 5. ABDOMEN PAIN: Are you having any abdomen pain? If Yes, ask: What does it feel like? (e.g., crampy, dull, intermittent, constant)      No 6. ABDOMEN PAIN SEVERITY: If present, ask: How bad is the pain?  (e.g., Scale 1-10; mild, moderate, or severe)     N/A 7. ORAL INTAKE: If vomiting, Have you been able to drink liquids? How much liquids have you had in the past 24 hours?     N/A 8. HYDRATION: Any signs  of dehydration? (e.g., dry mouth [not just dry lips], too weak to stand, dizziness, new weight loss) When did you last urinate?     Able to drink fluids  9. EXPOSURE: Have you traveled to a foreign country recently? Have you been exposed to anyone with diarrhea? Could you have eaten any food that was spoiled?     No 10. ANTIBIOTIC USE: Are you taking antibiotics now or have you taken antibiotics in the past 2 months?       No 11. OTHER SYMPTOMS: Do you have any other symptoms? (e.g., fever, blood in stool)       Nausea, fatigue, low blood pressure (94/66) 2 days ago at cardiologist, I think my vision is a little blurry when I wake up  Protocols used: [facility]

## 2024-04-22 ENCOUNTER — Ambulatory Visit: Admitting: Family Medicine

## 2024-04-22 NOTE — Telephone Encounter (Signed)
 Spoke with pt stated that he had appointment today but could not make it due to feeling sick, pt state that if he continues to feel sick he will go to the ED/UC.

## 2024-04-26 ENCOUNTER — Ambulatory Visit: Admitting: Psychology

## 2024-04-26 DIAGNOSIS — F418 Other specified anxiety disorders: Secondary | ICD-10-CM | POA: Diagnosis not present
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
## 2024-05-06 ENCOUNTER — Telehealth (INDEPENDENT_AMBULATORY_CARE_PROVIDER_SITE_OTHER): Admitting: Family Medicine

## 2024-05-06 ENCOUNTER — Encounter: Payer: Self-pay | Admitting: Family Medicine

## 2024-05-06 DIAGNOSIS — F418 Other specified anxiety disorders: Secondary | ICD-10-CM | POA: Diagnosis not present

## 2024-05-06 DIAGNOSIS — G4700 Insomnia, unspecified: Secondary | ICD-10-CM | POA: Diagnosis not present

## 2024-05-06 DIAGNOSIS — L739 Follicular disorder, unspecified: Secondary | ICD-10-CM | POA: Diagnosis not present

## 2024-05-06 MED ORDER — TEMAZEPAM 30 MG PO CAPS: 30.0000 mg | ORAL_CAPSULE | Freq: Every evening | ORAL | 5 refills | Status: AC | PRN

## 2024-05-06 MED ORDER — DOXYCYCLINE HYCLATE 100 MG PO TABS: 100.0000 mg | ORAL_TABLET | Freq: Two times a day (BID) | ORAL | 2 refills | Status: DC

## 2024-05-06 MED ORDER — BUSPIRONE HCL 5 MG PO TABS: 5.0000 mg | ORAL_TABLET | Freq: Two times a day (BID) | ORAL | 2 refills | Status: DC

## 2024-05-06 MED ORDER — ALPRAZOLAM 1 MG PO TABS: 1.0000 mg | ORAL_TABLET | Freq: Four times a day (QID) | ORAL | 5 refills | Status: AC | PRN

## 2024-05-06 NOTE — Progress Notes (Signed)
 Subjective:    Patient ID: Richard Sellers, male    DOB: Feb 01, 1956, 68 y.o.   MRN: [id number]  HPI Virtual Visit via Video Note  I connected with the patient on 05/06/24 at  9:15 AM EDT by a video enabled telemedicine application and verified that I am speaking with the correct person using two identifiers.  Location patient: home Location provider:work or home office Persons participating in the virtual visit: patient, provider  I discussed the limitations of evaluation and management by telemedicine and the availability of in person appointments. The patient expressed understanding and agreed to proceed.   HPI: Here for a number of issues. First he has had a painful rash on the back of his neck for several weeks. Sometimes there is a little bloody drainage on his neck. Second he asks for help with sleep. He took Temazepam  for several years in the past and this helped a lot. Finally his anxiety has been out of control for several months. He gets so anxious whenever he leaves his house that he never goes out unless it it's necessary. He has been taking Xanax  for years.    ROS: See pertinent positives and negatives per HPI.  Past Medical History:  Diagnosis Date   Allergy    seasonal allergies   Anxiety    on meds   Chronic kidney disease    hx of kidney stones   Depression    on meds   ED (erectile dysfunction)    GERD (gastroesophageal reflux disease)    not on meds-diet controlled-uses OTC meds for tx   Heart murmur    at age 43 years old, heard x1 , never again   History of kidney stones    Hyperlipidemia    on meds   Hypertension    on meds   Hypothyroidism    hx of-was on meds-   PONV (postoperative nausea and vomiting)    Warts, genital     Past Surgical History:  Procedure Laterality Date   APPENDECTOMY  1975   COLONOSCOPY  11/08/2007   per Dr. Jakie, diverticulosis but no polyps, repeat in 10 yrs    CYSTECTOMY     EXTRACORPOREAL SHOCK WAVE  LITHOTRIPSY Left 10/29/2017   Procedure: LEFT EXTRACORPOREAL SHOCK WAVE LITHOTRIPSY (ESWL);  Surgeon: Matilda Senior, MD;  Location: [facility];  Service: Urology;  Laterality: Left;   HERNIA REPAIR  07/2017   umbilical hernia   KNEE ARTHROSCOPY Right 1993   LEFT HEART CATH AND CORONARY ANGIOGRAPHY N/A 02/12/2023   Procedure: LEFT HEART CATH AND CORONARY ANGIOGRAPHY;  Surgeon: [place], Peter M, MD;  Location: [facility] INVASIVE CV LAB;  Service: Cardiovascular;  Laterality: N/A;   TONSILLECTOMY     WISDOM TOOTH EXTRACTION  1990    Family History  Problem Relation Age of Onset   Hypertension Other        family hx   Hyperlipidemia Other        family hx   Colon polyps Neg Hx    Colon cancer Neg Hx    Esophageal cancer Neg Hx    Stomach cancer Neg Hx    Rectal cancer Neg Hx      Current Outpatient Medications:    acetaminophen  (TYLENOL ) 500 MG tablet, Take 500 mg by mouth every 6 (six) hours as needed for moderate pain., Disp: , Rfl:    ALPRAZolam  (XANAX ) 1 MG tablet, TAKE ONE TABLET BY MOUTH EVERY 6 HOURS AS NEEDED FOR ANXIETY, Disp: 120 tablet,  Rfl: 5   aspirin  81 MG chewable tablet, Chew 1 tablet (81 mg total) by mouth daily., Disp: , Rfl:    atorvastatin  (LIPITOR ) 80 MG tablet, Take 1 tablet (80 mg total) by mouth daily., Disp: 90 tablet, Rfl: 3   clopidogrel  (PLAVIX ) 75 MG tablet, Take 1 tablet (75 mg total) by mouth daily., Disp: 90 tablet, Rfl: 3   ezetimibe  (ZETIA ) 10 MG tablet, Take 1 tablet (10 mg total) by mouth daily., Disp: 90 tablet, Rfl: 3   losartan  (COZAAR ) 50 MG tablet, Take 1 tablet (50 mg total) by mouth daily., Disp: 90 tablet, Rfl: 3   ondansetron  (ZOFRAN ) 8 MG tablet, Take 1 tablet (8 mg total) by mouth every 8 (eight) hours as needed for nausea or vomiting., Disp: 60 tablet, Rfl: 2   testosterone  cypionate (DEPOTESTOSTERONE CYPIONATE) 200 MG/ML injection, INJECT 1 ML INTRAMUSCULARLY EVERY 7 DAYS, Disp: 4 mL, Rfl: 3   triamcinolone  cream (KENALOG ) 0.1 %, Apply 1  Application topically 2 (two) times daily as needed., Disp: 45 g, Rfl: 5   carvedilol  (COREG ) 3.125 MG tablet, Take 1 tablet (3.125 mg total) by mouth 2 (two) times daily. (Patient not taking: Reported on 05/06/2024), Disp: 180 tablet, Rfl: 3  EXAM:  VITALS per patient if applicable:  GENERAL: alert, oriented, appears well and in no acute distress  HEENT: atraumatic, conjunttiva clear, no obvious abnormalities on inspection of external nose and ears  NECK: normal movements of the head and neck  LUNGS: on inspection no signs of respiratory distress, breathing rate appears normal, no obvious gross SOB, gasping or wheezing  CV: no obvious cyanosis  MS: moves all visible extremities without noticeable abnormality  PSYCH/NEURO: pleasant and cooperative, no obvious depression or anxiety, speech and thought processing grossly intact  ASSESSMENT AND PLAN: For insomnia, he will try Temazepam  30 mg at  bedtime. For the anxiety, he will try Buspar  5 mg BID in addition to the Xanax . He also has a folliculitis on the neck, and we will treat this with 30 days of Doxycycline  BID. He will follow up with us  in 3 weeks.  Garnette Olmsted, MD  Discussed the following assessment and plan:  No diagnosis found.     I discussed the assessment and treatment plan with the patient. The patient was provided an opportunity to ask questions and all were answered. The patient agreed with the plan and demonstrated an understanding of the instructions.   The patient was advised to call back or seek an in-person evaluation if the symptoms worsen or if the condition fails to improve as anticipated.      Review of Systems     Objective:   Physical Exam        Assessment & Plan:

## 2024-05-09 ENCOUNTER — Encounter: Payer: Self-pay | Admitting: Family Medicine

## 2024-05-10 NOTE — Telephone Encounter (Signed)
 Tell him to increase the Buspirone  to two tablets (total of 10 mg) twice daily

## 2024-05-12 ENCOUNTER — Other Ambulatory Visit: Payer: Self-pay

## 2024-05-12 MED ORDER — BUSPIRONE HCL 10 MG PO TABS: ORAL_TABLET | ORAL | 1 refills | Status: DC

## 2024-05-27 ENCOUNTER — Ambulatory Visit: Admitting: Family Medicine

## 2024-05-31 ENCOUNTER — Ambulatory Visit: Admitting: Psychology

## 2024-05-31 DIAGNOSIS — F418 Other specified anxiety disorders: Secondary | ICD-10-CM | POA: Diagnosis not present
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
## 2024-06-13 ENCOUNTER — Other Ambulatory Visit: Payer: Self-pay

## 2024-06-13 ENCOUNTER — Emergency Department (HOSPITAL_COMMUNITY)

## 2024-06-13 ENCOUNTER — Encounter (HOSPITAL_COMMUNITY): Payer: Self-pay

## 2024-06-13 ENCOUNTER — Emergency Department (HOSPITAL_COMMUNITY)
Admission: EM | Admit: 2024-06-13 | Discharge: 2024-06-13 | Disposition: A | Discharge status: Home / Self Care | Attending: Emergency Medicine | Admitting: Emergency Medicine

## 2024-06-13 DIAGNOSIS — E039 Hypothyroidism, unspecified: Secondary | ICD-10-CM | POA: Diagnosis not present

## 2024-06-13 DIAGNOSIS — I129 Hypertensive chronic kidney disease with stage 1 through stage 4 chronic kidney disease, or unspecified chronic kidney disease: Secondary | ICD-10-CM | POA: Diagnosis not present

## 2024-06-13 DIAGNOSIS — Z79899 Other long term (current) drug therapy: Secondary | ICD-10-CM | POA: Insufficient documentation

## 2024-06-13 DIAGNOSIS — Z7982 Long term (current) use of aspirin: Secondary | ICD-10-CM | POA: Diagnosis not present

## 2024-06-13 DIAGNOSIS — N189 Chronic kidney disease, unspecified: Secondary | ICD-10-CM | POA: Insufficient documentation

## 2024-06-13 DIAGNOSIS — R0789 Other chest pain: Secondary | ICD-10-CM | POA: Insufficient documentation

## 2024-06-13 DIAGNOSIS — R000 Tachycardia, unspecified: Secondary | ICD-10-CM | POA: Diagnosis not present

## 2024-06-13 DIAGNOSIS — Z7902 Long term (current) use of antithrombotics/antiplatelets: Secondary | ICD-10-CM | POA: Insufficient documentation

## 2024-06-13 DIAGNOSIS — I252 Old myocardial infarction: Secondary | ICD-10-CM | POA: Diagnosis not present

## 2024-06-13 DIAGNOSIS — Z8673 Personal history of transient ischemic attack (TIA), and cerebral infarction without residual deficits: Secondary | ICD-10-CM | POA: Diagnosis not present

## 2024-06-13 DIAGNOSIS — F419 Anxiety disorder, unspecified: Secondary | ICD-10-CM | POA: Diagnosis not present

## 2024-06-13 LAB — CBC
HCT: 42.4 % (ref 39.0–52.0)
Hemoglobin: 13.9 g/dL (ref 13.0–17.0)
MCH: 31.4 pg (ref 26.0–34.0)
MCHC: 32.8 g/dL (ref 30.0–36.0)
MCV: 95.9 fL (ref 80.0–100.0)
Platelets: 270 K/uL (ref 150–400)
RBC: 4.42 MIL/uL (ref 4.22–5.81)
RDW: 13.5 % (ref 11.5–15.5)
WBC: 8.2 K/uL (ref 4.0–10.5)
nRBC: 0 % (ref 0.0–0.2)

## 2024-06-13 LAB — BASIC METABOLIC PANEL WITH GFR
Anion gap: 15 (ref 5–15)
BUN: 15 mg/dL (ref 8–23)
CO2: 20 mmol/L — ABNORMAL LOW (ref 22–32)
Calcium: 9.4 mg/dL (ref 8.9–10.3)
Chloride: 107 mmol/L (ref 98–111)
Creatinine, Ser: 1.27 mg/dL — ABNORMAL HIGH (ref 0.61–1.24)
GFR, Estimated: >60 mL/min (ref >60)
Glucose, Bld: 136 mg/dL — ABNORMAL HIGH (ref 70–99)
Potassium: 4.2 mmol/L (ref 3.5–5.1)
Sodium: 142 mmol/L (ref 135–145)

## 2024-06-13 LAB — D-DIMER, QUANTITATIVE: D-Dimer, Quant: <0.27 ug{FEU}/mL (ref 0.00–0.50)

## 2024-06-13 LAB — TROPONIN T, HIGH SENSITIVITY
Troponin T High Sensitivity: 57 ng/L — ABNORMAL HIGH (ref 0–19)
Troponin T High Sensitivity: 53 ng/L — ABNORMAL HIGH (ref 0–19)

## 2024-06-13 MED ORDER — LORAZEPAM 1 MG PO TABS
1.0000 mg | ORAL_TABLET | Freq: Once | ORAL | Status: AC
  Administered 2024-06-13: 1 mg via ORAL
  Filled 2024-06-13: qty 1

## 2024-06-13 MED ORDER — OXYCODONE-ACETAMINOPHEN 5-325 MG PO TABS
1.0000 | ORAL_TABLET | Freq: Once | ORAL | Status: AC
  Administered 2024-06-13: 1 via ORAL
  Filled 2024-06-13: qty 1

## 2024-06-13 NOTE — ED Triage Notes (Signed)
 Pt presents via POV c/o chest pain starting this am. Reports woke up this am with chest pain. Reports increased anxiety due to chest pain. 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

## 2024-06-13 NOTE — ED Provider Notes (Addendum)
 68 year old male with cardiac history presents emergency department today with atypical chest pain associated with some anxiety.  Initial troponin is mildly elevated.  Plan is for delta troponin and reevaluation for disposition. Physical Exam  BP (!) 134/103   Pulse 96   Temp 98.2 F (36.8 C) (Oral)   Resp (!) 33   SpO2 97%   Physical Exam General: No acute distress  Procedures  Procedures  ED Course / MDM    Medical Decision Making Amount and/or Complexity of Data Reviewed Labs: ordered. Radiology: ordered.  Risk Prescription drug management.   The patient's second troponin is downtrending and stable.  The patient is well-appearing.  Denies any symptoms currently.  Discussed admission versus outpatient follow-up.  With the patient's troponins being negative I do think that is reasonable to have him follow-up.  He does have an appoint with cardiology on the 27th.  He is encouraged to keep this and is discharged with return precautions.  Of note, the patient's respiratory rate has been documented as being elevated.  When I am assessing the patient in the room it is reading elevated on the monitor but the patient's respiratory rate is 16 and he is in no respiratory distress.       Ula Prentice SAUNDERS, MD 06/13/24 [id number]    Ula Prentice SAUNDERS, MD 06/13/24 [phone]

## 2024-06-13 NOTE — ED Provider Notes (Signed)
 [place] [facility]MENT AT [facility] Provider Note   CSN: [id number] Arrival date & time: 06/13/24  9464     Patient presents with: Chest Pain   IZZAK FRIES is a 68 y.o. male.   HPI     This is a 68 year old male with a history of CVA and MI who presents with chest discomfort.  Patient reports that he woke up on the couch after his roommate came home.  He went to bed but had difficulty going to sleep.  He states he began to feel panicked with anxiety.  He has a history of anxiety.  He states with the anxiety he began to develop some anterior chest discomfort.  He states it feels like a web across his chest.  Does not feel like when he had his attack previously.  No shortness of breath.  He states that currently he is without pain.  No recent fevers or cough.  No leg swelling or history of blood clots.  Prior to Admission medications   Medication Sig Start Date End Date Taking? Authorizing Provider  acetaminophen  (TYLENOL ) 500 MG tablet Take 500 mg by mouth every 6 (six) hours as needed for moderate pain.    [provider]  ALPRAZolam  (XANAX ) 1 MG tablet Take 1 tablet (1 mg total) by mouth every 6 (six) hours as needed for anxiety. for anxiety 05/06/24   Johnny Garnette LABOR, MD  aspirin  81 MG chewable tablet Chew 1 tablet (81 mg total) by mouth daily. 02/26/23   Daneen Damien BROCKS, NP  atorvastatin  (LIPITOR ) 80 MG tablet Take 1 tablet (80 mg total) by mouth daily. 10/13/23 10/12/24  Johnny Garnette LABOR, MD  busPIRone  (BUSPAR ) 10 MG tablet Take 10 mg twice daily 05/12/24   Johnny Garnette LABOR, MD  clopidogrel  (PLAVIX ) 75 MG tablet Take 1 tablet (75 mg total) by mouth daily. 06/08/23   Tobb, Kardie, DO  doxycycline  (VIBRA -TABS) 100 MG tablet Take 1 tablet (100 mg total) by mouth 2 (two) times daily. 05/06/24   Johnny Garnette LABOR, MD  ezetimibe  (ZETIA ) 10 MG tablet Take 1 tablet (10 mg total) by mouth daily. 12/18/23 05/06/24  Daneen Damien BROCKS, NP  losartan  (COZAAR ) 50 MG tablet Take 1  tablet (50 mg total) by mouth daily. 10/22/23   Daneen Damien BROCKS, NP  ondansetron  (ZOFRAN ) 8 MG tablet Take 1 tablet (8 mg total) by mouth every 8 (eight) hours as needed for nausea or vomiting. 10/13/23   Johnny Garnette LABOR, MD  temazepam  (RESTORIL ) 30 MG capsule Take 1 capsule (30 mg total) by mouth at bedtime as needed for sleep. 05/06/24   Johnny Garnette LABOR, MD  testosterone  cypionate (DEPOTESTOSTERONE CYPIONATE) 200 MG/ML injection INJECT 1 ML INTRAMUSCULARLY EVERY 7 DAYS 05/19/23   Johnny Garnette LABOR, MD  triamcinolone  cream (KENALOG ) 0.1 % Apply 1 Application topically 2 (two) times daily as needed. 09/03/22   Johnny Garnette LABOR, MD    Allergies: Patient has no known allergies.    Review of Systems  Constitutional:  Negative for fever.  Respiratory:  Negative for shortness of breath.   Cardiovascular:  Positive for chest pain. Negative for leg swelling.  Gastrointestinal:  Negative for abdominal pain.  All other systems reviewed and are negative.   Updated Vital Signs BP (!) 155/106   Pulse (!) 105   Temp 98.2 F (36.8 C) (Oral)   Resp (!) 30   SpO2 97%   Physical Exam Vitals and nursing note reviewed.  Constitutional:  Appearance: He is well-developed. He is obese. He is not ill-appearing.  HENT:     Head: Normocephalic and atraumatic.  Eyes:     Pupils: Pupils are equal, round, and reactive to light.  Cardiovascular:     Rate and Rhythm: Regular rhythm. Tachycardia present.     Heart sounds: Normal heart sounds. No murmur heard. Pulmonary:     Effort: Pulmonary effort is normal. No respiratory distress.     Breath sounds: Normal breath sounds. No wheezing.  Abdominal:     General: Bowel sounds are normal.     Palpations: Abdomen is soft.     Tenderness: There is no abdominal tenderness. There is no rebound.  Musculoskeletal:     Cervical back: Neck supple.     Right lower leg: No edema.     Left lower leg: No edema.  Lymphadenopathy:     Cervical: No cervical adenopathy.   Skin:    General: Skin is warm and dry.  Neurological:     Mental Status: He is alert and oriented to person, place, and time.     (all labs ordered are listed, but only abnormal results are displayed) Labs Reviewed  BASIC METABOLIC PANEL WITH GFR - Abnormal; Notable for the following components:      Result Value   CO2 20 (*)    Glucose, Bld 136 (*)    Creatinine, Ser 1.27 (*)    All other components within normal limits  TROPONIN T, HIGH SENSITIVITY - Abnormal; Notable for the following components:   Troponin T High Sensitivity 57 (*)    All other components within normal limits  CBC  D-DIMER, QUANTITATIVE    EKG: EKG Interpretation Date/Time:  Monday June 13 2024 05:43:10 EDT Ventricular Rate:  104 PR Interval:  182 QRS Duration:  98 QT Interval:  330 QTC Calculation: 434 R Axis:   119  Text Interpretation: Sinus tachycardia Right axis deviation Low voltage, precordial leads Confirmed by Bari Pfeiffer ([id number]) on 06/13/2024 5:44:19 AM  Radiology: No results found.   Procedures   Medications Ordered in the ED  oxyCODONE -acetaminophen  (PERCOCET/ROXICET) 5-325 MG per tablet 1 tablet (1 tablet Oral Given 06/13/24 [id number])                                    Medical Decision Making Amount and/or Complexity of Data Reviewed Labs: ordered. Radiology: ordered.  Risk Prescription drug management.   This patient presents to the ED for concern of chest pain, this involves an extensive number of treatment options, and is a complaint that carries with it a high risk of complications and morbidity.  I considered the following differential and admission for this acute, potentially life threatening condition.  The differential diagnosis includes ACS, PE, pneumothorax, pneumonia  MDM:    This is a 68 year old male who presents with chest pain.  He is nontoxic.  Vital signs notable for heart rate of 105.  Respirations 30.  He does appear anxious.  EKG does not show any  evidence of acute ischemia or arrhythmia.  He is low risk for PE and D-dimer is negative.  Troponin is 57.  No prior for comparison.  Will repeat and trend.  He is currently pain-free.  Chest x-ray is pending.  Patient will need repeat troponin and repeat assessment.  (Labs, imaging, consults)  Labs: I Ordered, and personally interpreted labs.  The pertinent results include: CBC, BMP, troponin,  D-dimer  Imaging Studies ordered: I ordered imaging studies including chest x-ray I independently visualized and interpreted imaging. I agree with the radiologist interpretation  Additional history obtained from chart review.  External records from outside source obtained and reviewed including prior evaluations  Cardiac Monitoring: The patient was maintained on a cardiac monitor.  If on the cardiac monitor, I personally viewed and interpreted the cardiac monitored which showed an underlying rhythm of: Sinus  Reevaluation: After the interventions noted above, I reevaluated the patient and found that they have :stayed the same  Social Determinants of Health:  lives independently  Disposition: Pending, signed out to oncoming provider  Co morbidities that complicate the patient evaluation  Past Medical History:  Diagnosis Date   Allergy    seasonal allergies   Anxiety    on meds   Chronic kidney disease    hx of kidney stones   Depression    on meds   ED (erectile dysfunction)    GERD (gastroesophageal reflux disease)    not on meds-diet controlled-uses OTC meds for tx   Heart murmur    at age 24 years old, heard x1 , never again   History of kidney stones    Hyperlipidemia    on meds   Hypertension    on meds   Hypothyroidism    hx of-was on meds-   PONV (postoperative nausea and vomiting)    Warts, genital      Medicines Meds ordered this encounter  Medications   oxyCODONE -acetaminophen  (PERCOCET/ROXICET) 5-325 MG per tablet 1 tablet    Refill:  0   LORazepam  (ATIVAN )  tablet 1 mg    I have reviewed the patients home medicines and have made adjustments as needed  Problem List / ED Course: Problem List Items Addressed This Visit   None            Final diagnoses:  None    ED Discharge Orders     None          Bari Charmaine FALCON, MD 06/13/24 [phone]

## 2024-06-13 NOTE — Discharge Instructions (Signed)
 Your workup today was reassuring.  Please follow-up with the cardiologist as planned and return to the ER for worsening symptoms.

## 2024-06-14 ENCOUNTER — Ambulatory Visit: Admitting: Psychology

## 2024-06-14 DIAGNOSIS — F418 Other specified anxiety disorders: Secondary | ICD-10-CM

## 2024-06-14 NOTE — Progress Notes (Unsigned)
 [facility] Counselor/Therapist Progress Note  Patient ID: NASZIR COTT, MRN: [id number]    Date: 06/14/24  Time Spent: 4:07 pm - 5:03 pm :  56 Minutes  Treatment Type: Individual Therapy.  Reported Symptoms: Depression & anxiety   Mental Status Exam: Appearance:  Casual     Behavior: Appropriate  Motor: Normal  Speech/Language:  Normal Rate  Affect: Congruent  Mood: dysthymic  Thought process: normal  Thought content:   WNL  Sensory/Perceptual disturbances:   WNL  Orientation: oriented to person, place, time/date, and situation  Attention: Fair  Concentration: Good  Memory: WNL  Fund of knowledge:  Good  Insight:   Good  Judgment:  Good  Impulse Control: Good   Risk Assessment: Danger to Self:  No Self-injurious Behavior: No Danger to Others: No Duty to Warn:no Physical Aggression / Violence:No  Access to Firearms a concern: No  Gang Involvement:No   Subjective:   [name] participated in the session, in person in the office with the therapist, and consented to treatment Amedee reviewed the events of the past week. Randi noted recently experiencing health related worries and noted suspecting a heart attack. He noted visiting the ER and noted that they could not identify any cardiological issues. Raydel noted being under the care of a cardiologist and will be following up. He noted often panicking when feeling weird in his chest. He noted his history of a stroke and a myocardial infarction. He noted that his anxiety and panic rising due to this when he feels weird. We worked on exploring this during the session and processing his experience. Isaiyah noted difficulty going out, by himself, and noted a need for socializing. He noted a need for more social support but noted a lack of social engagement. We worked on identifying possible barriers to fr engagement. He noted current barriers to engagement include a history of canceling social  commitments, lack of physical endurance, lack of energy during work-days, fear of rejection, and feeling self-conscious ( I am uncomfortable being fat). He noted his worry that people would judge him, leave a social outing upon seeing him. We worked on exploring this but could not identify any evidence for this. We worked on identifying ways to challenge this distortion going forward. Alcario noted socializing being a difficult proposition due the aforementioned barriers and noted a need to focus on managing household tasks. We worked on exploring barriers to this and identified over planning, lethargy, a lack of motivation, unattainable initial goals. We processed this during the session. Therapist introduced principles of BA and discussed the importance of scheduling tasks, setting reasonable initial expectations, breaking down larger tasks into smaller steps, and addressing barriers. Therapist encouraged Clinten to identify a task and break it down to more manageable steps. We will work on processing this going forward and creating a plan of action. Ruhan was engaged and motivated during the session and expressed commitment towards goals. Therapist praised Carnell for his effort during the session, acknowledged his efforts to be more physically active and encouraged [name] to identify possible changes in this are to support his goal of improving his health. A follow-up was scheduled for continued treatment, which Lashawn benefits from.   Interventions: CBT and BA.   Diagnosis:   Depression with anxiety  Psychiatric Treatment: Yes , via PCP. See chart.   Treatment Plan:  Client Abilities/Strengths Quill forthcoming and motivated for change.   Support System: Friends.   Client Treatment Preferences Outpatient therapy.  Client Statement of Needs Samaad would like to manage my anxiety, depression, and not wanting to leave the home. Increase socialization. Additional goals include increasing  mindfulness, engage in consistent self-care, proactive symptom management, develop a daily routine, managing rumination, and challenge negative self-talk and distortions.   Treatment Level Weekly  Symptoms  Anxiety: Feeling anxious, difficulty managing worry, worrying about different things, trouble relaxing, restlessness, irritability, feeling afraid something awful might happen.     (Status: maintained) Depression:  Loss of interest, feel down, poor sleep, lethargy, feeling bad about self, trouble concentration.  (Status: maintained)  Goals:   Aaran experiences symptoms of depression and anxiety.  Treatment plan signed and available on s-drive:  No, pending signature via MyChart.  JERICK KHACHATRYAN was sent the treatment plan signature form on 03/01/24.   Target Date: 03/01/25 Frequency: Weekly  Progress: 0 Modality: individual    Therapist will provide referrals for additional resources as appropriate.  Therapist will provide psycho-education regarding Octavian's diagnosis and corresponding treatment approaches and interventions. [name], LCSW will support the patient's ability to achieve the goals identified. will employ CBT, BA, Problem-solving, Solution Focused, Mindfulness,  coping skills, & other evidenced-based practices will be used to promote progress towards healthy functioning to help manage decrease symptoms associated with his diagnosis.   Reduce overall level, frequency, and intensity of the feelings of depression & anxiety evidenced by decreased overall from 6 to 7 days/week to 0 to 1 days/week per client report for at least 3 consecutive months. Verbally express understanding of the relationship between feelings of depression, anxiety and their impact on thinking patterns and behaviors. Verbalize an understanding of the role that distorted thinking plays in creating fears, excessive worry, and ruminations.  [name] participated in the creation of the treatment  plan)  [name], LCSW

## 2024-06-17 ENCOUNTER — Ambulatory Visit: Attending: Nurse Practitioner | Admitting: Nurse Practitioner

## 2024-06-17 VITALS — BP 128/94 | HR 99 | Ht 68.0 in | Wt 288.0 lb

## 2024-06-17 DIAGNOSIS — I10 Essential (primary) hypertension: Secondary | ICD-10-CM

## 2024-06-17 DIAGNOSIS — I5032 Chronic diastolic (congestive) heart failure: Secondary | ICD-10-CM

## 2024-06-17 DIAGNOSIS — I639 Cerebral infarction, unspecified: Secondary | ICD-10-CM | POA: Diagnosis not present

## 2024-06-17 DIAGNOSIS — N1831 Chronic kidney disease, stage 3a: Secondary | ICD-10-CM

## 2024-06-17 DIAGNOSIS — I2510 Atherosclerotic heart disease of native coronary artery without angina pectoris: Secondary | ICD-10-CM

## 2024-06-17 DIAGNOSIS — E039 Hypothyroidism, unspecified: Secondary | ICD-10-CM

## 2024-06-17 DIAGNOSIS — R001 Bradycardia, unspecified: Secondary | ICD-10-CM

## 2024-06-17 NOTE — Progress Notes (Signed)
 Office Visit    Patient Name: Richard Sellers Date of Encounter: 06/17/2024  Primary Care Provider:  Johnny Garnette LABOR, MD Primary Cardiologist:  Dub Huntsman, DO  Chief Complaint    68 year old male with a history of NSTEMI, nonobstructive CAD, possible myopericarditis, chronic diastolic heart failure, hypertension, hyperlipidemia, CVA, CKD stage IIIa, hypothyroidism, ED, psoriasis, anxiety, depression, and GERD who presents for follow-up related to CAD and hypertension.   Past Medical History    Past Medical History:  Diagnosis Date   Allergy    seasonal allergies   Anxiety    on meds   Chronic kidney disease    hx of kidney stones   Depression    on meds   ED (erectile dysfunction)    GERD (gastroesophageal reflux disease)    not on meds-diet controlled-uses OTC meds for tx   Heart murmur    at age 62 years old, heard x1 , never again   History of kidney stones    Hyperlipidemia    on meds   Hypertension    on meds   Hypothyroidism    hx of-was on meds-   PONV (postoperative nausea and vomiting)    Warts, genital    Past Surgical History:  Procedure Laterality Date   APPENDECTOMY  1975   COLONOSCOPY  11/08/2007   per Dr. Jakie, diverticulosis but no polyps, repeat in 10 yrs    CYSTECTOMY     EXTRACORPOREAL SHOCK WAVE LITHOTRIPSY Left 10/29/2017   Procedure: LEFT EXTRACORPOREAL SHOCK WAVE LITHOTRIPSY (ESWL);  Surgeon: Matilda Garnette, MD;  Location: [facility];  Service: Urology;  Laterality: Left;   HERNIA REPAIR  07/2017   umbilical hernia   KNEE ARTHROSCOPY Right 1993   LEFT HEART CATH AND CORONARY ANGIOGRAPHY N/A 02/12/2023   Procedure: LEFT HEART CATH AND CORONARY ANGIOGRAPHY;  Surgeon: [place], Peter M, MD;  Location: [facility] INVASIVE CV LAB;  Service: Cardiovascular;  Laterality: N/A;   TONSILLECTOMY     WISDOM TOOTH EXTRACTION  1990    Allergies  No Known Allergies   Labs/Other Studies Reviewed    The following studies were reviewed today:  Cardiac  Studies & Procedures   ______________________________________________________________________________________________ CARDIAC CATHETERIZATION  CARDIAC CATHETERIZATION 02/12/2023  Conclusion   Prox RCA to Mid RCA lesion is 20% stenosed.   LV end diastolic pressure is moderately elevated.  Mild nonobstructive CAD Moderately elevated LVEDP 28 mm Hg  Plan: patient has MINOCA possibly related to severe uncontrolled HTN. LVEDP is elevated. Will manage medically.  Findings Coronary Findings Diagnostic  Dominance: Right  Left Main Vessel was injected. Vessel is normal in caliber. Vessel is angiographically normal.  Left Anterior Descending Vessel was injected. Vessel is normal in caliber. The vessel exhibits minimal luminal irregularities.  Ramus Intermedius Vessel was injected. Vessel is small. Vessel is angiographically normal.  Left Circumflex Vessel was injected. Vessel is normal in caliber. The vessel exhibits minimal luminal irregularities.  Right Coronary Artery Prox RCA to Mid RCA lesion is 20% stenosed.  Intervention  No interventions have been documented.     ECHOCARDIOGRAM  ECHOCARDIOGRAM COMPLETE 05/17/2023  Narrative ECHOCARDIOGRAM REPORT    Patient Name:   Richard Sellers Date of Exam: 05/17/2023 Medical Rec #:  [id number]        Height:       68.0 in Accession #:    [id number]       Weight:       281.1 lb Date of Birth:  Jun 07, 1956  BSA:          2.362 m Patient Age:    66 years         BP:           127/86 mmHg Patient Gender: M                HR:           70 bpm. Exam Location:  Inpatient  Procedure: 2D Echo, Cardiac Doppler and Color Doppler  Indications:    stroke  History:        Patient has prior history of Echocardiogram examinations, most recent 02/11/2023. Signs/Symptoms:Edema; Risk Factors:Current Smoker and Hypertension.  Sonographer:    Tinnie Barefoot RDCS Referring Phys: [id number] ALEXIS HUGELMEYER   Sonographer Comments:  Patient is obese. Image acquisition challenging due to patient body habitus. IMPRESSIONS   1. Left ventricular ejection fraction, by estimation, is 55 to 60%. Left ventricular ejection fraction by PLAX is 57 %. The left ventricle has normal function. The left ventricle has no regional wall motion abnormalities. There is mild left ventricular hypertrophy. Left ventricular diastolic parameters are consistent with Grade I diastolic dysfunction (impaired relaxation). 2. Right ventricular systolic function is normal. The right ventricular size is normal. Tricuspid regurgitation signal is inadequate for assessing PA pressure. 3. Left atrial size was mildly dilated. 4. Right atrial size was mildly dilated. 5. The mitral valve is grossly normal. No evidence of mitral valve regurgitation. 6. The aortic valve is tricuspid. Aortic valve regurgitation is not visualized. 7. Aortic dilatation noted. There is mild dilatation of the ascending aorta, measuring 42 mm. 8. The inferior vena cava is normal in size with greater than 50% respiratory variability, suggesting right atrial pressure of 3 mmHg.  Comparison(s): No significant change from prior study. 02/11/2023: LVEF 55-60%, mild biatrial enlargement.  FINDINGS Left Ventricle: Left ventricular ejection fraction, by estimation, is 55 to 60%. Left ventricular ejection fraction by PLAX is 57 %. The left ventricle has normal function. The left ventricle has no regional wall motion abnormalities. The left ventricular internal cavity size was normal in size. There is mild left ventricular hypertrophy. Left ventricular diastolic parameters are consistent with Grade I diastolic dysfunction (impaired relaxation). Indeterminate filling pressures.  Right Ventricle: The right ventricular size is normal. No increase in right ventricular wall thickness. Right ventricular systolic function is normal. Tricuspid regurgitation signal is inadequate for assessing PA  pressure.  Left Atrium: Left atrial size was mildly dilated.  Right Atrium: Right atrial size was mildly dilated.  Pericardium: There is no evidence of pericardial effusion.  Mitral Valve: The mitral valve is grossly normal. No evidence of mitral valve regurgitation.  Tricuspid Valve: The tricuspid valve is normal in structure. Tricuspid valve regurgitation is not demonstrated.  Aortic Valve: The aortic valve is tricuspid. Aortic valve regurgitation is not visualized.  Pulmonic Valve: The pulmonic valve was normal in structure. Pulmonic valve regurgitation is not visualized.  Aorta: Aortic dilatation noted. There is mild dilatation of the ascending aorta, measuring 42 mm.  Venous: The inferior vena cava is normal in size with greater than 50% respiratory variability, suggesting right atrial pressure of 3 mmHg.  IAS/Shunts: No atrial level shunt detected by color flow Doppler.   LEFT VENTRICLE PLAX 2D LV EF:         Left            Diastology ventricular     LV e' medial:    8.38 cm/s ejection  LV E/e' medial:  9.8 fraction by     LV e' lateral:   7.18 cm/s PLAX is 57      LV E/e' lateral: 11.4 %. LVIDd:         5.00 cm LVIDs:         3.50 cm LV PW:         1.10 cm LV IVS:        1.10 cm LVOT diam:     1.90 cm LV SV:         69 LV SV Index:   29 LVOT Area:     2.84 cm   RIGHT VENTRICLE             IVC RV Basal diam:  3.00 cm     IVC diam: 1.60 cm RV S prime:     20.30 cm/s TAPSE (M-mode): 2.0 cm  LEFT ATRIUM             Index        RIGHT ATRIUM           Index LA diam:        3.40 cm 1.44 cm/m   RA Area:     13.80 cm LA Vol (A2C):   48.5 ml 20.54 ml/m  RA Volume:   31.50 ml  13.34 ml/m LA Vol (A4C):   42.8 ml 18.12 ml/m LA Biplane Vol: 45.9 ml 19.43 ml/m AORTIC VALVE LVOT Vmax:   127.00 cm/s LVOT Vmean:  84.400 cm/s LVOT VTI:    0.242 m  AORTA Ao Root diam: 3.30 cm Ao Asc diam:  4.20 cm  MITRAL VALVE MV Area (PHT): 2.83 cm    SHUNTS MV Decel  Time: 268 msec    Systemic VTI:  0.24 m MV E velocity: 81.90 cm/s  Systemic Diam: 1.90 cm MV A velocity: 99.40 cm/s MV E/A ratio:  0.82  Vinie Maxcy MD Electronically signed by Vinie Maxcy MD Signature Date/Time: 05/17/2023/11:33:52 AM    Final    MONITORS  CARDIAC EVENT MONITOR 07/03/2023  Narrative The patient wore the monitor for 30  days  starting 06/01/2023.  . Indication: Cryptogenic stroke  The minimum heart rate was 48 bpm, maximum heart rate was 112 bpm, and average heart rate was 69  bpm. Predominant underlying rhythm was Sinus Rhythm.   Premature atrial complexes were rare. Premature Ventricular complexes rare.  One pause noted lasting 3.9 seconds.  No AV block and no atrial fibrillation present.  Patient triggered events associated with sinus rhythm.  Conclusion: This study is showed evidence of 3.9 seconds pause. This pause is in the setting of low dose beta blocker.  Asymptomatic will optimize medication and stop beta blocker.       ______________________________________________________________________________________________     Recent Labs: 12/07/2023: TSH 3.350 01/28/2024: ALT 29 06/13/2024: BUN 15; Creatinine, Ser 1.27; Hemoglobin 13.9; Platelets 270; Potassium 4.2; Sodium 142  Recent Lipid Panel    Component Value Date/Time   CHOL 112 01/28/2024 1151   TRIG 127 01/28/2024 1151   HDL 40 01/28/2024 1151   CHOLHDL 2.8 01/28/2024 1151   CHOLHDL 6.2 05/17/2023 0452   VLDL 30 05/17/2023 0452   LDLCALC 49 01/28/2024 1151   LDLDIRECT 138.1 09/03/2007 1118    History of Present Illness    68 year old male with a history of NSTEMI, nonobstructive CAD, possible myopericarditis, chronic diastolic heart failure, hypertension, hyperlipidemia,  CVA, CKD stage IIIa, hypothyroidism, ED, psoriasis,anxiety, depression, and GERD who presents for follow-up related  to CAD and hypertension.    He was hospitalized in 01/2023 in the setting of NSTEMI. Cardiac  catheterization on 02/12/2023 revealed mild nonobstructive CAD, elevated LVEDP.  He reported nonadherence to his medication regimen prior to admission.  Echocardiogram showed EF 55 to 60%, akinetic apical anterior segment, G1 DD.  He received IV Lasix .  CRP was elevated. Cardiac MRI was deferred.  He was started on colchicine  0.6 mg daily given concern for myopericarditis.  Otherwise, his home medications were resumed. He contacted our office in July 2024 with concern for fatigue, myalgias.  He was advised to stop colchicine  given increased risk for rhabdomyolysis with chronic combined atorvastatin .  He never discontinued this medication.  Additionally, carvedilol  was decreased to 12.5 mg twice daily.  He was hospitalized in 04/2023 in the setting of CVA.  Echocardiogram at the time showed EF 55 to 60%, normal LV function, no RWMA, mild LVH, G1 DD, normal RV systolic function, no significant valvular abnormalities, mild dilation of the ascending aorta measuring 42 mm, no significant change from prior study.  Outpatient cardiac monitor revealed minimum heart rate 48 bpm, maximal heart rate 112 bpm, average heart rate 69 bpm, predominantly sinus rhythm, PACs and PVCs, 1 pause lasting 3.9 seconds, no significant abnormalities. Carvedilol  was decreased to 3.125 mg twice daily.  He was started on Zetia .  He was last seen in the office on 04/19/2024 and was stable from a cardiac standpoint.  Carvedilol  was discontinued.  He did note generalized fatigue. He presented to the ED on 06/13/2024 in the setting of chest pain.  Troponin was mildly elevated with downward trend.  His symptoms resolved, he was discharged home and advised to follow-up with cardiology as an outpatient.   He presents today for follow-up. Since his last visit and recent ED visit he has been stable from a cardiac standpoint.  He denies any further chest discomfort.  He continues to experience significant depression and anxiety, he is following with a  therapist, which has been helpful.  Home Medications    Current Outpatient Medications  Medication Sig Dispense Refill   acetaminophen  (TYLENOL ) 500 MG tablet Take 500 mg by mouth every 6 (six) hours as needed for moderate pain.     ALPRAZolam  (XANAX ) 1 MG tablet Take 1 tablet (1 mg total) by mouth every 6 (six) hours as needed for anxiety. for anxiety 120 tablet 5   aspirin  81 MG chewable tablet Chew 1 tablet (81 mg total) by mouth daily.     atorvastatin  (LIPITOR ) 80 MG tablet Take 1 tablet (80 mg total) by mouth daily. 90 tablet 3   busPIRone  (BUSPAR ) 10 MG tablet Take 10 mg twice daily 60 tablet 1   clopidogrel  (PLAVIX ) 75 MG tablet Take 1 tablet (75 mg total) by mouth daily. 90 tablet 3   ezetimibe  (ZETIA ) 10 MG tablet Take 1 tablet (10 mg total) by mouth daily. 90 tablet 3   losartan  (COZAAR ) 50 MG tablet Take 1 tablet (50 mg total) by mouth daily. 90 tablet 3   temazepam  (RESTORIL ) 30 MG capsule Take 1 capsule (30 mg total) by mouth at bedtime as needed for sleep. 30 capsule 5   testosterone  cypionate (DEPOTESTOSTERONE CYPIONATE) 200 MG/ML injection INJECT 1 ML INTRAMUSCULARLY EVERY 7 DAYS 4 mL 3   triamcinolone  cream (KENALOG ) 0.1 % Apply 1 Application topically 2 (two) times daily as needed. 45 g 5   doxycycline  (VIBRA -TABS) 100 MG tablet Take 1 tablet (100 mg total) by mouth 2 (two) times  daily. (Patient not taking: Reported on 06/17/2024) 60 tablet 2   ondansetron  (ZOFRAN ) 8 MG tablet Take 1 tablet (8 mg total) by mouth every 8 (eight) hours as needed for nausea or vomiting. (Patient not taking: Reported on 06/17/2024) 60 tablet 2   No current facility-administered medications for this visit.     Review of Systems    He denies chest pain, palpitations, pnd, orthopnea, n, v, dizziness, syncope, edema, weight gain, or early satiety. All other systems reviewed and are otherwise negative except as noted above.   Physical Exam    VS:  BP (!) 128/94 (BP Location: Right Arm, Patient  Position: Sitting, Cuff Size: Large)   Pulse 99   Ht 5' 8 (1.727 m)   Wt 288 lb (130.6 kg)   SpO2 93%   BMI 43.79 kg/m  GEN: Well nourished, well developed, in no acute distress. HEENT: normal. Neck: Supple, no JVD, carotid bruits, or masses. Cardiac: RRR, no murmurs, rubs, or gallops. No clubbing, cyanosis, edema.  Radials/DP/PT 2+ and equal bilaterally.  Respiratory:  Respirations regular and unlabored, clear to auscultation bilaterally. GI: Soft, nontender, nondistended, BS + x 4. MS: no deformity or atrophy. Skin: warm and dry, no rash. Neuro:  Strength and sensation are intact. Psych: Normal affect.  Accessory Clinical Findings    ECG personally reviewed by me today - EKG Interpretation Date/Time:  Friday June 17 2024 14:52:26 EDT Ventricular Rate:  99 PR Interval:  176 QRS Duration:  94 QT Interval:  348 QTC Calculation: 446 R Axis:   115  Text Interpretation: Normal sinus rhythm Incomplete right bundle branch block Possible Right ventricular hypertrophy When compared with ECG of 13-Jun-2024 05:43, PREVIOUS ECG IS PRESENT Confirmed by Daneen Perkins ([id number]) on 06/17/2024 2:56:06 PM  - no acute changes.   Lab Results  Component Value Date   WBC 8.2 06/13/2024   HGB 13.9 06/13/2024   HCT 42.4 06/13/2024   MCV 95.9 06/13/2024   PLT 270 06/13/2024   Lab Results  Component Value Date   CREATININE 1.27 (H) 06/13/2024   BUN 15 06/13/2024   NA 142 06/13/2024   K 4.2 06/13/2024   CL 107 06/13/2024   CO2 20 (L) 06/13/2024   Lab Results  Component Value Date   ALT 29 01/28/2024   AST 29 01/28/2024   ALKPHOS 113 01/28/2024   BILITOT 0.6 01/28/2024   Lab Results  Component Value Date   CHOL 112 01/28/2024   HDL 40 01/28/2024   LDLCALC 49 01/28/2024   LDLDIRECT 138.1 09/03/2007   TRIG 127 01/28/2024   CHOLHDL 2.8 01/28/2024    Lab Results  Component Value Date   HGBA1C 5.9 (H) 05/17/2023    Assessment & Plan    1. Nonobstructive CAD: Cath in 02/12/2023  revealed mild nonobstructive CAD, elevated LVEDP. Recent ED visit in the setting of chest pain.  Troponin was mildly elevated with downward trend  He denies any further chest discomfort.  He does note ongoing generalized fatigue, mild dyspnea on exertion. We discussed possible ischemic evaluation including coronary CT angiogram versus chemical stress test, he declines any further testing today. Reviewed ED precautions.  Continue to monitor symptoms.  Continue Aspirin , Plavix , losartan , Lipitor , and Zetia .    2. Cryptogenic stroke: Hospitalized in 04/2023 in the setting of acute CVA. Echocardiogram at the time showed EF 55 to 60%, normal LV function, no RWMA, mild LVH, G1 DD, normal RV systolic function, no significant valvular abnormalities, mild dilation of the ascending aorta  measuring 42 mm, no significant change from prior study.  Outpatient cardiac monitor revealed minimum heart rate 48 bpm, maximal heart rate 112 bpm, average heart rate 69 bpm, predominantly sinus rhythm, PACs and PVCs, 1 pause lasting 3.9 seconds, no significant abnormalities. No recurrence, no residual deficit. Continue Aspirin , Plavix , Lipitor , Zetia .    3. Chronic diastolic heart failure: Most recent echo as above.  Stable mild dyspnea on exertion.  Euvolemic and well compensated on exam.  No indication for loop diuretic at this time.  Continue losartan .   4. Sinus bradycardia/sinus pause: Noted on most recent cardiac monitor as above.  He denies any presyncope or syncope.  Carvedilol  was discontinued.   5. Hypertension: Losartan  and carvedilol  were previously decreased in the setting of hypotension.  Carvedilol  was ultimately discontinued.  BP has been stable. Continue to monitor BP and report BP consistently greater than 140/80, SBP consistently less than 100.  For now, continue losartan  at current dose.   5. Hyperlipidemia: LDL was 49 in 01/2024.  Continue Lipitor , Zetia .   6. CKD stage IIIa: Creatinine was stable at 1.27 in  05/2024.    7. Hypothyroidism: TSH was 3.35 in 11/2023. Managed per PCP.   8. Anxiety/Depression/ETOH use: Reports feelings of both anxiety and depression, alcohol use.  He is seeing a therapist.  He reports it has been helpful.  Recommend follow-up with PCP, continue to monitor.   9. Disposition: Follow-up in 2 to 3 months, sooner if needed.     Damien JAYSON Braver, NP 06/17/2024, 2:56 PM

## 2024-06-17 NOTE — Patient Instructions (Signed)
 Medication Instructions:  Your physician recommends that you continue on your current medications as directed. Please refer to the Current Medication list given to you today.  *If you need a refill on your cardiac medications before your next appointment, please call your pharmacy*  Lab Work: NONE ordered at this time of appointment   Testing/Procedures: NONE ordered at this time of appointment   Follow-Up: At [facility], you and your health needs are our priority.  As part of our continuing mission to provide you with exceptional heart care, our providers are all part of one team.  This team includes your primary Cardiologist (physician) and Advanced Practice Providers or APPs (Physician Assistants and Nurse Practitioners) who all work together to provide you with the care you need, when you need it.  Your next appointment:   2-3 month(s)  Provider:   Damien Braver NP  We recommend signing up for the patient portal called MyChart.  Sign up information is provided on this After Visit Summary.  MyChart is used to connect with patients for Virtual Visits (Telemedicine).  Patients are able to view lab/test results, encounter notes, upcoming appointments, etc.  Non-urgent messages can be sent to your provider as well.   To learn more about what you can do with MyChart, go to [email or web address].

## 2024-06-20 ENCOUNTER — Encounter: Payer: Self-pay | Admitting: Nurse Practitioner

## 2024-06-22 ENCOUNTER — Encounter: Payer: Self-pay | Admitting: Family Medicine

## 2024-06-22 DIAGNOSIS — R109 Unspecified abdominal pain: Secondary | ICD-10-CM

## 2024-06-23 NOTE — Telephone Encounter (Signed)
 I did a referral to GI

## 2024-06-24 ENCOUNTER — Other Ambulatory Visit: Payer: Self-pay

## 2024-06-24 MED ORDER — ONDANSETRON HCL 8 MG PO TABS: 8.0000 mg | ORAL_TABLET | Freq: Three times a day (TID) | ORAL | 2 refills | Status: AC | PRN

## 2024-07-04 ENCOUNTER — Other Ambulatory Visit: Payer: Self-pay | Admitting: Cardiology

## 2024-07-05 ENCOUNTER — Ambulatory Visit: Admitting: Psychology

## 2024-07-05 DIAGNOSIS — F418 Other specified anxiety disorders: Secondary | ICD-10-CM | POA: Diagnosis not present
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
## 2024-07-12 ENCOUNTER — Encounter: Payer: Self-pay | Admitting: Family Medicine

## 2024-07-19 ENCOUNTER — Ambulatory Visit: Admitting: Psychology

## 2024-07-21 ENCOUNTER — Other Ambulatory Visit: Payer: Self-pay | Admitting: Family Medicine

## 2024-07-22 ENCOUNTER — Other Ambulatory Visit: Payer: Self-pay | Admitting: Cardiology

## 2024-07-22 MED ORDER — LOSARTAN POTASSIUM 50 MG PO TABS: 50.0000 mg | ORAL_TABLET | Freq: Every day | ORAL | 3 refills | Status: DC

## 2024-07-22 MED ORDER — CLOPIDOGREL BISULFATE 75 MG PO TABS: 75.0000 mg | ORAL_TABLET | Freq: Every day | ORAL | 3 refills | Status: AC

## 2024-07-23 ENCOUNTER — Encounter: Payer: Self-pay | Admitting: Family Medicine

## 2024-07-26 ENCOUNTER — Ambulatory Visit: Admitting: Psychology

## 2024-07-26 DIAGNOSIS — F418 Other specified anxiety disorders: Secondary | ICD-10-CM | POA: Diagnosis not present

## 2024-07-26 NOTE — Progress Notes (Signed)
 Richard Sellers  Patient ID: Richard Sellers, MRN: [id number]    Date: 07/26/24  Time Spent: 1:02 pm - 2:02 pm :  60 Minutes  Treatment Type: Individual Therapy.  Reported Symptoms: Depression & anxiety   Mental Status Exam: Appearance:  Casual     Behavior: Appropriate  Motor: Normal  Speech/Language:  Normal Rate  Affect: Tearful  Mood: dysthymic  Thought process: normal  Thought content:   WNL  Sensory/Perceptual disturbances:   WNL  Orientation: oriented to person, place, time/date, and situation  Attention: Fair  Concentration: Good  Memory: WNL  Fund of knowledge:  Good  Insight:   Good  Judgment:  Good  Impulse Control: Good   Risk Assessment: Danger to Self:  No Self-injurious Behavior: No Danger to Others: No Duty to Warn:no Physical Aggression / Violence:No  Access to Firearms a concern: No  Gang Involvement:No   Subjective:   Richard Sellers participated in the session, in person in the office with the therapist, and consented to treatment Richard Sellers reviewed the events of the past week. Richard Sellers noted recently being sick and noted stress related to new management at work. He noted the new management at his job and noted various stressors. He noted an unclear work schedule, being treated like I am stupid, and not generally being treated poorly by new staff. He provided feedback regarding recent interactions. He noted a general uncertainty about his job, as a result. He noted this causing him anxiety, especially without clarity regarding the weekly work schedule. 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 He noted his various attempts, in the past, to advocate for self. He noted this being ineffectual overall and discussed having a similar experience with various managers. He noted consideration of finding a different job but noted that his experience in a singular field and  age could be barriers. He noted recently requesting that a friend, who owns a computer, help him with updating his resume and applying to various jobs. We worked processing his feelings regarding his stressors. He noted frustration regarding his brother's lack of action regarding their mother's ashes. He noted his brother having more mental health issues than I do. He noted his brother's refusal to help with this process and his refusal to pay for a portion of the process. He provided background regarding the dynamics between him and his brother, along with their separate relationships with his mother. He noted his mother changing her will, prior to her death, and Richard Sellers noted confusion regarding this and noted wanting to know why more than wanting to be in the will. He recalled the events prior to her passing and being there prior to her passing. Therapist validated Richard Sellers's feelings and experience, during the session, and provided supportive therapy. A follow-up was scheduled for continued treatment, which he benefits from.    Interventions: CBT and grief and loss.  Diagnosis:   Depression with anxiety  Psychiatric Treatment: Yes , via PCP. See chart.   Treatment Plan:  Client Abilities/Strengths Richard Sellers and motivated for change.   Support System: Friends.   Client Treatment Preferences Outpatient therapy.   Client Statement of Needs Richard Sellers would like to manage my anxiety, depression, and not wanting to leave the home. Increase socialization. Additional goals include increasing mindfulness, engage in consistent self-care, proactive symptom management, develop a daily routine, managing rumination, and challenge negative self-talk and distortions.   Treatment Level Weekly  Symptoms  Anxiety: Feeling anxious, difficulty managing worry, worrying about different things, trouble relaxing, restlessness, irritability, feeling afraid something awful might happen.     (Status:  maintained) Depression:  Loss of interest, feel down, poor sleep, lethargy, feeling bad about self, trouble concentration.  (Status: maintained)  Goals:   Richard Sellers experiences symptoms of depression and anxiety.  Treatment plan signed and available on s-drive:  No, pending signature via MyChart.  Richard Sellers was sent the treatment plan signature form on 03/01/24.   Target Date: 03/01/25 Frequency: Weekly  Progress: 0 Modality: individual    Therapist will provide referrals for additional resources as appropriate.  Therapist will provide psycho-education regarding Kamdon's diagnosis and corresponding treatment approaches and interventions. [name], LCSW will support the patient's ability to achieve the goals identified. will employ CBT, BA, Problem-solving, Solution Focused, Mindfulness,  coping skills, & other evidenced-based practices will be used to promote progress towards healthy functioning to help manage decrease symptoms associated with his diagnosis.   Reduce overall level, frequency, and intensity of the feelings of depression & anxiety evidenced by decreased overall from 6 to 7 days/week to 0 to 1 days/week per client report for at least 3 consecutive months. Verbally express understanding of the relationship between feelings of depression, anxiety and their impact on thinking patterns and behaviors. Verbalize an understanding of the role that distorted thinking plays in creating fears, excessive worry, and ruminations.  Richard Sellers participated in the creation of the treatment plan)  [name], LCSW

## 2024-08-15 ENCOUNTER — Encounter: Payer: Self-pay | Admitting: Family Medicine

## 2024-08-15 NOTE — Telephone Encounter (Signed)
 Take 1/2  a tablet (5 mg) BID for one week, then 1/2 tablet once a day for one week ,then stop

## 2024-08-16 ENCOUNTER — Ambulatory Visit: Admitting: Psychology

## 2024-08-16 DIAGNOSIS — F418 Other specified anxiety disorders: Secondary | ICD-10-CM | POA: Diagnosis not present

## 2024-08-16 NOTE — Progress Notes (Signed)
 [facility] Counselor/Therapist Progress Note  Patient ID: Richard Sellers, MRN: [id number]    Date: 08/16/24  Time Spent: 3:02 pm - 4:02 pm :  60 Minutes  Treatment Type: Individual Therapy.  Reported Symptoms: Depression & anxiety   Mental Status Exam: Appearance:  Casual     Behavior: Appropriate  Motor: Normal  Speech/Language:  Normal Rate  Affect: Congruent  Mood: dysthymic  Thought process: normal  Thought content:   WNL  Sensory/Perceptual disturbances:   WNL  Orientation: oriented to person, place, time/date, and situation  Attention: Fair  Concentration: Good  Memory: WNL  Fund of knowledge:  Good  Insight:   Good  Judgment:  Good  Impulse Control: Good   Risk Assessment: Danger to Self:  No Self-injurious Behavior: No Danger to Others: No Duty to Warn:no Physical Aggression / Violence:No  Access to Firearms a concern: No  Gang Involvement:No   Subjective:   Richard Sellers participated in the session, in person in the office with the therapist, and consented to treatment Richard Sellers reviewed the events of the past week. He noted not feeling well and discussed recently experiencing nightmares. He noted attributing this due to his anxiolytic. We worked on exploring this and couldn't not identify any evidence for this assertion. He has spoken with his provider and is now only taking his medication in the AM going forward. He noted this time of year can be lonely but noted being invited to a friend's home for the thanksgiving. He noted at times, often feeling alone, despite having friends from work and outside of work, having plans with various family and friends during the holiday season, and regularly socializing on the phone. We worked on exploring this during the session. He noted that he isn't the most important people in peoples lives. He noted not being as important in other people's lives. He noted feeling a priority, at times, for a few minutes  but that this feeling does not last very long. We will continue to explore and process this during the session. He noted I shut myself away for so long, that people quit asking. He noted that he often does not initiate with others. Other barriers include his weight-gain and self-image. He noted considering weight-loss medication. He noted typically an all or nothing approach for diet and  exercise. We will continue to explore this going forward. Therapist validated Richard Sellers's feelings and experience, during the session, and provided supportive therapy. A follow-up was scheduled for continued treatment, which Richard Sellers benefits from.   Interventions: CBT and grief and loss.  Diagnosis:   Depression with anxiety  Psychiatric Treatment: Yes , via PCP. See chart.   Treatment Plan:  Client Abilities/Strengths Richard Sellers forthcoming and motivated for change.   Support System: Friends.   Client Treatment Preferences Outpatient therapy.   Client Statement of Needs Richard Sellers would like to manage my anxiety, depression, and not wanting to leave the home. Increase socialization. Additional goals include increasing mindfulness, engage in consistent self-care, proactive symptom management, develop a daily routine, managing rumination, and challenge negative self-talk and distortions.   Treatment Level Weekly  Symptoms  Anxiety: Feeling anxious, difficulty managing worry, worrying about different things, trouble relaxing, restlessness, irritability, feeling afraid something awful might happen.     (Status: maintained) Depression:  Loss of interest, feel down, poor sleep, lethargy, feeling bad about self, trouble concentration.  (Status: maintained)  Goals:   Richard Sellers experiences symptoms of depression and anxiety.  Treatment plan signed and available on  s-drive:  No, pending signature via MyChart.  Richard Sellers was sent the treatment plan signature form on 03/01/24.   Target Date: 03/01/25  Frequency: Weekly  Progress: 0 Modality: individual    Therapist will provide referrals for additional resources as appropriate.  Therapist will provide psycho-education regarding Richard Sellers's diagnosis and corresponding treatment approaches and interventions. Richard Mullet, LCSW will support the patient's ability to achieve the goals identified. will employ CBT, BA, Problem-solving, Solution Focused, Mindfulness,  coping skills, & other evidenced-based practices will be used to promote progress towards healthy functioning to help manage decrease symptoms associated with his diagnosis.   Reduce overall level, frequency, and intensity of the feelings of depression & anxiety evidenced by decreased overall from 6 to 7 days/week to 0 to 1 days/week per client report for at least 3 consecutive months. Verbally express understanding of the relationship between feelings of depression, anxiety and their impact on thinking patterns and behaviors. Verbalize an understanding of the role that distorted thinking plays in creating fears, excessive worry, and ruminations.  Richard Sellers participated in the creation of the treatment plan)  Richard Mullet, LCSW

## 2024-08-16 NOTE — Telephone Encounter (Signed)
 Yes, that would be fine.

## 2024-08-28 DIAGNOSIS — R0981 Nasal congestion: Secondary | ICD-10-CM | POA: Diagnosis not present

## 2024-08-28 DIAGNOSIS — R051 Acute cough: Secondary | ICD-10-CM | POA: Diagnosis not present

## 2024-08-28 DIAGNOSIS — R109 Unspecified abdominal pain: Secondary | ICD-10-CM | POA: Diagnosis not present

## 2024-08-28 DIAGNOSIS — B349 Viral infection, unspecified: Secondary | ICD-10-CM | POA: Diagnosis not present

## 2024-08-28 DIAGNOSIS — R197 Diarrhea, unspecified: Secondary | ICD-10-CM | POA: Diagnosis not present

## 2024-09-02 ENCOUNTER — Ambulatory Visit: Admitting: Nurse Practitioner

## 2024-09-08 ENCOUNTER — Ambulatory Visit: Admitting: Psychology

## 2024-09-21 ENCOUNTER — Ambulatory Visit: Admitting: Psychology

## 2024-09-21 DIAGNOSIS — F418 Other specified anxiety disorders: Secondary | ICD-10-CM | POA: Diagnosis not present

## 2024-09-21 NOTE — Progress Notes (Signed)
 [facility] Counselor/Therapist Progress Note  Patient ID: Richard Sellers, MRN: [id number]    Date: 09/21/2024  Time Spent: 11:00 am - 11:49 am : 49 Minutes  Treatment Type: Individual Therapy.  Reported Symptoms: Depression & anxiety   Mental Status Exam: Appearance:  Casual     Behavior: Appropriate  Motor: Normal  Speech/Language:  Normal Rate  Affect: Congruent  Mood: dysthymic  Thought process: normal  Thought content:   WNL  Sensory/Perceptual disturbances:   WNL  Orientation: oriented to person, place, time/date, and situation  Attention: Fair  Concentration: Good  Memory: WNL  Fund of knowledge:  Good  Insight:   Good  Judgment:  Good  Impulse Control: Good   Risk Assessment: Danger to Self:  No Self-injurious Behavior: No Danger to Others: No Duty to Warn:no Physical Aggression / Violence:No  Access to Firearms a concern: No  Gang Involvement:No   Subjective:   Richard Sellers participated in the session, initially via video, from home and consented to treatment. Therapist participated from home office. We experienced significant technical difficulties during the start of the session and reverted to a phone session. >50% of the session was via phone. Vick reviewed the events of the past week. Richard Sellers noted requesting this appointment due to his mood. Therapist praised [employer] for contacting office and advocating for self. Richard Sellers noted feeling physically well and noted feeling sad. He noted not spending much time with friends until after the holidays. He noted his effort to socialize and spend time with friends. He noted working on getting exercise, as well. He noted a sense of loneliness and noted a need to increase his socialization. We worked on identifying ways to become more social including reconnecting with friends, visiting a senior center.  Therapist encouraged Richard Sellers to create simple schedule that includes exercise socializing, and identifying  movements are present and identifying movements of gratitude.  Therapist discussed the importance of self-care in relation to mood management and provided in-depth psychoeducation.  Therapist normalized chest feelings of grief during the holiday season.  We worked on identifying ways to manage this going forward.  Therapist discussed the importance of creating plan better achievable, cleaning schedule, problem solving, and making alternative plans.  Therapist modeled this during the session, applying behavioral activation skills during the session.  Richard Sellers denied any thoughts of self-harm during session.Therapist validated and normalized Richard Sellers's feelings and experience, encouraged self-care and work towards goals, discussed the importance of maintaining perspective that reflects his experience as a whole.  Richard Sellers was engaged and motivated during the session and expressed to them toward goals.  Therapist praised Richard Sellers for his effort during session and provided supportive therapy.  Follow-up as scheduled for continued treatment with Richard Sellers See from.  Richard Sellers will call, ahead of time, should the need arise for an earlier appointment.  Interventions: CBT and grief and loss.  Diagnosis:   Depression with anxiety  Psychiatric Treatment: Yes , via PCP. See chart.   Treatment Plan:  Client Abilities/Strengths Richard Sellers forthcoming and motivated for change.   Support System: Friends.   Client Treatment Preferences Outpatient therapy.   Client Statement of Needs Richard Sellers would like to manage my anxiety, depression, and not wanting to leave the home. Increase socialization. Additional goals include increasing mindfulness, engage in consistent self-care, proactive symptom management, develop a daily routine, managing rumination, and challenge negative self-talk and distortions.   Treatment Level Weekly  Symptoms  Anxiety: Feeling anxious, difficulty managing worry, worrying about different things, trouble  relaxing, restlessness, irritability, feeling afraid something awful might happen.     (Status: maintained) Depression:  Loss of interest, feel down, poor sleep, lethargy, feeling bad about self, trouble concentration.  (Status: maintained)  Goals:   Richard Sellers experiences symptoms of depression and anxiety.  Treatment plan signed and available on s-drive:  No, pending signature via MyChart.  Richard Sellers was sent the treatment plan signature form on 03/01/24.   Target Date: 03/01/25 Frequency: Weekly  Progress: 0 Modality: individual    Therapist will provide referrals for additional resources as appropriate.  Therapist will provide psycho-education regarding [name]'s diagnosis and corresponding treatment approaches and interventions. [name], LCSW will support the patient's ability to achieve the goals identified. will employ CBT, BA, Problem-solving, Solution Focused, Mindfulness,  coping skills, & other evidenced-based practices will be used to promote progress towards healthy functioning to help manage decrease symptoms associated with his diagnosis.   Reduce overall level, frequency, and intensity of the feelings of depression & anxiety evidenced by decreased overall from 6 to 7 days/week to 0 to 1 days/week per client report for at least 3 consecutive months. Verbally express understanding of the relationship between feelings of depression, anxiety and their impact on thinking patterns and behaviors. Verbalize an understanding of the role that distorted thinking plays in creating fears, excessive worry, and ruminations.  Richard Sellers participated in the creation of the treatment plan)  [name], LCSW

## 2024-09-26 ENCOUNTER — Ambulatory Visit: Payer: Self-pay | Admitting: *Deleted

## 2024-09-26 NOTE — Telephone Encounter (Signed)
 FYI Only or Action Required?: FYI only for provider: appointment scheduled on 09/28/24.  Patient was last seen in primary care on 05/06/2024 by Johnny Garnette LABOR, MD.  Called Nurse Triage reporting Nasal Congestion and Ear Fullness.  Symptoms began several days ago.  Interventions attempted: Rest, hydration, or home remedies.  Symptoms are: gradually worsening.  Triage Disposition: See PCP When Office is Open (Within 3 Days)  Patient/caregiver understands and will follow disposition?: yes  Copied from CRM #[id number]. Topic: Clinical - Red Word Triage >> Sep 26, 2024 10:50 AM Donna BRAVO wrote: Red Word that prompted transfer to Nurse Triage:  -sinus problems 2 weeks -can't hear out of right ear -anxiety out of control  -thoughts about admitting himself for metal health     Reason for Disposition  [1] Ear congestion lasts > 3 days AND [2] no improvement after using Care Advice  (Exception: Ear congestion is a chronic symptom.)  MODERATE anxiety (e.g., persistent or frequent anxiety symptoms; interferes with sleep, school, or work)  Answer Assessment - Initial Assessment Questions Patient states he had  increased ear congestion and pressure for 2 weeks- patient also states he is having increased anxiety and 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 Patient is requesting appointment for both problems- patient would like appointment tomorrow with PCP-no open appointment- patient has been scheduled for Wednesday.   1. LOCATION: Which ear is involved?       R ear- but can effect the L ear 2. SENSATION: Describe how the ear feels. (e.g., stuffy, full, plugged).      Hx allergies- comes and goes- feels clogged 3. ONSET:  When did the ear symptoms start?       2 weeks 4. PAIN: Do you also have an earache? If Yes, ask: How bad is it? (Scale 0-10; none, mild, moderate or severe)     headache 5. CAUSE: What do you think is causing the ear congestion? (e.g., common cold, nasal  allergies, recent flight, recent snorkeling)     Nasal allergies, ear clogged 6. OTHER SYMPTOMS: Do you have any other symptoms? (e.g., ear drainage, hay fever symptoms such as sneezing or a clear nasal discharge; cold symptoms such as a cough or runny nose)     Nasal drainage, chest congestion  Answer Assessment - Initial Assessment Questions 1. CONCERN: Did anything happen that prompted you to call today?      Work stress is worse 2. ANXIETY SYMPTOMS: Can you describe how you (your loved one; patient) have been feeling? (e.g., tense, restless, panicky, anxious, keyed up, overwhelmed, sense of impending doom).      Increased stress 3. ONSET: How long have you been feeling this way? (e.g., hours, days, weeks)     ongoing 4. SEVERITY: How would you rate the level of anxiety? (e.g., 0 - 10; or mild, moderate, severe).     Moderate/severe 5. FUNCTIONAL IMPAIRMENT: How have these feelings affected your ability to do daily activities? Have you had more difficulty than usual doing your normal daily activities? (e.g., getting better, same, worse; self-care, school, work, interactions)     Hard to tolerate immediate supervisor 6. HISTORY: Have you felt this way before? Have you ever been diagnosed with an anxiety problem in the past? (e.g., generalized anxiety disorder, panic attacks, PTSD). If Yes, ask: How was this problem treated? (e.g., medicines, counseling, etc.)     yes 7. RISK OF HARM - SUICIDAL IDEATION: Do you ever have thoughts of hurting or killing yourself? If Yes,  ask:  Do you have these feelings now? Do you have a plan on how you would do this?     yes 8. TREATMENT:  What has been done so far to treat this anxiety? (e.g., medicines, relaxation strategies). What has helped?     xanax  9. THERAPIST: Do you have a counselor or therapist? If Yes, ask: What is their name?     yes 10. POTENTIAL TRIGGERS: Do you drink caffeinated beverages (e.g., coffee,  colas, teas), and how much daily? Do you drink alcohol or use any drugs? Have you started any new medicines recently?       yes 11. PATIENT SUPPORT: Who is with you now? Who do you live with? Do you have family or friends who you can talk to?        Little family/friends 12. OTHER SYMPTOMS: Do you have any other symptoms? (e.g., feeling depressed, trouble concentrating, trouble sleeping, trouble breathing, palpitations or fast heartbeat, chest pain, sweating, nausea, or diarrhea)       Increased depression  Protocols used: Ear - Congestion-A-AH, Anxiety and Panic Attack-A-AH

## 2024-09-26 NOTE — Telephone Encounter (Signed)
 Noted

## 2024-09-28 ENCOUNTER — Encounter: Payer: Self-pay | Admitting: Family Medicine

## 2024-09-28 ENCOUNTER — Ambulatory Visit: Admitting: Family Medicine

## 2024-09-28 VITALS — BP 124/80 | HR 83 | Temp 98.9°F | Wt 288.0 lb

## 2024-09-28 DIAGNOSIS — F418 Other specified anxiety disorders: Secondary | ICD-10-CM

## 2024-09-28 DIAGNOSIS — L700 Acne vulgaris: Secondary | ICD-10-CM | POA: Insufficient documentation

## 2024-09-28 MED ORDER — DOXYCYCLINE HYCLATE 100 MG PO TABS: 100.0000 mg | ORAL_TABLET | Freq: Two times a day (BID) | ORAL | 5 refills | Status: AC

## 2024-09-28 MED ORDER — BUPROPION HCL ER (XL) 150 MG PO TB24: 150.0000 mg | ORAL_TABLET | Freq: Every day | ORAL | 2 refills | Status: AC

## 2024-09-28 NOTE — Progress Notes (Signed)
" ° °  Subjective:    Patient ID: Richard Sellers, male    DOB: 04/04/1956, 69 y.o.   MRN: [id number]  HPI Here to discuss several issues. First his depression and anxiety have been more problematic for him over the past few months. He had stopped taking Buspar  because of side effects, so currently he is only taking Xanax  4 times daily. He has been very anxious and depressed, and he gets tearful frequently. He asks us  to write him out of work for a few days so he can get himself together. He has been meeting with his therapist regularly, and he finds this to be helpful. He asks if he can try Wellbutrin  again, since he had some success with this a few years ago. Also the rash on his neck has come back, and he asks to get back on Doxycycline  for awhile.    Review of Systems  Constitutional: Negative.   Respiratory: Negative.    Cardiovascular: Negative.   Skin:  Positive for rash.  Psychiatric/Behavioral:  Positive for agitation, decreased concentration, dysphoric mood and sleep disturbance. Negative for confusion, hallucinations, self-injury and suicidal ideas. The patient is nervous/anxious.        Objective:   Physical Exam Constitutional:      Appearance: Normal appearance.  Cardiovascular:     Rate and Rhythm: Normal rate and regular rhythm.     Pulses: Normal pulses.     Heart sounds: Normal heart sounds.  Pulmonary:     Effort: Pulmonary effort is normal.     Breath sounds: Normal breath sounds.  Skin:    Comments: There is an area of erythema with papules and cysts on the back of his neck   Neurological:     Mental Status: He is alert and oriented to person, place, and time.  Psychiatric:        Behavior: Behavior normal.        Thought Content: Thought content normal.     Comments: He is anxious and tearful            Assessment & Plan:  For his depression and anxiety, he will start bacl on Wellbutrin  XL 150 mg daily. He can continue to use Xanax  as needed. I asked him  to report back in 3-4 weeks since we may need to increase the dose of Wellbutrin . We will write him out of work from 09-27-24 until 10-04-24. For the acne, he wil start back on Doxycycline  100 mg BID. Garnette Olmsted, MD   "

## 2024-09-30 ENCOUNTER — Ambulatory Visit: Admitting: Family Medicine

## 2024-09-30 ENCOUNTER — Encounter: Payer: Self-pay | Admitting: Family Medicine

## 2024-09-30 VITALS — BP 120/90 | HR 103 | Temp 98.2°F

## 2024-09-30 DIAGNOSIS — H6121 Impacted cerumen, right ear: Secondary | ICD-10-CM

## 2024-09-30 NOTE — Progress Notes (Unsigned)
 "  Established Patient Office Visit  Subjective   Patient ID: Richard Sellers, male    DOB: 1956/02/28  Age: 69 y.o. MRN: [id number]  Chief Complaint  Patient presents with   Ear Fullness    HPI  {History (Optional):23778} Richard Sellers is seen with bilateral ear fullness.  He forgot to mention this when he was here Wednesday with his primary.  He had cerumen impaction in the past.  In fact, we had seen him for this in August 2023 and removed cerumen at that time.  Denies any major dizziness.  Some mild subjective hearing change.  No ear drainage.  No ear pain.  Past Medical History:  Diagnosis Date   Allergy    seasonal allergies   Anxiety    on meds   Chronic kidney disease    hx of kidney stones   Depression    on meds   ED (erectile dysfunction)    GERD (gastroesophageal reflux disease)    not on meds-diet controlled-uses OTC meds for tx   Heart murmur    at age 49 years old, heard x1 , never again   History of kidney stones    Hyperlipidemia    on meds   Hypertension    on meds   Hypothyroidism    hx of-was on meds-   PONV (postoperative nausea and vomiting)    Warts, genital    Past Surgical History:  Procedure Laterality Date   APPENDECTOMY  1975   COLONOSCOPY  11/08/2007   per Dr. Jakie, diverticulosis but no polyps, repeat in 10 yrs    CYSTECTOMY     EXTRACORPOREAL SHOCK WAVE LITHOTRIPSY Left 10/29/2017   Procedure: LEFT EXTRACORPOREAL SHOCK WAVE LITHOTRIPSY (ESWL);  Surgeon: Matilda Senior, MD;  Location: [facility];  Service: Urology;  Laterality: Left;   HERNIA REPAIR  07/2017   umbilical hernia   KNEE ARTHROSCOPY Right 1993   LEFT HEART CATH AND CORONARY ANGIOGRAPHY N/A 02/12/2023   Procedure: LEFT HEART CATH AND CORONARY ANGIOGRAPHY;  Surgeon: Jordan, Peter M, MD;  Location: [facility] INVASIVE CV [facility];  Service: Cardiovascular;  Laterality: N/A;   TONSILLECTOMY     WISDOM TOOTH EXTRACTION  1990    reports that he quit smoking about 22 years ago. His  smoking use included cigarettes. He has never used smokeless tobacco. He reports current alcohol use of about 28.0 standard drinks of alcohol per week. He reports that he does not use drugs. family history includes Hyperlipidemia in an other family member; Hypertension in an other family member. Allergies[1]  Review of Systems  Constitutional:  Negative for chills and fever.  HENT:  Negative for ear discharge, ear pain and tinnitus.       Objective:     BP (!) 120/90   Pulse (!) 103   Temp 98.2 F (36.8 C) (Oral)   SpO2 95%  BP Readings from Last 3 Encounters:  09/30/24 (!) 120/90  09/28/24 124/80  06/17/24 (!) 128/94   Wt Readings from Last 3 Encounters:  09/28/24 288 lb (130.6 kg)  06/17/24 288 lb (130.6 kg)  04/19/24 273 lb (123.8 kg)      Physical Exam Vitals reviewed.  Constitutional:      General: He is not in acute distress.    Appearance: He is not ill-appearing.  HENT:     Ears:     Comments: Left ear canal appears normal.  Very minimal cerumen.  Eardrum appears normal.  Right canal is impacted with cerumen. Neurological:  Mental Status: He is alert.      No results found for any visits on 09/30/24.  {Labs (Optional):23779}  The ASCVD Risk score (Arnett DK, et al., 2019) failed to calculate for the following reasons:   Risk score cannot be calculated because patient has a medical history suggesting prior/existing ASCVD   * - Cholesterol units were assumed    Assessment & Plan:   Cerumen impaction right canal.  Recommend irrigation with water and patient consents.  He is aware of risk of irrigation including risk of pain, bleeding, low risk of eardrum perforation.  Wolm Scarlet, MD     [1] No Known Allergies  "

## 2024-10-03 NOTE — Progress Notes (Signed)
 Richard Sellers                                          MRN: [id number]   10/03/2024   The [facility] Quality Team Specialist reviewed this patient medical record for the purposes of chart review for care gap closure. The following were reviewed: chart review for care gap closure-controlling blood pressure.    [facility] Quality Team

## 2024-10-05 ENCOUNTER — Ambulatory Visit: Admitting: Psychology

## 2024-10-05 DIAGNOSIS — F418 Other specified anxiety disorders: Secondary | ICD-10-CM | POA: Diagnosis not present

## 2024-10-05 NOTE — Progress Notes (Signed)
 [facility] Counselor/Therapist Progress Note  Patient ID: Richard Sellers, MRN: [id number]    Date: 10/05/2024  Time Spent: 12:00 pm - 1:02 pm : 62 Minutes  Treatment Type: Individual Therapy.  Reported Symptoms: Depression & anxiety   Mental Status Exam: Appearance:  Casual     Behavior: Appropriate  Motor: Normal  Speech/Language:  Normal Rate  Affect: Congruent  Mood: dysthymic  Thought process: normal  Thought content:   WNL  Sensory/Perceptual disturbances:   WNL  Orientation: oriented to person, place, time/date, and situation  Attention: Fair  Concentration: Good  Memory: WNL  Fund of knowledge:  Good  Insight:   Good  Judgment:  Good  Impulse Control: Good   Risk Assessment: Danger to Self:  No Self-injurious Behavior: No Danger to Others: No Duty to Warn:no Physical Aggression / Violence:No  Access to Firearms a concern: No  Gang Involvement:No   Subjective:   Richard Sellers participated in the session, initially via video, from home and consented to treatment. Therapist participated from home office. Richard Sellers noted recently meeting with his medical provider, Dr. Johnny, and was prescribed Wellbutrin  150 mg every day. He noted feeling anxiety related to taking his medication and noted worry that he would experience side-effects and begin to panic. We worked on exploring this during the session. Therapist provided psycho-education regarding panic during the session. We worked on reviewing ways to manage his anxiety. Therapist reviewed various ways to manage this via challenging negative thoughts and distortions, challenging negative self-talk, engaging in relaxation, and grounding exercise. Therapist modeled challenged negative thoughts, feelings, and self-talk. Therapist provided psycho-education regarding grounding and the benefits of. Therapist provided handout, via email, for reference and review and modeled this during the session. Richard Sellers noted possible  barriers including feeling overwhelmed or not remembering to employ these tools. We worked on problem-solving during the  session including practicing the interventions and making the interventions accessible. Richard Sellers practiced the grounding techniques. Therapist encouraged Richard Sellers to take small tangible steps towards his goals of self-care and socializing, proactively manage his symptoms, and employ coping skills learned in session. Richard Sellers was engaged and motivated during the session. Therapist validated Richard Sellers's feelings and experience during the session and provided supportive therapy. A follow-up was scheduled for continued treatment, which he benefits from.   Interventions: CBT   Diagnosis:   Depression with anxiety  Psychiatric Treatment: Yes , via PCP. See chart.   Treatment Plan:  Client Abilities/Strengths Richard Sellers forthcoming and motivated for change.   Support System: Friends.   Client Treatment Preferences Outpatient therapy.   Client Statement of Needs Richard Sellers would like to manage my anxiety, depression, and not wanting to leave the home. Increase socialization. Additional goals include increasing mindfulness, engage in consistent self-care, proactive symptom management, develop a daily routine, managing rumination, and challenge negative self-talk and distortions.   Treatment Level Weekly  Symptoms  Anxiety: Feeling anxious, difficulty managing worry, worrying about different things, trouble relaxing, restlessness, irritability, feeling afraid something awful might happen.     (Status: maintained) Depression:  Loss of interest, feel down, poor sleep, lethargy, feeling bad about self, trouble concentration.  (Status: maintained)  Goals:   Richard Sellers experiences symptoms of depression and anxiety.  Treatment plan signed and available on s-drive:  No, pending signature via MyChart.  Richard Sellers was sent the treatment plan signature form on 03/01/24.   Target Date: 03/01/25  Frequency: Weekly  Progress: 0 Modality: individual    Therapist will provide referrals for additional  resources as appropriate.  Therapist will provide psycho-education regarding [name]'s diagnosis and corresponding treatment approaches and interventions. Richard Mullet, LCSW will support the patient's ability to achieve the goals identified. will employ CBT, BA, Problem-solving, Solution Focused, Mindfulness,  coping skills, & other evidenced-based practices will be used to promote progress towards healthy functioning to help manage decrease symptoms associated with his diagnosis.   Reduce overall level, frequency, and intensity of the feelings of depression & anxiety evidenced by decreased overall from 6 to 7 days/week to 0 to 1 days/week per client report for at least 3 consecutive months. Verbally express understanding of the relationship between feelings of depression, anxiety and their impact on thinking patterns and behaviors. Verbalize an understanding of the role that distorted thinking plays in creating fears, excessive worry, and ruminations.  Richard Sellers participated in the creation of the treatment plan)  Richard Mullet, LCSW

## 2024-10-18 ENCOUNTER — Ambulatory Visit: Admitting: Psychology

## 2024-10-24 ENCOUNTER — Other Ambulatory Visit: Payer: Self-pay | Admitting: Physician Assistant

## 2024-10-24 ENCOUNTER — Encounter: Payer: Self-pay | Admitting: Family Medicine

## 2024-10-24 ENCOUNTER — Other Ambulatory Visit: Payer: Self-pay

## 2024-10-24 ENCOUNTER — Emergency Department (HOSPITAL_COMMUNITY)
Admission: EM | Admit: 2024-10-24 | Discharge: 2024-10-24 | Disposition: A | Discharge status: Home / Self Care | Source: Home / Self Care | Attending: Emergency Medicine | Admitting: Emergency Medicine

## 2024-10-24 ENCOUNTER — Emergency Department (HOSPITAL_COMMUNITY)

## 2024-10-24 ENCOUNTER — Ambulatory Visit: Payer: Self-pay

## 2024-10-24 DIAGNOSIS — R079 Chest pain, unspecified: Secondary | ICD-10-CM

## 2024-10-24 DIAGNOSIS — R0789 Other chest pain: Secondary | ICD-10-CM

## 2024-10-24 LAB — CBC
HCT: 42.9 % (ref 39.0–52.0)
Hemoglobin: 14.5 g/dL (ref 13.0–17.0)
MCH: 32.3 pg (ref 26.0–34.0)
MCHC: 33.8 g/dL (ref 30.0–36.0)
MCV: 95.5 fL (ref 80.0–100.0)
Platelets: 287 10*3/uL (ref 150–400)
RBC: 4.49 MIL/uL (ref 4.22–5.81)
RDW: 13.6 % (ref 11.5–15.5)
WBC: 11.3 10*3/uL — ABNORMAL HIGH (ref 4.0–10.5)
nRBC: 0 % (ref 0.0–0.2)

## 2024-10-24 LAB — BASIC METABOLIC PANEL WITH GFR
Anion gap: 14 (ref 5–15)
BUN: 13 mg/dL (ref 8–23)
CO2: 19 mmol/L — ABNORMAL LOW (ref 22–32)
Calcium: 8.9 mg/dL (ref 8.9–10.3)
Chloride: 106 mmol/L (ref 98–111)
Creatinine, Ser: 1.36 mg/dL — ABNORMAL HIGH (ref 0.61–1.24)
GFR, Estimated: 57 mL/min — ABNORMAL LOW
Glucose, Bld: 143 mg/dL — ABNORMAL HIGH (ref 70–99)
Potassium: 4.6 mmol/L (ref 3.5–5.1)
Sodium: 138 mmol/L (ref 135–145)

## 2024-10-24 LAB — TROPONIN T, HIGH SENSITIVITY
Troponin T High Sensitivity: 44 ng/L — ABNORMAL HIGH (ref 0–19)
Troponin T High Sensitivity: 41 ng/L — ABNORMAL HIGH (ref 0–19)

## 2024-10-24 LAB — D-DIMER, QUANTITATIVE: D-Dimer, Quant: 0.46 ug{FEU}/mL (ref 0.00–0.50)

## 2024-10-24 MED ORDER — ALPRAZOLAM 0.5 MG PO TABS
1.0000 mg | ORAL_TABLET | Freq: Once | ORAL | Status: AC
  Administered 2024-10-24: 1 mg via ORAL
  Filled 2024-10-24: qty 4

## 2024-10-24 MED ORDER — DILTIAZEM HCL ER COATED BEADS 180 MG PO CP24: 180.0000 mg | ORAL_CAPSULE | Freq: Every day | ORAL | 1 refills | Status: AC

## 2024-10-24 NOTE — ED Notes (Signed)
 Xanax  administered PO. Pt resting comfortably in a chair at the bedside.  \

## 2024-10-24 NOTE — ED Triage Notes (Signed)
 PT BIB [facility] from home, PT c/c chest pain that began at 0200 this am. Pain unresolved and PT received 324mg  aspirin  and one dose of nitroglycerin  with EMS. PT states he took xanax  this morning also PRN, also endorses being unable to take various home meds for 2-3 days, losartaan and plavix . endorses 2/10 pain/ heaviness in chest currently. Denies SOB.  BP 140/90. HR 100, Spo2 100% RA,

## 2024-10-24 NOTE — Telephone Encounter (Addendum)
 FYI Only or Action Required?: FYI only for provider: 911 called .  Patient was last seen in primary care on 09/30/2024 by Micheal Wolm ORN, MD.  Called Nurse Triage reporting Chest Pain (heaviness).  Symptoms began 0230 this am .  Interventions attempted: Nothing.  Symptoms are: gradually worsening.  Triage Disposition: Call EMS 911 Now- coworker who spoke with 911 was advised to tell this NT to disconnect with pt so they can call him. Advised pt the same information and that 911 will call him. Pt verbalized understanding.  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 Patient also states he hasn't taken his clopidogrel  (PLAVIX ) 75 MG tablet for 2-3 days and the losartan  (COZAAR ) 50 MG tablet for 1 day just hasn't been able to get it due to weather not sure if that's what's causing the symptoms. Not sure if he's having SOB from nerves, if he moves around gets tired really easy.    Reason for Disposition  [1] Chest pain lasts > 5 minutes AND [2] age > 24  Answer Assessment - Initial Assessment Questions 1. LOCATION: Where does it hurt?       Woke up with heavy feeling in chest  2. RADIATION: Does the pain go anywhere else? (e.g., into neck, jaw, arms, back)     no 3. ONSET: When did the chest pain begin? (Minutes, hours or days)      0230 this am woke pt up  4. PATTERN: Does the pain come and go, or has it been constant since it started?  Does it get worse with exertion?      Constant  5. DURATION: How long does it last (e.g., seconds, minutes, hours)     Constant  6. SEVERITY: How bad is the pain?  (e.g., Scale 1-10; mild, moderate, or severe)     heaviness 7. CARDIAC RISK FACTORS: Do you have any history of heart problems or risk factors for heart disease? (e.g., angina, prior heart  attack; diabetes, high blood pressure, high cholesterol, smoker, or strong family history of heart disease)     Previous MI and stroke, HTN 8. PULMONARY RISK FACTORS: Do you have any history of lung disease?  (e.g., blood clots in lung, asthma, emphysema, birth control pills)     None per chart  9. CAUSE: What do you think is causing the chest pain?     H/o heart attack with same heavyfeeling in chest  10. OTHER SYMPTOMS: Do you have any other symptoms? (e.g., dizziness, nausea, vomiting, sweating, fever, difficulty breathing, cough)       Nausea, anxious  Protocols used: Chest Pain-A-AH

## 2024-10-24 NOTE — ED Notes (Signed)
 Tropnin collected. Unable to use [employer] due to equipment malfunction

## 2024-10-24 NOTE — ED Notes (Signed)
 Cardiology at the bedside.

## 2024-10-24 NOTE — Discharge Instructions (Addendum)
 Thank you for coming to [facility] Emergency Department. You were seen for chest pain. We did an exam, labs, and imaging, and these showed no acute findings. You were seen by cardiology in the ED who suggested starting diltiazem  180 mg once per day for high heart rate as well as your chest pain.   Please follow up with your cardiologist Dr. Sheena within 1 week. You may have a stress test as an outpatient.   Do not hesitate to return to the ED or call 911 if you experience: -Worsening symptoms -Severe chest pain or shortness of breath -Severe nausea/vomiting -Lightheadedness, passing out -Fevers/chills -Anything else that concerns you

## 2024-10-25 ENCOUNTER — Telehealth: Payer: Self-pay | Admitting: *Deleted

## 2024-10-25 NOTE — Telephone Encounter (Signed)
 Pt was seen at the ED on 10/24/24 for this problem

## 2024-10-25 NOTE — Telephone Encounter (Signed)
"   RN called and spoke to patient. Patient verbalized understanding. He stated he can not do the test on a Monday. RN informed the patient to let scheduling know when they call him    Instruction for the test has been sent via MyChart to the patient.     "

## 2024-10-26 ENCOUNTER — Ambulatory Visit: Admitting: Nurse Practitioner

## 2024-10-26 DIAGNOSIS — I2510 Atherosclerotic heart disease of native coronary artery without angina pectoris: Secondary | ICD-10-CM

## 2024-10-26 DIAGNOSIS — E785 Hyperlipidemia, unspecified: Secondary | ICD-10-CM

## 2024-10-26 DIAGNOSIS — R001 Bradycardia, unspecified: Secondary | ICD-10-CM

## 2024-10-26 DIAGNOSIS — F1090 Alcohol use, unspecified, uncomplicated: Secondary | ICD-10-CM

## 2024-10-26 DIAGNOSIS — I5032 Chronic diastolic (congestive) heart failure: Secondary | ICD-10-CM

## 2024-10-26 DIAGNOSIS — E039 Hypothyroidism, unspecified: Secondary | ICD-10-CM

## 2024-10-26 DIAGNOSIS — F418 Other specified anxiety disorders: Secondary | ICD-10-CM

## 2024-10-26 DIAGNOSIS — I639 Cerebral infarction, unspecified: Secondary | ICD-10-CM

## 2024-10-26 DIAGNOSIS — I10 Essential (primary) hypertension: Secondary | ICD-10-CM

## 2024-10-26 DIAGNOSIS — N1831 Chronic kidney disease, stage 3a: Secondary | ICD-10-CM

## 2024-10-28 ENCOUNTER — Other Ambulatory Visit: Payer: Self-pay | Admitting: Family Medicine

## 2024-10-28 NOTE — Addendum Note (Signed)
 Addended by: RANDY HAMP SAILOR on: 10/28/2024 09:54 AM   Modules accepted: Orders

## 2024-10-28 NOTE — Telephone Encounter (Signed)
 Removed Losartan  from pt medication list.  Per provider note.

## 2024-11-01 ENCOUNTER — Ambulatory Visit: Admitting: Psychology

## 2024-11-22 ENCOUNTER — Ambulatory Visit: Admitting: Nurse Practitioner

## 2024-12-26 ENCOUNTER — Ambulatory Visit
